# Patient Record
Sex: Female | Born: 1935
Health system: Southern US, Community
[De-identification: ages and names within clinical notes are randomized; demographics above are authoritative.]

## PROBLEM LIST (undated history)

## (undated) DIAGNOSIS — R2689 Other abnormalities of gait and mobility: Secondary | ICD-10-CM

## (undated) DIAGNOSIS — K219 Gastro-esophageal reflux disease without esophagitis: Secondary | ICD-10-CM

## (undated) DIAGNOSIS — Z5181 Encounter for therapeutic drug level monitoring: Secondary | ICD-10-CM

## (undated) DIAGNOSIS — M1990 Unspecified osteoarthritis, unspecified site: Secondary | ICD-10-CM

## (undated) DIAGNOSIS — Z950 Presence of cardiac pacemaker: Secondary | ICD-10-CM

## (undated) DIAGNOSIS — I959 Hypotension, unspecified: Secondary | ICD-10-CM

## (undated) DIAGNOSIS — R0600 Dyspnea, unspecified: Secondary | ICD-10-CM

## (undated) DIAGNOSIS — I480 Paroxysmal atrial fibrillation: Secondary | ICD-10-CM

## (undated) DIAGNOSIS — I2510 Atherosclerotic heart disease of native coronary artery without angina pectoris: Secondary | ICD-10-CM

## (undated) DIAGNOSIS — I5032 Chronic diastolic (congestive) heart failure: Secondary | ICD-10-CM

## (undated) DIAGNOSIS — I10 Essential (primary) hypertension: Secondary | ICD-10-CM

## (undated) DIAGNOSIS — Z7901 Long term (current) use of anticoagulants: Secondary | ICD-10-CM

## (undated) DIAGNOSIS — E785 Hyperlipidemia, unspecified: Secondary | ICD-10-CM

## (undated) DIAGNOSIS — E669 Obesity, unspecified: Secondary | ICD-10-CM

## (undated) DIAGNOSIS — I639 Cerebral infarction, unspecified: Secondary | ICD-10-CM

## (undated) HISTORY — DX: Cerebral infarction, unspecified: I63.9

## (undated) HISTORY — PX: SPLENECTOMY: SUR1306

## (undated) HISTORY — DX: Dyspnea, unspecified: R06.00

## (undated) HISTORY — DX: Obesity, unspecified: E66.9

## (undated) HISTORY — DX: Essential (primary) hypertension: I10

## (undated) HISTORY — PX: ABDOMINAL HYSTERECTOMY: SHX81

## (undated) HISTORY — PX: INSERT / REPLACE / REMOVE PACEMAKER: SUR710

## (undated) HISTORY — PX: CARDIAC CATHETERIZATION: SHX172

## (undated) HISTORY — DX: Presence of cardiac pacemaker: Z95.0

## (undated) HISTORY — DX: Gastro-esophageal reflux disease without esophagitis: K21.9

## (undated) HISTORY — PX: BREAST LUMPECTOMY: SHX2

## (undated) HISTORY — DX: Hyperlipidemia, unspecified: E78.5

## (undated) HISTORY — DX: Paroxysmal atrial fibrillation: I48.0

---

## 1972-02-01 HISTORY — PX: TOTAL ABDOMINAL HYSTERECTOMY W/ BILATERAL SALPINGOOPHORECTOMY: SHX83

## 1989-01-31 HISTORY — PX: COLECTOMY: SHX59

## 2000-04-11 ENCOUNTER — Encounter: Admission: RE | Admit: 2000-04-11 | Discharge: 2000-04-11 | Payer: Self-pay | Admitting: *Deleted

## 2000-04-11 ENCOUNTER — Encounter: Payer: Self-pay | Admitting: *Deleted

## 2000-05-01 ENCOUNTER — Inpatient Hospital Stay (HOSPITAL_COMMUNITY): Admission: EM | Admit: 2000-05-01 | Discharge: 2000-05-02 | Payer: Self-pay | Admitting: Emergency Medicine

## 2000-05-01 ENCOUNTER — Encounter: Payer: Self-pay | Admitting: Emergency Medicine

## 2000-05-10 ENCOUNTER — Encounter: Admission: RE | Admit: 2000-05-10 | Discharge: 2000-05-10 | Payer: Self-pay | Admitting: *Deleted

## 2000-05-10 ENCOUNTER — Encounter: Payer: Self-pay | Admitting: *Deleted

## 2000-08-17 ENCOUNTER — Encounter: Payer: Self-pay | Admitting: *Deleted

## 2000-08-17 ENCOUNTER — Ambulatory Visit (HOSPITAL_COMMUNITY): Admission: RE | Admit: 2000-08-17 | Discharge: 2000-08-17 | Payer: Self-pay | Admitting: *Deleted

## 2001-02-28 ENCOUNTER — Ambulatory Visit (HOSPITAL_COMMUNITY): Admission: RE | Admit: 2001-02-28 | Discharge: 2001-02-28 | Payer: Self-pay | Admitting: *Deleted

## 2001-02-28 ENCOUNTER — Encounter (INDEPENDENT_AMBULATORY_CARE_PROVIDER_SITE_OTHER): Payer: Self-pay | Admitting: Specialist

## 2001-03-08 ENCOUNTER — Encounter: Payer: Self-pay | Admitting: Ophthalmology

## 2001-03-13 ENCOUNTER — Ambulatory Visit (HOSPITAL_COMMUNITY): Admission: RE | Admit: 2001-03-13 | Discharge: 2001-03-14 | Payer: Self-pay | Admitting: Ophthalmology

## 2002-04-22 ENCOUNTER — Inpatient Hospital Stay (HOSPITAL_COMMUNITY): Admission: EM | Admit: 2002-04-22 | Discharge: 2002-04-23 | Payer: Self-pay | Admitting: Emergency Medicine

## 2002-04-22 ENCOUNTER — Encounter: Payer: Self-pay | Admitting: Emergency Medicine

## 2003-04-03 ENCOUNTER — Inpatient Hospital Stay (HOSPITAL_COMMUNITY): Admission: AC | Admit: 2003-04-03 | Discharge: 2003-04-15 | Payer: Self-pay

## 2003-04-03 ENCOUNTER — Encounter (INDEPENDENT_AMBULATORY_CARE_PROVIDER_SITE_OTHER): Payer: Self-pay | Admitting: Specialist

## 2003-06-26 ENCOUNTER — Emergency Department (HOSPITAL_COMMUNITY): Admission: EM | Admit: 2003-06-26 | Discharge: 2003-06-26 | Payer: Self-pay | Admitting: *Deleted

## 2004-05-13 ENCOUNTER — Emergency Department (HOSPITAL_COMMUNITY): Admission: EM | Admit: 2004-05-13 | Discharge: 2004-05-13 | Payer: Self-pay | Admitting: Emergency Medicine

## 2007-10-04 ENCOUNTER — Ambulatory Visit: Payer: Self-pay | Admitting: Cardiovascular Disease

## 2007-10-05 ENCOUNTER — Inpatient Hospital Stay (HOSPITAL_COMMUNITY): Admission: EM | Admit: 2007-10-05 | Discharge: 2007-10-06 | Payer: Self-pay | Admitting: Emergency Medicine

## 2007-10-09 ENCOUNTER — Ambulatory Visit: Payer: Self-pay | Admitting: Cardiology

## 2007-10-09 ENCOUNTER — Ambulatory Visit: Payer: Self-pay | Admitting: Internal Medicine

## 2007-10-12 ENCOUNTER — Ambulatory Visit: Payer: Self-pay | Admitting: Internal Medicine

## 2007-10-24 ENCOUNTER — Ambulatory Visit: Payer: Self-pay | Admitting: Cardiology

## 2007-10-24 LAB — CONVERTED CEMR LAB
CO2: 30 meq/L (ref 19–32)
Calcium: 10.1 mg/dL (ref 8.4–10.5)
Creatinine, Ser: 1 mg/dL (ref 0.4–1.2)
GFR calc Af Amer: 68 mL/min
GFR calc non Af Amer: 56 mL/min
Sodium: 141 meq/L (ref 135–145)

## 2007-12-11 ENCOUNTER — Ambulatory Visit: Payer: Self-pay | Admitting: Internal Medicine

## 2007-12-19 ENCOUNTER — Ambulatory Visit: Payer: Self-pay

## 2008-06-04 ENCOUNTER — Encounter: Payer: Self-pay | Admitting: Internal Medicine

## 2008-06-04 ENCOUNTER — Ambulatory Visit: Payer: Self-pay | Admitting: Internal Medicine

## 2008-06-04 DIAGNOSIS — I482 Chronic atrial fibrillation, unspecified: Secondary | ICD-10-CM | POA: Insufficient documentation

## 2008-06-04 DIAGNOSIS — R0602 Shortness of breath: Secondary | ICD-10-CM | POA: Insufficient documentation

## 2008-06-04 DIAGNOSIS — E785 Hyperlipidemia, unspecified: Secondary | ICD-10-CM

## 2008-06-04 DIAGNOSIS — I10 Essential (primary) hypertension: Secondary | ICD-10-CM

## 2008-06-10 ENCOUNTER — Ambulatory Visit: Payer: Self-pay | Admitting: Internal Medicine

## 2008-06-23 ENCOUNTER — Encounter: Payer: Self-pay | Admitting: Internal Medicine

## 2008-06-23 ENCOUNTER — Ambulatory Visit: Payer: Self-pay | Admitting: Internal Medicine

## 2008-07-11 ENCOUNTER — Ambulatory Visit: Payer: Self-pay | Admitting: Internal Medicine

## 2008-07-14 ENCOUNTER — Telehealth: Payer: Self-pay | Admitting: Internal Medicine

## 2008-07-14 LAB — CONVERTED CEMR LAB
BUN: 9 mg/dL (ref 6–23)
Chloride: 105 meq/L (ref 96–112)
INR: 2.3 — ABNORMAL HIGH (ref 0.0–1.5)
Platelets: 372 10*3/uL (ref 150–400)
Potassium: 3.8 meq/L (ref 3.5–5.3)
Prothrombin Time: 27 s — ABNORMAL HIGH (ref 11.6–15.2)
RBC: 4.9 M/uL (ref 3.87–5.11)
Sodium: 143 meq/L (ref 135–145)
WBC: 9.1 10*3/uL (ref 4.0–10.5)

## 2008-07-16 ENCOUNTER — Ambulatory Visit: Payer: Self-pay | Admitting: Internal Medicine

## 2008-07-16 ENCOUNTER — Inpatient Hospital Stay (HOSPITAL_COMMUNITY): Admission: RE | Admit: 2008-07-16 | Discharge: 2008-07-17 | Payer: Self-pay | Admitting: Internal Medicine

## 2008-07-17 ENCOUNTER — Encounter: Payer: Self-pay | Admitting: Internal Medicine

## 2008-07-21 ENCOUNTER — Encounter: Payer: Self-pay | Admitting: Internal Medicine

## 2008-07-28 ENCOUNTER — Encounter: Payer: Self-pay | Admitting: Internal Medicine

## 2008-07-28 ENCOUNTER — Ambulatory Visit: Payer: Self-pay | Admitting: Internal Medicine

## 2008-07-29 LAB — CONVERTED CEMR LAB
Hemoglobin: 14.9 g/dL (ref 12.0–15.0)
MCHC: 32.2 g/dL (ref 30.0–36.0)
MCV: 100 fL (ref 78.0–100.0)
RBC: 4.63 M/uL (ref 3.87–5.11)
WBC: 12.3 10*3/uL — ABNORMAL HIGH (ref 4.0–10.5)

## 2008-10-20 ENCOUNTER — Ambulatory Visit: Payer: Self-pay | Admitting: Internal Medicine

## 2008-10-20 DIAGNOSIS — Z950 Presence of cardiac pacemaker: Secondary | ICD-10-CM | POA: Insufficient documentation

## 2008-10-20 DIAGNOSIS — T827XXA Infection and inflammatory reaction due to other cardiac and vascular devices, implants and grafts, initial encounter: Secondary | ICD-10-CM

## 2008-11-10 ENCOUNTER — Ambulatory Visit: Payer: Self-pay | Admitting: Internal Medicine

## 2009-02-03 ENCOUNTER — Ambulatory Visit: Payer: Self-pay | Admitting: Internal Medicine

## 2009-02-03 DIAGNOSIS — I495 Sick sinus syndrome: Secondary | ICD-10-CM

## 2009-02-03 DIAGNOSIS — M25519 Pain in unspecified shoulder: Secondary | ICD-10-CM

## 2009-07-06 ENCOUNTER — Telehealth: Payer: Self-pay | Admitting: Internal Medicine

## 2009-07-07 ENCOUNTER — Ambulatory Visit: Payer: Self-pay | Admitting: Internal Medicine

## 2009-07-07 DIAGNOSIS — R072 Precordial pain: Secondary | ICD-10-CM

## 2009-07-07 DIAGNOSIS — I6350 Cerebral infarction due to unspecified occlusion or stenosis of unspecified cerebral artery: Secondary | ICD-10-CM | POA: Insufficient documentation

## 2009-07-09 ENCOUNTER — Telehealth: Payer: Self-pay | Admitting: Internal Medicine

## 2009-07-12 ENCOUNTER — Emergency Department (HOSPITAL_COMMUNITY): Admission: EM | Admit: 2009-07-12 | Discharge: 2009-07-12 | Payer: Self-pay | Admitting: Emergency Medicine

## 2009-07-20 ENCOUNTER — Telehealth: Payer: Self-pay | Admitting: Internal Medicine

## 2009-07-30 ENCOUNTER — Ambulatory Visit: Payer: Self-pay

## 2009-07-30 ENCOUNTER — Ambulatory Visit (HOSPITAL_COMMUNITY): Admission: RE | Admit: 2009-07-30 | Discharge: 2009-07-30 | Payer: Self-pay | Admitting: Internal Medicine

## 2009-07-30 ENCOUNTER — Encounter: Payer: Self-pay | Admitting: Internal Medicine

## 2009-07-30 ENCOUNTER — Ambulatory Visit: Payer: Self-pay | Admitting: Cardiology

## 2009-08-06 ENCOUNTER — Encounter: Payer: Self-pay | Admitting: Internal Medicine

## 2010-01-06 ENCOUNTER — Encounter (INDEPENDENT_AMBULATORY_CARE_PROVIDER_SITE_OTHER): Payer: Self-pay | Admitting: *Deleted

## 2010-02-22 ENCOUNTER — Ambulatory Visit: Admit: 2010-02-22 | Payer: Self-pay | Admitting: Internal Medicine

## 2010-03-02 NOTE — Cardiovascular Report (Signed)
Summary: Office Note   Office Note   Imported By: Roderic Ovens 07/14/2009 13:25:29  _____________________________________________________________________  External Attachment:    Type:   Image     Comment:   External Document

## 2010-03-02 NOTE — Assessment & Plan Note (Signed)
Summary: rov   Primary Provider:  Formerly Joetta Manners  CC:  rov/ pt reports SOB and that she has been having pain in the left side of her head.  She lost vision a couple weeks ago and the CT showed a "possible stroke".  History of Present Illness: Ms. Agent is a delightful 77 year old woman with a history of syncope in the setting of atrial fibrillation with probable tachybrady syndrome s/p pacemaker placement in 2010, chronic dyspnea as well as hypertension and gastroesophageal reflux disease. She returns for f/u.  Last year we decreased her bisprolol due to resting breadycardia and ordered a Myoview to further evaluate her chronic dyspnea. Myoview low risk. Had event monitor 6 months ago with PACs but no significant arrhythmias or pauses.   Recently had an episode of severe dizziness and decreased vision.  Saw PCP who did CT of brain. Was told she had had a stroke (did not say anything about timing). Referred for MRI but couldn't get it due to pacemaker. Had appointment with Neurology but she cancelled. Has not had carotid u/s. Still with occasional HAs. Says L arm always weaker than her right due to "pulled muscle: in her back a long time ago.   Rare episode of CP. Continues with chronic severe dyspnea asking for a scooter.  Current Medications (verified): 1)  Klor-Con M20 20 Meq Cr-Tabs (Potassium Chloride Crys Cr) .[name].. 1 By Mouth Daily As Needed 2)  Aspirin 81 Mg Tbec (Aspirin) .... Take One Tablet By Mouth Every Other Day When Remember 3)  Simvastatin 40 Mg Tabs (Simvastatin) .... Take One Tablet By Mouth Daily At Bedtime 4)  Bisoprolol Fumarate 5 Mg Tabs (Bisoprolol Fumarate) .[name].. 1 By Mouth Daily 5)  Lisinopril-Hydrochlorothiazide 10-12.5 Mg Tabs (Lisinopril-Hydrochlorothiazide) .[name].. 1 By Mouth Once Daily 6)  Warfarin Sodium 4 Mg Tabs (Warfarin Sodium) .... Use As Directed By Anticoagulation Clinic 7)  Fish Oil 1000 Mg Caps (Omega-3 Fatty Acids) .[name].. 1 By Mouth Once in  While  Allergies (verified): No Known Drug Allergies  Past History:  Past Medical History: Last updated: 06/04/2008 1. Paroxysmal atrial fibrillation c/b tachy-brady syndrome 2. HTN 3. Hyperlipidemia 4. GERD 5. CP with cath 2002 showing mild non-obstuctive CAD 6. Osteoporosis 7. Chronic dyspnea    --Myoview 11/09. EF 81% probable soft-tissue attenuation in anterior and lat walls.         can't exclude ischemia. 8. Obesity    Review of Systems       As per HPI and past medical history; otherwise all systems negative.   Vital Signs:  Patient profile:   77 year old female Height:      64 inches Weight:      189 pounds BMI:     32.56 Pulse rate:   60 / minute Pulse rhythm:   regular BP sitting:   132 / 76  (left arm) Cuff size:   regular  Vitals Entered By: Judithe Modest CMA (July 07, 2009 10:58 AM)  Physical Exam  General:  Elderly. +diffuse  tremor. No acute distress. HEENT: normal except for scar from skin cancer removal ont he bridge of her nose Neck: supple. no JVD. Carotids 2+ bilat; no bruits. No lymphadenopathy or thryomegaly appreciated. Cor: PMI nondisplaced. Regular rate & rhythm. No rubs, gallops, murmur. Back + scoliosis/kyphosis. Lungs: clear Abdomen: soft, nontender, nondistended.  Good bowel sounds. Extremities: no cyanosis, clubbing, rash, edema Neuro: alert & orientedx3, cranial nerves grossly intact. moves all 4 extremities w/o difficulty mildly weak grip in  L arm. no drift. affect pleasant    PPM Specifications Following MD:  Sherryl Manges, MD     PPM Vendor:  St Jude     PPM Model Number:  [id number]     PPM Serial Number:  [id number] PPM DOI:  07/16/2008     PPM Implanting MD:  Sherryl Manges, MD  Lead 1    Location: RA     DOI: 07/16/2008     Model #: [id number]     Serial #: [id number]     Status: active Lead 2    Location: RV     DOI: 07/16/2008     Model #: 1948     Serial #: [id number]     Status: active  Magnet Response Rate:  BOL 100 ERI   85    PPM Follow Up Battery Voltage:  2.98 V     Battery Est. Longevity:  9.7-11.65yrs     Pacer Dependent:  No       PPM Device Measurements Atrium  Amplitude: 2.9 mV, Impedance: 410 ohms, Threshold: 1.125 V at 0.4 msec Right Ventricle  Amplitude: 12.0 mV, Impedance: 700 ohms, Threshold: 0.75 V at 0.4 msec  Episodes MS Episodes:  20     Percent Mode Switch:  <1%     Coumadin:  Yes Ventricular High Rate:  0     Atrial Pacing:  65%     Ventricular Pacing:  <1%  Parameters Mode:  DDDR     Lower Rate Limit:  60     Upper Rate Limit:  120 Paced AV Delay:  200     Sensed AV Delay:  150 Tech Comments:  PT SEEING DR Zael Shuman--NORMAL DEVICE FUNCTION.  HISTOGRAMS FLAT--TURNED RATE RESPONSE ON DUE TO PT COMPLAINING OF SOB AND FATIGUE W/ACTIVITY.  LONGEST MODE SWITCH WAS 6 HRS IN DECEMBER.  LAST MODE SWITCH 06-27-09. Vella Kohler  July 07, 2009 11:31 AM  Impression & Recommendations:  Problem # 1:  CVA (ICD-434.91) Will check carotid u/s and arrange evaluation with [facility]. Also check echo.   Problem # 2:  ATRIAL FIBRILLATION (ICD-427.31) Continue coumadin. Has pacer in place for SSS.  Problem # 3:  CHEST PAIN-PRECORDIAL (ICD-786.51) Atypical. Recent Myoview reassuring. no further w/u at this time.  Problem # 4:  DYSPNEA (ICD-786.05) Assessment: Deteriorated Chronic. Severe. No clear reversible process. She is atrial pacing pacing on her pacer 65% of the time and we turned on rate response.   She is asking about an electirc scooter and given her degree of limitation I do think this is reasonable to help her mobility and preserve QOL.  Other Orders: EKG w/ Interpretation (93000) Neurology Referral (Neuro) Carotid Duplex (Carotid Duplex) Echocardiogram (Echo)  Patient Instructions: 1)  Your physician wants you to follow-up in:   9 MONTHS  You will receive a reminder letter in the mail two months in advance. If you don't receive a letter, please call our office to  schedule the follow-up appointment. 2)  You have been referred to [facility] 3)  Your physician has requested that you have a carotid duplex. This test is an ultrasound of the carotid arteries in your neck. It looks at blood flow through these arteries that supply the brain with blood. Allow one hour for this exam. There are no restrictions or special instructions. 4)  Your physician has requested that you have an echocardiogram.  Echocardiography is a painless test that uses sound waves to create images of your heart. It  provides your doctor with information about the size and shape of your heart and how well your heart's chambers and valves are working.  This procedure takes approximately one hour. There are no restrictions for this procedure.

## 2010-03-02 NOTE — Assessment & Plan Note (Signed)
Summary: f34m   Visit Type:  Follow-up Primary Provider:  Joetta Manners  CC:  SOB and chest pain on occasion.  History of Present Illness: [name] is a delightful 77 year old woman with a history of syncope in the setting of atrial fibrillation with probable tachybrady syndrome,.  she is status post pacemaker implantation for chromotropic incompetence. This was accomplished in June. About a month later apparently she had some drainage from the site. She went to go see her primary care physician Dr. Carolyne Fiscal who put her on an antibiotic with subsequent resolution. Her granddaughter, [name]  of EP fame, looked at located apparently. We saw her about 4 weeks ago that  is about 5 weeks after the above. The wound has continued to heal there has been no further drainage.  Pacemaker site a little sore when she pushes on it. No drainage. No fevers or chiils. Chronic dyspnea which she says is improved since having her pacer. Main complaint is pulled muscle in L shoulder and back. Has had chronic problem with injury to her shoulder 30 years ago and it occasionally flares up. No palpitations, presyncope and syncope. No bleeding with coumadin.  Medications Prior to Update: 1)  Klor-Con M20 20 Meq Cr-Tabs (Potassium Chloride Crys Cr) .[name].. 1 By Mouth Daily As Needed 2)  Aspirin 81 Mg Tbec (Aspirin) .... Take One Tablet By Mouth Every Other Day When Remember 3)  Simvastatin 40 Mg Tabs (Simvastatin) .... Take One Tablet By Mouth Daily At Bedtime 4)  Bisoprolol Fumarate 5 Mg Tabs (Bisoprolol Fumarate) .[name].. 1 By Mouth Daily 5)  Lisinopril-Hydrochlorothiazide 10-12.5 Mg Tabs (Lisinopril-Hydrochlorothiazide) .[name].. 1 By Mouth Once Daily 6)  Warfarin Sodium 4 Mg Tabs (Warfarin Sodium) .... Use As Directed By Anticoagulation Clinic 7)  Fish Oil 1000 Mg Caps (Omega-3 Fatty Acids) .[name].. 1 By Mouth Once in While  Current Medications (verified): 1)  Klor-Con M20 20 Meq Cr-Tabs (Potassium Chloride Crys Cr) .[name].. 1 By  Mouth Daily As Needed 2)  Aspirin 81 Mg Tbec (Aspirin) .... Take One Tablet By Mouth Every Other Day When Remember 3)  Simvastatin 40 Mg Tabs (Simvastatin) .... Take One Tablet By Mouth Daily At Bedtime 4)  Bisoprolol Fumarate 5 Mg Tabs (Bisoprolol Fumarate) .[name].. 1 By Mouth Daily 5)  Lisinopril-Hydrochlorothiazide 10-12.5 Mg Tabs (Lisinopril-Hydrochlorothiazide) .[name].. 1 By Mouth Once Daily 6)  Warfarin Sodium 4 Mg Tabs (Warfarin Sodium) .... Use As Directed By Anticoagulation Clinic 7)  Fish Oil 1000 Mg Caps (Omega-3 Fatty Acids) .[name].. 1 By Mouth Once in While  Allergies (verified): No Known Drug Allergies  Past History:  Past Medical History: Last updated: 06/04/2008 1. Paroxysmal atrial fibrillation c/b tachy-brady syndrome 2. HTN 3. Hyperlipidemia 4. GERD 5. CP with cath 2002 showing mild non-obstuctive CAD 6. Osteoporosis 7. Chronic dyspnea    --Myoview 11/09. EF 81% probable soft-tissue attenuation in anterior and lat walls.         can't exclude ischemia. 8. Obesity    Review of Systems       As per HPI and past medical history; otherwise all systems negative.   Vital Signs:  Patient profile:   77 year old female Weight:      188.50 pounds Pulse rate:   60 / minute Pulse rhythm:   regular BP sitting:   124 / 70  (left arm) Cuff size:   regular  Vitals Entered By: Marilyn Cross, RN, BSN (February 03, 2009 2:02 PM)  Physical Exam  General:  Elderly. + tremor.  No acute distress. HEENT: normal Neck: supple. no JVD. Carotids 2+ bilat; no bruits. No lymphadenopathy or thryomegaly appreciated. Cor: PMI nondisplaced. Regular rate & rhythm. No rubs, gallops, murmur. Back + scoliosis. tender over left scapula Lungs: clear Abdomen: soft, nontender, nondistended.  Good bowel sounds. Extremities: no cyanosis, clubbing, rash, edema Neuro: alert & orientedx3, cranial nerves grossly intact. moves all 4 extremities w/o difficulty. affect pleasant    PPM  Specifications Following MD:  Sherryl Manges, MD     PPM Vendor:  St Jude     PPM Model Number:  [id number]     PPM Serial Number:  [id number] PPM DOI:  07/16/2008     PPM Implanting MD:  Sherryl Manges, MD  Lead 1    Location: RA     DOI: 07/16/2008     Model #: [id number]     Serial #: [id number]     Status: active Lead 2    Location: RV     DOI: 07/16/2008     Model #: 1948     Serial #: [id number]     Status: active  Magnet Response Rate:  BOL 100 ERI  85    PPM Follow Up Pacer Dependent:  No      Episodes Coumadin:  Yes  Parameters Mode:  DDD     Lower Rate Limit:  60     Upper Rate Limit:  120 Paced AV Delay:  200     Sensed AV Delay:  150  Impression & Recommendations:  Problem # 1:  SICK SINUS SYNDROME (ICD-427.81) Doing well s/p pacer. No signs of recurrent AF. Continue coumadin.  Problem # 2:  INF&INFLAM REACT DUE CARD DEVICE IMPLANT&GRAFT (ICD-996.61) Site looks good. no signs infection. Told not not to manipulate.  Problem # 3:  SHOULDER PAIN, LEFT, CHRONIC (ICD-719.41) Likely muscuolskeletal. Advised her to take extra strenght tylenol and f/u with PCP if continues. Does not appear cardiac.

## 2010-03-02 NOTE — Letter (Signed)
Summary: Appointment - Missed  [facility], Main Office  [address]   [place], [place] [address]   Phone: [phone]  Fax: [phone]     January 06, 2010 MRN: [id number]   [facility] [address] [place], [place]  [address]   Dear Ms. BENEDICT,  Our records indicate you missed your appointment on 12-28-09 with [facility] .   It is very important that we reach you to reschedule this appointment. We look forward to participating in your health care needs. Please contact us at the number listed above at your earliest convenience to reschedule this appointment.     Sincerely,    Glass blower/designer

## 2010-03-02 NOTE — Progress Notes (Signed)
Summary: checking on paperwork for power wheelchair  Phone Note From Other Clinic   Summary of Call: checking on paperwork for power wheel chair is it ready?-patrick w/[facility] sent it in 6-13 Initial call taken by: Glynda Jaeger,  July 20, 2009 3:28 PM  Follow-up for Phone Call        called [facility] at [phone] advised them we had sent forms to pcp and gave her their phone # Meredith Staggers, RN  July 20, 2009 4:05 PM

## 2010-03-02 NOTE — Letter (Signed)
Summary: [facility] Update   [facility] Update   Imported By: Roderic Ovens 09/11/2009 16:11:25  _____________________________________________________________________  External Attachment:    Type:   Image     Comment:   External Document

## 2010-03-02 NOTE — Progress Notes (Signed)
Summary: Stroke and SOB  Phone Note Call from Patient   Caller: Patient Summary of Call: Pt had called [place] office for appt w/Dr Bensimhon and was told they didn't have anything to call here for sooner appt, pt states she had an episode on a Sat night where she was very dizzy and had a headache she saw her pcp on Mon and had a ct scan and was told she had had a stroke that was 3 weeks ago, she states since then she has felt better but slight pain in her head off/on and now she has dev. SOB adn can't hardly walk any distance, sch appt w/Dr Bensimhon for tom at 11:45 Meredith Staggers, RN  July 06, 2009 9:41 AM

## 2010-03-02 NOTE — Progress Notes (Signed)
Summary: power wheelchair paperwork  Phone Note Other Incoming Call back at [phone] ext [phone]   Caller: Jason/ [facility] Summary of Call: request to know if we have got paperwork for power wheelchair Initial call taken by: Migdalia Dk,  July 09, 2009 9:18 AM  Follow-up for Phone Call        have not recieved yet Meredith Staggers, RN  July 09, 2009 5:41 PM   will resend, Migdalia Dk  July 13, 2009 4:08 PM   per Dr Gala Romney he does not want to order power chair due to large work up, will arrange for pt to get a pcp to complete Meredith Staggers, RN  July 17, 2009 4:53 PM   Additional Follow-up for Phone Call Additional follow up Details #1::        pt is aware form faxed to [facility], RN  July 17, 2009 5:13 PM

## 2010-03-22 ENCOUNTER — Encounter: Payer: Self-pay | Admitting: Internal Medicine

## 2010-03-22 ENCOUNTER — Encounter (INDEPENDENT_AMBULATORY_CARE_PROVIDER_SITE_OTHER): Payer: Medicare Other | Admitting: Internal Medicine

## 2010-03-22 DIAGNOSIS — I498 Other specified cardiac arrhythmias: Secondary | ICD-10-CM

## 2010-03-22 DIAGNOSIS — Z950 Presence of cardiac pacemaker: Secondary | ICD-10-CM

## 2010-03-22 DIAGNOSIS — I4891 Unspecified atrial fibrillation: Secondary | ICD-10-CM

## 2010-03-27 ENCOUNTER — Encounter: Payer: Self-pay | Admitting: Internal Medicine

## 2010-03-30 NOTE — Cardiovascular Report (Signed)
Summary: Office Visit   Office Visit   Imported By: Roderic Ovens 03/26/2010 09:09:33  _____________________________________________________________________  External Attachment:    Type:   Image     Comment:   External Document

## 2010-03-30 NOTE — Assessment & Plan Note (Signed)
Summary: physician clinic   Allergies: No Known Drug Allergies   PPM Specifications Following MD:  Sherryl Manges, MD     PPM Vendor:  St Jude     PPM Model Number:  [id number]     PPM Serial Number:  [id number] PPM DOI:  07/16/2008     PPM Implanting MD:  Sherryl Manges, MD  Lead 1    Location: RA     DOI: 07/16/2008     Model #: [id number]     Serial #: [id number]     Status: active Lead 2    Location: RV     DOI: 07/16/2008     Model #: 1948     Serial #: [id number]     Status: active  Magnet Response Rate:  BOL 100 ERI  85    PPM Follow Up Pacer Dependent:  No      Episodes Coumadin:  Yes  Parameters Mode:  DDDR     Lower Rate Limit:  60     Upper Rate Limit:  120 Paced AV Delay:  200     Sensed AV Delay:  150

## 2010-04-07 ENCOUNTER — Ambulatory Visit: Payer: Self-pay | Admitting: Internal Medicine

## 2010-04-19 LAB — CBC
Hemoglobin: 14.8 g/dL (ref 12.0–15.0)
MCHC: 33.8 g/dL (ref 30.0–36.0)
MCV: 97.4 fL (ref 78.0–100.0)
RBC: 4.5 MIL/uL (ref 3.87–5.11)
RDW: 13.6 % (ref 11.5–15.5)

## 2010-04-19 LAB — DIFFERENTIAL
Basophils Absolute: 0.4 10*3/uL — ABNORMAL HIGH (ref 0.0–0.1)
Basophils Relative: 3 % — ABNORMAL HIGH (ref 0–1)
Eosinophils Absolute: 0 10*3/uL (ref 0.0–0.7)
Monocytes Absolute: 1.4 10*3/uL — ABNORMAL HIGH (ref 0.1–1.0)
Monocytes Relative: 11 % (ref 3–12)
Neutrophils Relative %: 60 % (ref 43–77)

## 2010-04-19 LAB — BASIC METABOLIC PANEL
CO2: 24 mEq/L (ref 19–32)
Calcium: 9.2 mg/dL (ref 8.4–10.5)
Chloride: 108 mEq/L (ref 96–112)
Creatinine, Ser: 1.53 mg/dL — ABNORMAL HIGH (ref 0.4–1.2)
GFR calc Af Amer: 39 mL/min — ABNORMAL LOW (ref >60)
Glucose, Bld: 100 mg/dL — ABNORMAL HIGH (ref 70–99)

## 2010-04-23 NOTE — Assessment & Plan Note (Signed)
[facility]                       [facility] OFFICE NOTE  NAME:[name], [name]                       MRN:          [id number] DATE:03/22/2010                            DOB:          1923-02-07   [name] is seen today in followup for a pacemaker implanted for tachybrady syndrome.  She has been in atrial fibrillation persistently now for about 6 months.  She notes significant dyspnea on exertion and tachy palpitations with exertion.  Looking back at the old notes Dr. Gala Romney had decreased her bisoprolol from 10-5 mg a day for "bradycardia."  Medications otherwise include Zocor 40, warfarin, Prevacid, bisoprolol, lisinopril HCT.  PHYSICAL EXAMINATION:  VITAL SIGNS:  Her blood pressure is 122/81, her pulse is 93, her weight is 185. NECK:  Veins were flat.  Carotids are brisk and full. LUNGS:  Were clear. HEART:  Sounds were irregular and rapid. ABDOMEN:  Soft. EXTREMITIES:  Without edema.  Interrogation of her St. Jude pacemaker demonstrates a fibrillation wave of 0.3 and R-wave of greater than 12.  Impedance in the A was 350 and 690 in the RV.  Thresholds were done, but I do not have them in front of me and they were normal.  IMPRESSION: 1. Atrial fibrillation-persistent. 2. History of bradycardia, status post pacemaker implantation with     intercurrent down titration of her beta-blocker. 3. Symptomatic dyspnea on exertion with tachy palpitations. 4. Question of intercurrent stroke.  We will plan to increase [name]'s bisoprolol from 5-10 mg.  We will reassess her control of her heart rate which by histograms demonstrates that only 55-60% of the beats are in the first bin and 25% of beats or so are in the second bin.  We will see if we can control her symptoms with control of her heart rate, now we will plan to undertake cardioversion.    Duke Salvia, MD, [facility]    SCK/MedQ  DD: 03/22/2010  DT: 03/23/2010  Job #:  [id number]

## 2010-05-10 LAB — PROTIME-INR
INR: 1.4 (ref 0.00–1.49)
INR: 1.7 — ABNORMAL HIGH (ref 0.00–1.49)
Prothrombin Time: 17.9 seconds — ABNORMAL HIGH (ref 11.6–15.2)
Prothrombin Time: 20.8 seconds — ABNORMAL HIGH (ref 11.6–15.2)

## 2010-06-15 NOTE — H&P (Signed)
NAMEZORAIDA, [name] NO.:  [id number]   MEDICAL RECORD NO.:  [id number]          PATIENT TYPE:  INP   LOCATION:  3703                         FACILITY:  [facility]   PHYSICIAN:  Brayton El, MD    DATE OF BIRTH:  1923/08/26   DATE OF ADMISSION:  10/04/2007  DATE OF DISCHARGE:                              HISTORY & PHYSICAL   CHIEF COMPLAINT:  Palpitations.   HISTORY OF PRESENT ILLNESS:  [name] is an 77 year old white female  with a past medical history significant for mitral valve prolapse,  hypertension and hyperlipidemia.  She presented with a 1-week history of  intermittent palpitations associated with some mild dizziness and  occasional chest tightness.  The patient, for the past week, has had 1-2  episodes every other day of what feels like a rapid heartbeat associated  with some mild dizziness that terminates on its own within minutes.  Today the patient had a similar episode however, she felt that it was  more strong and she became more symptomatic in that she feels like she  almost passed out during the episode.  EMS found the patient to be in  atrial fibrillation with rapid ventricular response.  The rapid  ventricular response terminated on its own; however, a follow-up EKG has  not yet been obtained.   Other than these palpitations,  the patient states she has been in her  normal state of health.  Specifically, she denies any worsening of  dyspnea on exertion, lower extremity edema or PND.   PAST MEDICAL HISTORY:  1. Mitral prolapse.  2. Hypertension.  3. Obesity.  4. Gastroesophageal reflux disease.  5. Hyperlipidemia.  6. Osteoporosis.  7. History of pseudomembranous colitis.  8. History of lower extremity phlebitis.  9. History of bradycardia, and the patient states that several years      ago in the hospital she was told that she had a heart rate in the      40s.  10.The patient had a left heart catheterization in April 2002 that  showed mild, nonobstructive coronary disease.   SOCIAL HISTORY:  The patient does not smoke tobacco or use alcohol.  She  lives with her husband.   FAMILY HISTORY:  Negative for premature coronary disease.   ALLERGIES:  NO KNOWN DRUG ALLERGIES.   MEDICATIONS:  1. Hydrochlorothiazide 25 mg daily.  2. Zocor 20 mg at bedtime.  3. Bisoprolol 5 mg daily.   REVIEW OF SYSTEMS:  As in the HPI.  All other systems were reviewed and  are negative.   PHYSICAL EXAMINATION:  The patient has a temperature of 97.1, pulse of  60, blood pressure 115/65, respiratory rate is 16.  GENERAL:  She is in  no acute distress.  HEENT:  Normocephalic, atraumatic.  NECK:  Supple.  There is no JVD.  HEART:  Irregularly irregular; without murmur, rub or gallop.  LUNGS:  Clear to auscultation bilaterally.  ABDOMEN:  Soft, nontender, nondistended.  EXTREMITIES:  Without edema.  SKIN:  Warm and dry.  PSYCHIATRIC:  The patient is appropriate with normal levels of insight.  NEURO:  Nonfocal.   LABS:  Sodium 139, potassium 4.5, chloride 105, CO2 26, BUN 15,  creatinine 0.02, glucose 111.  White count 9.6, hemoglobin 15.6,  hematocrit 45.9, platelet count 336.  CK-MB 3.7.  Troponin is less than  0.05.  D-dimer 0.58.  Chest x-ray shows evidence of a hiatal hernia.  The EKG is poor quality as it is an EMS EKG; but it shows probable  atrial fibrillation.   ASSESSMENT:  1. Atrial fibrillation, likely paroxysmal.  2. Hypertension.  3. Questionable history of bradycardia.   PLAN:  The patient will be admitted to telemetry and ruled out for acute  myocardial infarction, although this diagnosis is highly unlikely.  We  will increase her bisoprolol from 5 mg daily to 10 mg daily, in the  hopes of improving her rate control.  She will be monitored on  telemetry.  We will place the patient on heparin, as she currently  appears to be in atrial fibrillation; with the intention of starting  Coumadin as she seems to be a  reasonable candidate for Coumadin.  (The  patient denies any falls or any history of bleeding).  It is quite  possible that this patient may eventually need a pacemaker, as she has a  questionable history of bradycardia in addition to the tachycardia that  she experiences with the atrial fibrillation.  We will also check a TSH  and a magnesium level.  The patient had an echocardiogram at her primary  care physician's office,  and the results of this should be obtained or  an echocardiogram should be repeated here.      Brayton El, MD  Electronically Signed     SGA/MEDQ  D:  10/05/2007  T:  10/05/2007  Job:  [id number]

## 2010-06-15 NOTE — Assessment & Plan Note (Signed)
[facility]                            CARDIOLOGY OFFICE NOTE   NAME:[name], [name]                       MRN:          [id number]  DATE:10/24/2007                            DOB:          September 20, 1923    PRIMARY CARDIOLOGIST:  Bevelyn Buckles. Bensimhon, MD   HISTORY OF PRESENT ILLNESS:  [name] is a very pleasant 77 year old  white female who was recently admitted to the hospital with probable  atrial fibrillation with rapid ventricular response, treated in, by EMS  in the field.  She had negative cardiac enzymes, normal TSH, and was  discharged home on Coumadin and a beta-blocker.  She also was given an  event recorder to wear.  She says she has done well since she was sent  home from the hospital until last night when she got out of the car to  walk in the [place], she felt a little bit lightheaded.  That passed, and  then when she would lay down to go to bed that night, she said she felt  her heart flutter a little bit, but that resolved quickly as well.  She  said she did not feel anything like when she came to the hospital.  Today, she is feeling fine.  She denies any chest pain or associated  shortness of breath with this.   CURRENT MEDICATIONS:  1. Bisoprolol 5 mg daily.  2. KCl 20 mEq daily.  3. Warfarin as directed.  4. Hydrochlorothiazide 25 mg daily.  5. Zocor 20 mg at bedtime.  6. Aspirin 81 mg daily.   PHYSICAL EXAMINATION:  GENERAL:  This is a pleasant 77 year old white  female, in no acute distress.  VITAL SIGNS:  Blood pressures 130/78, pulse 52, and weight 187.  NECK:  Without JVD, HJR, bruit, or thyroid enlargement.  LUNGS:  Clear anterior, posterior, and lateral.  HEART:  Regular rate and rhythm at 52 beats per minute.  Normal S1 and  S2.  No murmur, rub, bruit, thrill, or heaves noted.  ABDOMEN:  Soft  without organomegaly, masses, lesions, or abnormal tenderness.  EXTREMITIES:  Without cyanosis, clubbing, or edema.  She has  good distal  pulses.   EKG, sinus bradycardia at 48 beats per minute, nonspecific ST changes.  We did obtain her [employer] report from her event recorder. Last night,  she had some PVCs, PACs.  She did have some artifact, but there was no  evidence of recurrent atrial fibrillation in any of the recordings that  we have obtained since she has been wearing it.   IMPRESSION:  1. Paroxysmal atrial fibrillation converted to normal sinus rhythm.  2. Hypertension.  3. Mitral valve prolapse.  4. Tachybrady syndrome with near syncope.  Bisoprolol was decreased in      the hospital to 5 mg daily.  5. Gastroesophageal reflux disease.  6. Mild nonobstructive coronary artery disease on catheterization in      2002 according to the patient.   PLAN:  The patient overall is doing well.  She has not had any recurrent  atrial fibrillation according to her event recorder  and she has an  appointment to see Dr. Gala Romney back next month.      Jacolyn Reedy, PA-C  Electronically Signed      Luis Abed, MD, [facility]  Electronically Signed   ML/MedQ  DD: 10/24/2007  DT: 10/25/2007  Job #: [id number]

## 2010-06-15 NOTE — Discharge Summary (Signed)
[name], [name] NO.:  [id number]   MEDICAL RECORD NO.:  [id number]          PATIENT TYPE:  INP   LOCATION:  3738                         FACILITY:  [facility]   PHYSICIAN:  Duke Salvia, MD, FACCDATE OF BIRTH:  05-25-1923   DATE OF ADMISSION:  07/16/2008  DATE OF DISCHARGE:  07/17/2008                               DISCHARGE SUMMARY   Time for this dictation greater than 35 minutes.   This patient has no known drug allergies.   FINAL DIAGNOSIS:  Discharging day one, status post implant of a St. Jude  Accent DR dual-chamber pacemaker for sinus node dysfunction.  It is set  at DDD mode.   SECONDARY DIAGNOSES:  1. History of atrial fibrillation.  2. Paroxysmal nocturnal dyspnea.  3. Atypical chest pain.  4. Left heart catheterization 2002, nonobstructive coronary artery      disease.  5. Myoview study 2009 low risk.  6. Hypertension.  7. Dyslipidemia.  8. Osteoporosis.  9. Obesity.  10.Diverticulitis.   PROCEDURE:  Once again July 16, 2008, implant of a St. Jude Accent dual-  chamber pacemaker, Dr. Sherryl Manges.  The patient's x-ray shows the  leads are in appropriate position with device has been interrogated on  postprocedure day #1.  All values are within normal limits.  The patient  has no hematoma at the incision site and her pain is controlled with  oral analgesia.   BRIEF HISTORY:  [name] is an 77 year old female.  She has a history  of fatigue and chronotropic incompetence.  She has chronic bradycardia  as well.  She also has a history of atrial fibrillation with rapid  ventricular rates, which requires rate control medications.  However, at  times, this will cause her to go too slow.  Therefore, the patient has a  diagnosis of tachybrady syndrome.   As regard, her quality of life-limiting symptoms are dyspnea with  exertion and overwhelming fatigue by the end of the day.  She has  chronic peripheral edema, some nocturnal dyspnea,  two-pillow orthopnea,  and she has longstanding GE reflux disease.   She has had a low-risk Myoview in the past by Dr. Jens Som in 2009.  She  has a catheterization in 2002, which showed no rate flow-limiting  coronary artery disease.  The patient was seen by Dr. Graciela Husbands at [facility] and deemed a suitable candidate for pacemaker  implantation.  This would be done at the first convenient time.  The  risks and benefits have been described to the patient.   HOSPITAL COURSE:  The patient presents electively on July 16, 2008.  She  is in no acute distress.  She is breathing well.  Her airway is II.  Her  medication list has been examined.  She had been prepped for pacemaker  implantation.  This study went well.  The patient has had no  postprocedural complications except for increased blood pressure  postoperatively.  Blood pressure medications have been held.  The  patient's blood pressure on postprocedure day #1 has rebounded after  sedation has worn off.  Laboratory studies were taken prior to this  admission at [facility], they were to be faxed to  our hospital, but I do not see them on the chart.  The patient was asked  to keep her incision dry for the next 7 days, to sponge bathe until  Wednesday, July 23, 2008.   MEDICATIONS AT DISCHARGE:  1. Simvastatin 40 mg daily at bedtime.  2. Bisoprolol 5 mg daily.  3. Naproxen 500 mg twice daily.  4. Hydrochlorothiazide 25 mg daily.  5. Coumadin 4 mg to alternate with 6 mg daily.   He follows up at [facility] in  [place].  1. Pacer Clinic Monday, July 28, 2008 at 8:30.  2. To see Dr. Graciela Husbands Monday, October 20, 2008 at 10:15.  At the time      of discharge, her INR is 1.4.  She must have been holding her      Coumadin for this study.      [name], [place]      Duke Salvia, MD, [facility]  Electronically Signed    GM/MEDQ  D:  07/17/2008  T:   07/18/2008  Job:  [id number]

## 2010-06-15 NOTE — Op Note (Signed)
[name], [name] NO.:  [id number]   MEDICAL RECORD NO.:  [id number]          PATIENT TYPE:  INP   LOCATION:  3738                         FACILITY:  [facility]   PHYSICIAN:  Duke Salvia, MD, FACCDATE OF BIRTH:  09/12/1923   DATE OF PROCEDURE:  07/16/2008  DATE OF DISCHARGE:                               OPERATIVE REPORT   SURGEON:  Duke Salvia, MD, [facility]   PREOPERATIVE DIAGNOSES:  1. Chronotropic incompetence.  2. Resting bradycardia.   POSTOPERATIVE DIAGNOSES:  1. Chronotropic incompetence.  2. Resting bradycardia.   PROCEDURE:  Dual-chamber pacemaker implantation.   Following obtaining informed consent, the patient was brought to the  Electrophysiology Laboratory and placed on the fluoroscopic table in a  supine position.  After routine prep and drape of the left upper chest,  lidocaine was infiltrated in the prepectoral subclavicular region.  An  incision was made and carried down to the layer of the prepectoral  fascia using electrocautery and sharp dissection.  A pocket was formed  similarly.  Hemostasis was obtained.   Thereafter, attention was turned to gain access to the extrathoracic  left subclavian vein, which was accomplished with moderate difficulty,  not specifically the cannulation but passing through the wire.  This  having been done; however, I decided to double wire it.  We then placed  sequentially two 7-French sheaths and an active-fixation ventricular  lead, serial #[id number], St. Jude [id number] and an active-fixation atrial lead  2088, serial #[id number].  Under fluoroscopic guidance, the RV lead was  manipulated to the apex with bipolar amplitude was 14 mV with a pace  impedance of 985, a threshold of 0.8 volts at 0.5 milliseconds, and the  current threshold would have been less than 0.9 MA.   The atrial lead was initially moved towards the right atrial appendage;  however, despite good amplitudes, pacing thresholds were  reportedly  moved into the lateral wall with bipolar P-wave was 4.8 with a pace  impedance of 600 ohms, threshold of 0.9 volts at 0.5 milliseconds  shortly after lead deployment.  Current threshold was 3.1 MA.  There was  no diaphragmatic pacing at 10 volts, and the current of injury was  brisk.  These leads were secured to the prepectoral fascia and then  attached to an Accent DR pulse generator, serial [id number].  Ventricular  pacing and then atrial pacing were identified.  The pocket was copiously  irrigated with antibiotic-containing saline solution.  Hemostasis was  assured, and the leads and the pulse generator were placed in the  pocket, secured to the prepectoral  fascia.  The wound was then closed in 3 layers in normal fashion.  The  wound was washed, dried, and a benzoin Steri-Strip dressing was applied.  The needle counts, sponge counts, and instrument counts were correct at  the end of the procedure according to the staff.  The patient tolerated  the procedure without apparent complication.      Duke Salvia, MD, [facility]  Electronically Signed     SCK/MEDQ  D:  07/16/2008  T:  07/17/2008  Job:  [id number] 

## 2010-06-15 NOTE — Assessment & Plan Note (Signed)
HEALTHCARE                            CARDIOLOGY OFFICE NOTE   NAME:WRIGHTJosselyn, Harkins                       MRN:          [id number]  DATE:12/11/2007                            DOB:          1924/01/19    INTERVAL HISTORY:  Ms. Yagi is a delightful 77 year old woman with a  history of atrial fibrillation with probable tachybrady syndrome, as  well as hypertension and gastroesophageal reflux disease.   She was admitted to the hospital few months ago with previous  palpitations and a presyncopal episode.  In the field, it appeared that  she had atrial fibrillation, but this spontaneously converted to sinus  rhythm and she had some bradycardia necessitating decrease of her  bisoprolol.   Since that time, she has been doing quite well.  She has worn an event  monitor which showed just some PACs, but no significant bradycardia or  pauses.  There has been no recurrent atrial fibrillation.  She states,  overall, she is feeling just fine.  She does have some chronic shortness  of breath, but this is unchanged.  She thinks this is related to her  weight.  She has not had any chest pain.   CURRENT MEDICATIONS:  1. Bisoprolol 5 mg a day.  2. Potassium 20 a day.  3. Coumadin.  4. HCTZ 25 a day.  5. Aspirin 81 a day.  6. Zocor 40 a day.   PHYSICAL EXAMINATION:  GENERAL:  She is an elderly woman in no acute  distress, ambulates around the clinic slowly without any respiratory  difficulty.  VITAL SIGNS:  Blood pressure is 120/84, heart rate is 70, and weight is  189.  HEENT:  Normal.  NECK:  Supple.  There is no JVD.  Carotids are 2+ bilaterally without  any bruits.  There is no lymphadenopathy or thyromegaly.  CARDIAC:  PMI  is nondisplaced.  She is bradycardic and regular.  No murmurs, rubs, or  gallops.  LUNGS:  Clear.  ABDOMEN:  Soft, nontender, and nondistended.  No hepatosplenomegaly.  No  bruits.  No masses.  EXTREMITIES:  Warm with no cyanosis,  clubbing, or edema.  No rash.  NEURO:  Alert and oriented x3.  Cranial nerves II-XII are intact.  Moves  all 4 extremities without difficulty.  Affect is very pleasant.   EKG shows sinus bradycardia at a rate of 47.  No ST-T wave  abnormalities.  Axis intervals are normal.   ASSESSMENT AND PLAN:  1. Atrial fibrillation with tachybrady syndrome.  She is fairly      bradycardic today, but this is asymptomatic.  I suspect the best      thing to do here is to cut back her bisoprolol down to 2.5 mg a      day.  We did once again talk about the fact that she will likely      need a pacemaker in the near future, but there is no direct      indication at this point.  2. Dyspnea.  I suspect this is related to her weight and her age,  but      she has not had a stress test in some time.  We will go ahead and      order an adenosine Myoview.  3. Hypertension, well controlled.  4. Hyperlipidemia.  It is followed by her primary care doctor.   DISPOSITION:  We will see her back in several months for followup.     Bevelyn Buckles. Bensimhon, MD  Electronically Signed    DRB/MedQ  DD: 12/11/2007  DT: 12/12/2007  Job #: [id number]

## 2010-06-15 NOTE — Discharge Summary (Signed)
[name], [name]                ACCOUNT NO.:  [id number]   MEDICAL RECORD NO.:  [id number]          PATIENT TYPE:  INP   LOCATION:  3703                         FACILITY:  [facility]   PHYSICIAN:  Bevelyn Buckles. Bensimhon, MDDATE OF BIRTH:  Jun 29, 1923   DATE OF ADMISSION:  10/04/2007  DATE OF DISCHARGE:  10/06/2007                               DISCHARGE SUMMARY   DISCHARGE DIAGNOSES:  1. Probable atrial fibrillation with rapid ventricular response in the      field per [facility].  No rhythm strips available      at this time to confirm.  The patient with normal TSH, negative      cardiac enzymes with initiation of anticoagulation therapy.  At the      time of discharge, INR is 1.1.  The patient is being discharged      home on 4 mg of Coumadin a day with a Coumadin Clinic appointment      scheduled for this coming Wednesday.  The patient to continue 4 mg      daily until then.  She is getting 5 mg today prior to discharge.  2. Tachybrady syndrome with near syncope.  We will decrease bisoprolol      to 5 mg daily from 10 mg daily.   PAST MEDICAL HISTORY:  1. Mitral prolapse.  2. Hypertension.  3. Obesity.  4. GERD.  5. Hyperlipidemia.  6. Osteoporosis.  7. History of bradycardia.  8. Cardiac catheterization in 2002 that showed mild nonobstructive      disease per the patient's report.   [name] is an 77 year old Caucasian female with past medical history  as stated above who presented with 1-week history of intermittent  palpitations associated with dizziness and occasional chest discomfort  that terminated on its own within minutes.  On day of admission, the  patient had similar episode that was more intense.  The patient  experienced presyncopal episode.  EMS was called.  The patient  reportedly found in atrial fib with rapid ventricular response,  terminated on its own; however, rhythm strips are not available to  confirm this.  A 12-lead EKG checked on  arrival to [facility] showed  sinus rhythm at a rate of 60.  The patient felt to be a candidate for  anticoagulation therapy and initiated on October 05, 2007, the patient  received 4 mg.  She is receiving 5 mg prior to discharge on October 06, 2007, and will go home on 4 mg daily.  Heparin drip has been  discontinued.  Bisoprolol 5 mg daily, KCl 20 mEq daily, warfarin as  described above.  The patient to continue her hydrochlorothiazide 25 mg  daily and Zocor 20 mg at bedtime.  The patient will need followup with  Dr. Gala Romney in the next 3-4 weeks.  Office will call the patient with  date and time.  The patient will be seen in the Coumadin Clinic as a new  patient this Wednesday.  Office will call the patient with time.  The  patient has been given the Coumadin educational information prior  to  discharge and prescriptions for her warfarin, bisoprolol, and potassium.  She will need a followup BMET when she sees Dr. Gala Romney also.   DURATION OF DISCHARGE ENCOUNTER:  Over 30 minutes secondary to  paperwork.      Dorian Pod, ACNP      Bevelyn Buckles. Bensimhon, MD  Electronically Signed    MB/MEDQ  D:  10/06/2007  T:  10/06/2007  Job:  [id number]

## 2010-06-15 NOTE — Letter (Signed)
Jun 23, 2008    [name]. Bensimhon, MD  [address]  [place], [place] [address]   RE:  [name], [name]  MRN:  [id number]  /  DOB:  [birth year]   Dear [name],   It was a pleasure seeing [name] today at your request for  consideration of pacemaker implantation for chronic bradycardia and  demonstrated chronotropic incompetence.   She is a 77 year old woman who is the grandmother of [name], who  use to work in the EP lab, who has been struggling with chronic dyspnea  on exertion and fatigue, which is particularly worse at the end of the  day.  She has had as noted chronic bradycardia.  Her bisoprolol has been  gradually decreased, but has been maintained because she also has a  history of paroxysmal atrial fibrillation with rapid ventricular rates  associated with syncope and recurrent lightheadedness.   Her limiting symptoms are notable at the end of the day manifested by  dyspnea and just overwhelming fatigue.  She has chronic peripheral  edema, some nocturnal dyspnea, and two-pillow orthopnea, although she  attributes the latter to longstanding GE reflux disease.   She has a history of atypical chest pains for which she takes  nitroglycerin.  Evaluation of her old chart demonstrated a  catheterization in 2002 that was negative and a Myoview that was  described as low risk by [name] in 2009.   PAST MEDICAL HISTORY:  In addition to the above is notable for:  1. Hypertension.  2. Dyslipidemia.  3. Osteoporosis.  4. Obesity.  5. Diverticulitis.   PAST SURGICAL HISTORY:  Notable for partial colectomy following her  diverticulitis, traumatic splenectomy, hysterectomy, and eye surgery.   REVIEW OF SYSTEMS:  Apart from the above is broadly negative.   SOCIAL HISTORY:  She is married.  Her husband is quite ill and is a big  bird and there are lots of burdens here.  She has 3 children, 2  grandchildren, and 10 great-grandchildren.  She does not use cigarettes,  alcohol, or recreational drugs.   FAMILY HISTORY:  Noncontributory.   PHYSICAL EXAMINATION:  GENERAL:  She is an elderly Caucasian female appearing her stated age of  53.  VITAL SIGNS:  Her blood pressure was 132/72.  Her pulse was 46.  Her  weight was 191, which is relatively stable.  HEENT:  No icterus or xanthomata.  NECK:  Veins were 8 cm.  Her carotids were brisk and full bilateral  without bruits.  She had a marked kyphosis.  LUNGS:  Clear.HEART:  Sounds were regular without murmurs or gallops.  ABDOMEN:  Soft with active bowel sounds.  EXTREMITIES:  Femoral pulses were 2+.  Distal pulses were intact.  There  is no clubbing, cyanosis, or edema.  NEUROLOGIC:  Grossly normal.SKIN:  Warm and dry.   Electrocardiogram dated from your visit on Jun 04, 2008, demonstrated  sinus rhythm at 50 with intervals of 0.18/0.07/0.47.  The axis was 64  degrees and the electrocardiogram was otherwise normal.   IMPRESSION:  1. Bradycardia - sinus - partial in part iatrogenic associated with      chronotropic incompetence.  2. Atrial fibrillation with a rapid ventricular response associated      with bradycardia.  3. History of syncope associated with atrial fibrillation.  4. Thromboembolic risk factors notable for:      a.     Age.      b.     Gender.  c.     Hypertension.  5. Normal left ventricular function.  6. Chronic dyspnea, potentially related also to musculoskeletal      abnormalities of her thorax.   DISCUSSION:  [name], [name] has chronotropic incompetence, aggravated  by her bisoprolol, which she takes for atrial fibrillation and rapid  rates.  I think, at this point, pursuing pacemaker implantation may well  help with her symptoms of exercise intolerance.  This is particularly  encouraging given the variability that she has during the day.  If it  were all limited to her skeletal abnormalities, I would not think she  would have this much variation.   She has some things  going on with her husband's healthcare in the next  couple of weeks, and so she would like [place] to defer this to the middle of  June.  That is not a problem.  I have advised her to contact us if she  has any intercurrent problems.  We have discussed the potential benefits  as well as the potential risks including, but not limited to death,  perforation, and infection.  She understand these risks and would like  to proceed.    Sincerely,      Duke Salvia, MD, [facility]  Electronically Signed    SCK/MedQ  DD: 06/23/2008  DT: 06/24/2008  Job #: [id number]

## 2010-06-18 NOTE — H&P (Signed)
. [facility]  Patient:    [name], [name] Visit Number: [id number] MRN: [id number]          Service Type: DSU Location: [facility] 2550 03 Attending Physician:  Ivor Messier Dictated by:   Guadelupe Sabin, M.D. Admit Date:  03/13/2001                           History and Physical  HISTORY OF PRESENT ILLNESS:  This was a planned outpatient surgical admission of this 77 year old white female, admitted for macular retinal hole surgery of the left eye.  This patient was in good visual health until November of 2002 when she began to note the sudden onset of blurred vision in her left eye with a two-week duration.  The patient was seen initially by Sherilyn Cooter, M.D., optometrist, in [place], [place], and found to have a macular problem.  The patient was referred to my office where a diagnosis of a retinal macular hole was confirmed.  The patient was given oral discussion and printed information concerning the operative procedure and postoperative face-down positioning.  The patient elected to wait until after the holidays to proceed with this surgery at this time.  The patient signed an informed consent and arrangements were made for her outpatient admission at this time.  PAST MEDICAL HISTORY:  The patient is under the care of Jaclyn Prime. Lucas Mallow, M.D., taking nadolol, Prevacid, Lipitor, Accupril, aspirin, Caltrate, and vitamins.  REVIEW OF SYSTEMS:  No current cardiorespiratory complaints.  PHYSICAL EXAMINATION:  Vital signs as recorded on admission were blood pressure 139/70, temperature 97.2 degrees, heart rate 44, and respirations 16.  GENERAL APPEARANCE:  The patient is a pleasant, well-nourished, well-developed, 77 year old, white female in no acute distress.  HEENT:  Eyes:  Visual acuity 20/50+ in right eye and less than 20/400 in left eye.  Applanation tonometry normal at 13 mm in right eye and 15 mm in left eye. Slit lamp  examination with early lens nuclear sclerosis.  A detailed fundus examination showed cataract haze and retina attached with macular retinal hole, grade 4, left eye.  CHEST:  Lungs clear to auscultation and percussion.  HEART:  Normal sinus rhythm.  No cardiomegaly.  No murmurs.  ABDOMEN:  Negative.  EXTREMITIES:  Negative.  ADMISSION DIAGNOSIS:  Retinal macular hole, left eye.  SURGICAL PLAN:  Pars plana vitrectomy with membrane peeling. Dictated by:   Guadelupe Sabin, M.D. Attending Physician:  Ivor Messier DD:  03/13/01 TD:  03/13/01 Job: [id number]

## 2010-06-18 NOTE — Procedures (Signed)
[facility]  Patient:    [name], [name] Visit Number: [id number] MRN: [id number]          Service Type: END Location: ENDO Attending Physician:  Sabino Gasser Dictated by:   Sabino Gasser, M.D. Admit Date:  02/28/2001                             Procedure Report  INDICATIONS:  Polyp.  ANESTHESIA:  Demerol 60 and Versed 4 mg.  PROCEDURE:  With the patient mildly sedated in the left lateral decubitus position, the Olympus videoscopic pediatric colonoscope was inserted in the rectum and passed under direct vision to the cecum, identified by the ileocecal valve and appendiceal orifice.  In the cecum was approximately a 1 cm size polyp.  It was longitudinal, and it was photographed and removed, first using hot biopsy forceps technique to remove tissue and then with hot biopsy forceps to eradicate the remainder.  From this point, the colonoscope was then slowly withdrawn, taking circumferential views in the remaining colonic mucosa, stopping only then in the rectum, which appeared normal and showed hemorrhoids on retroflexed view.  The endoscope was straightened and withdrawn.  The patients vital signs and pulse oximeter remained stable.  The patient tolerated the procedure well without apparent complications.  FINDINGS:  Polyp of cecum, eradicated.  PLAN:  Await biopsy report.  The patient will call me for results and follow up with me as an outpatient. Dictated by:   Sabino Gasser, M.D. Attending Physician:  Sabino Gasser DD:  02/28/01 TD:  02/28/01 Job: [id number]

## 2010-06-18 NOTE — H&P (Signed)
NAME:  [name], [name]                          ACCOUNT NO.:  [id number]   MEDICAL RECORD NO.:  [id number]                   PATIENT TYPE:  INP   LOCATION:  4707                                 FACILITY:  [facility]   PHYSICIAN:  Jaclyn Prime. Lucas Mallow, M.D.                DATE OF BIRTH:  Feb 04, 1923   DATE OF ADMISSION:  04/22/2002  DATE OF DISCHARGE:                                HISTORY & PHYSICAL   CHIEF COMPLAINT:  Chest pain.   HISTORY OF PRESENT ILLNESS:  This 77 year old woman developed severe  dizziness followed by nausea and chest pain this morning.  When evaluated by  the EMS, she was found to have severe postural hypotension and was unable to  stand.  All of the above was associated with dyspnea, as well.  She was  brought to the emergency room, and the emergency room physician evaluated  her, and informed me that the patient had acute coronary syndrome and needed  prompt cardiac evaluation, and the was arranged.   PAST MEDICAL HISTORY:  Osteoporosis, pseudomembranous enterocolitis, right  breast lumpectomy for disease which turned out to be benign.  Left knee  surgery.  Appendectomy in 1952.  She had a TAH and BSO in 1974.  In 1991 she  had a sigmoid colectomy for chronic diverticulitis.  She had a cardiac  catheterization in 1987, and at that time it was felt to be normal.  She has  been treated for hypercholesterolemia, mild hypertension, and hiatal hernia.  In 4/02, she had chest pain, sweating, dyspnea, and nausea, and had cardiac  catheterization which demonstrated mitral valve prolapse, mild diffuse  coronary artery irregularities, and markedly elevated left ventricular  ejection fraction.   FAMILY HISTORY:  Her father died young.  It was some sort of hemorrhage.  Her mother died at 74 with angina, arrhythmia, and hypertension.  A sister  had hypertension, and a sister had breast cancer.  Three other sisters are  living.   SOCIAL HISTORY:  She is married and retired.  She  uses no alcohol or  tobacco.  She does not abuse drugs.  She is right-handed.  There is a  significant question about compliance with her medications.   REVIEW OF SYSTEMS:  CONSTITUTIONAL:  She denies fevers.  She had sweats  today with the dizziness and the nausea.  EYES:  She has impaired vision and  wears corrective lenses.  ENT:  She has a history of dizziness  intermittently, and some tinnitus.  She also has occasional rhinorrhea.  She  has dentures in both jaws.  She denies dysgeusia.  CARDIOVASCULAR:  She the  history of the present illness.  PULMONARY:  She has occasional cough.  She  also feels that she has some wheezing from time to time.  GI:  She has an  enormous hiatal hernia, which she has had for many years.  She ordinarily  takes Nexium 40 mg daily at home.  GU:  No dysuria or pyuria.  She does have  nocturia several times.  MUSCULOSKELETAL:  She has painful joints in her  legs and muscle pain in her legs.  She has osteoarthritis.  SKIN:  She has  seborrheic dermatitis on her forehead.  Otherwise there are no skin  problems.  BREASTS:  She has no current awareness of any lumps or masses.  NEUROLOGIC:  See the history of present illness.  There is no history of  seizures or fall.  PSYCHIATRIC:  No overt depression or hallucinations.  She  does have some anxiety.  ENDOCRINE:  No known diabetes or thyroid disease.  ALLERGIC/LYMPHATIC:  No drug allergy.  HEMATOLOGIC:  No easy bruising or  unexplained lump.  All the remaining systems in the comprehensive 14-system  review are negative.   PHYSICAL EXAMINATION:  VITAL SIGNS:  Temperature 98.2, pulse 53,  respirations 20, blood pressure 126/69 sitting.  There is a substantial  postural drop with associated weakness and wooziness.  GENERAL:  She is awake and alert.  A moderately obese elderly woman who  looks fully her stated age of 62.  She is oriented to person and place.  Her  mood and affect are pleasant.  Mouth reveals full  dentures.  The oral mucosa  is without pallor or cyanosis.  NECK:  Supple and symmetrical.  The trachea is midline and mobile.  There is  no palpable thyromegaly or cervical node.  No carotid bruit or JVD.  BACK:  Moderate to marked kyphoscoliosis.  There is no punch tenderness over  the spine.  LUNGS:  Scattered crackles in the bases.  She has no marked dullness in the  bases.  CARDIAC:  Apical pulse is cryptic.  The left border of cardiac dullness is  obscure.  The heart sounds are distant.  The rhythm and rate are normal.  There was no gallop, click, or murmur.  Her gait is not tested.  She did not  undergo much of a stress test.  SKIN:  No stasis dermatitis or ulcer.  She has no significant  lymphadenopathy.  The digits and nails reveal no clubbing or cyanosis.  Her  abdomen is mildly to moderately obese and nontender.  There is no palpable  enlargement of the liver or spleen.  The abdominal aorta is not palpable,  and there is no bruit.  The femoral arteries are not palpable, and there is  no bruit.  The pedal pulses are decreased, with only the dorsalis pedis  pulses bilaterally at 1 to 2+.  NEUROLOGIC:  She has a fine intention tremor bilaterally.  EXTREMITIES:  No edema.   EKG is unremarkable.   The patient presents with prolonged substernal chest tightness, nausea, and  vomiting, as well as postural hypotension and dyspnea.  She has a history of  known minor coronary irregularities.  Cardiac catheterization is clearly  the most effective test to evaluate for unstable angina syndrome and/or  acute coronary thrombi.  This will be carried out, and further intervention  provided as necessary.                                               Jaclyn Prime. Lucas Mallow, M.D.    DDG/MEDQ  D:  04/22/2002  T:  04/23/2002  Job:  [id number]   cc:  John R. Aleen Campi, M.D.  [address]  [place]  [place] [address]  Fax: [phone]

## 2010-06-18 NOTE — Procedures (Signed)
[facility]  Patient:    [name], [name]                       MRN: [id number] Proc. Date: 08/17/00 Adm. Date:  [id number] Attending:  Sabino Gasser                           Procedure Report  PROCEDURE:  Upper endoscopy with Savary dilation.  INDICATION FOR PROCEDURE:  Dysphagia.  ANESTHESIA:  Demerol 50, Versed 2.5 mg was preceded with ampicillin and gentamicin.  DESCRIPTION OF PROCEDURE:  With the patient mildly sedated in room seven of radiology, the Olympus videoscopic endoscope was inserted in the mouth and passed under direct vision through the esophagus. The distal esophagus was approached and showed a stricture which was photographed. We entered into this into a large hiatal hernia sac. The stomach was visualized, fundus, body, and antrum. We advanced to the duodenal bulb and second portion of duodenum all of which appeared normal. From this point,  the endoscope was slowly withdrawn taking circumferential views of the entire duodenal mucosa until the endoscope was then pulled back into the stomach, placed in retroflexion to view the stomach from below. The endoscope was then straightened and a guidewire was passed. The endoscope was withdrawn taking circumferential views of the remaining gastric and esophageal mucosa which otherwise appeared normal. Subsequently over the guidewire, a Savary dilator of 14 and 17 were passed easily. With the latter, the guidewire was removed. The endoscope was reinserted. Minimal bleeding was seen in the area of the stricture. The endoscope was withdrawn.  The patients vital signs and pulse oximeter remained stable. The patient tolerated the procedure well without apparent complications.  FINDINGS:  Stricture of distal esophagus dilated, a large hiatal hernia.  PLAN:  Await clinical response. DD:  08/17/00 TD:  08/17/00 Job: [id number]
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
## 2010-06-18 NOTE — Cardiovascular Report (Signed)
NAME:  [name], [name]                          ACCOUNT NO.:  [id number]   MEDICAL RECORD NO.:  [id number]                   PATIENT TYPE:  INP   LOCATION:  4735                                 FACILITY:  [facility]   PHYSICIAN:  Aram Candela. Tysinger, M.D.              DATE OF BIRTH:  04-30-1923   DATE OF PROCEDURE:  DATE OF DISCHARGE:  04/23/2002                              CARDIAC CATHETERIZATION   PROCEDURES:  1. Left heart catheterization.  2. Coronary cineangiography.  3. Left ventricular cineangiography.  4. Abdominal aortogram.  5. Perclose of the right femoral artery.   INDICATION FOR PROCEDURE:  This 77 year old female has a history of  hypertrophic cardiomyopathy and mild coronary artery disease with mild  coronary plaque and mild ectasia.  She has been on a beta blocker for  control of her hypertrophic cardiomyopathy; however, she takes her medicines  very irregularly.  Recently she has had an increase in weak spells with  marked nausea and today had a prolonged episode of marked weakness and  nausea and pressure in her chest.  She was seen in the emergency room, where  the emergency room physician felt that she was unstable and made a diagnosis  of unstable angina.  After he talked to Dr. Lucas Mallow, the decision was made to  be re-catheterized today.   DESCRIPTION OF PROCEDURE:  After signing an informed consent, the patient  was premedicated with 5 mg of Valium by mouth and brought to the cardiac  catheterization lab from the emergency room at [facility].  Her  right groin was prepped and draped in the usual sterile fashion and  anesthetized locally with 1% lidocaine.  A 6 French introducer sheath was  inserted percutaneously into the right femoral artery.  Six [place] #4  Judkins coronary catheters were used to make injections into the native  coronary arteries.  A 6 French pigtail catheter was used to measure  pressures in the left ventricle and aorta and to make a  midstream injection  into the left ventricle and abdominal aorta.  The patient tolerated the  procedure well, and no complications were noted.  At the end of the  procedure, the catheter and sheath were removed from the right femoral  artery and hemostasis was easily obtained with the Perclose closure system.   MEDICATIONS GIVEN:  None.   HEMODYNAMIC DATA:  Left ventricular pressure 149/14-23, aortic pressure  136/56 with a mean of 91.  Left ventricular ejection fraction between 80 and  90.   CINEANGIOGRAPHY FINDINGS:  CORONARY CINEANGIOGRAPHY:  1. Left coronary artery:  The ostium and left main appear normal.  2. Left anterior descending:  There is a mild plaque in the middle segment     causing a 20-30% stenosis.  The first anterolateral branch has a mild     stenosis of approximately 20-30% at its takeoff.  3. Circumflex coronary artery:  The  circumflex has a mild plaque in its     proximal segment causing a 20-30% stenosis.  The middle segment has mild     ectasia.  4. Right coronary artery:  The ostium appears normal.  There is a mild     plaque in the proximal segment prior to the right ventricular branch.     This causes a 20-30% stenosis, and there is mild ectasia distal to the     right ventricular branch.  The remainder of the right coronary artery     appears normal.   LEFT VENTRICULAR CINEANGIOGRAM:  The left ventricular chamber size appears  normal.  The left ventricular wall thickness is increased in a concentric  pattern.  The left ventricular contractility is hyperdynamic with a  concentric hyperdynamic contraction causing an ejection fraction of between  80 and 90%.  This has an appearance of an empty ventricle during diastole.  There is moderate mitral insufficiency caused by PVCs during the injection.  The mitral and aortic valve appear to be fairly normal without  calcification.   ABDOMINAL AORTOGRAM:  A midstream injection into the abdominal aorta shows  mild  plaque with mild calcification.  The suprarenal abdominal aorta is  mildly dilated with normal-appearing diameter of the infrarenal abdominal  aorta.  The right renal artery has mild distal dilation but with normal  flow.  The left renal artery has mild nodularity in the middle segment  without significant stenosis.   FINAL DIAGNOSES:  1. Coronary artery disease with mild 20-30% stenosis in the proximal     circumflex, mid-left anterior descending artery, and the right coronary     artery.  Also mild ectasia in the circumflex and right coronary arteries.  2. Hypertrophic cardiomyopathy, severe, with empty ventricle syndrome and     marked concentric left ventricular hypertrophy, ejection fraction 80-90%.  3. Mild suprarenal abdominal aortic aneurysm.  4. Mild ectasia, right renal artery.  5. Successful Perclose of the right femoral artery.   DISPOSITION:  The patient is being admitted by Dr. Lucas Mallow for further  evaluation and treatment.  She may need a marked increase in her beta  blocker, which would affect her already-slow sinus rate even further and may  necessitate placement of a permanent transvenous pacemaker.                                                John R. Tysinger, M.D.    JRT/MEDQ  D:  04/22/2002  T:  04/23/2002  Job:  [id number]   cc:   Jaclyn Prime. Lucas Mallow, M.D.  [address]  [place]  [place] [address]  Fax: [phone]   [facility]

## 2010-06-18 NOTE — Op Note (Signed)
[place]  Patient:    [name], [name] Visit Number: [id number] MRN: [id number]          Service Type: DSU Location: [facility]M 2550 03 Attending Physician:  Ivor Messier Dictated by:   Guadelupe Sabin, M.D. Proc. Date: 03/13/01 Admit Date:  03/13/2001                             Operative Report  PREOPERATIVE DIAGNOSIS:  Retinal macular hole, left eye.  POSTOPERATIVE DIAGNOSIS:  Retinal macular hole, left eye.  OPERATIONS:  Pars plana vitrectomy, left eye, with membrane peeling, vitreous air exchange, and air/C3F8 10% exchange.  SURGEON:  Guadelupe Sabin, M.D.  ASSISTANT:  Nurse.  ANESTHESIA:  General.  OPHTHALMOSCOPY:  As previously described.  DESCRIPTION OF PROCEDURE:  After the patient was prepped and draped, a lid speculum was inserted in the left operated eye.  A peritomy was extended from the 8 to 4 oclock position superiorly.  Three sclerotomy sites were prepared at the 4, 2, and 10 oclock positions 3.5 mm from the limbus using the MVR blade.  The 4 mm vitreous infusion terminal was secured in place with a preplaced mattress suture of 5-0 white Dacron.  The fiberoptic light pipe was inserted at the 2 oclock position and the handpiece of the vitreous infusion suction cutter at the 10 oclock position.  Slow vitreous infusion suction cutting were begun from a posterior direction toward the retina.  As the retina was approached, the macular retinal hole could be well visualized.  The grizzard soft-tip silicone cannula was then introduced with aspiration.  Over the optic nerve there was no definite membrane detected.  Temporal to the macula, however, the membrane was found and elevated from the retinal surface as a thickened sheet.  This was peeled nasally and was adherent to the retinal edges of the macular hole, particularly at the 11 oclock position.  The Barth Kirks pick was then used to gently tease the membrane from the  edge of the macular hole and continue peeling over the posterior retina.  The membrane then contracted forward and was elevated into the mid vitreous.  The vitreous infusion suction cutter handpiece was then used to aspirate and remove the relatively thin membrane, which was then easily visualized.  There was slight edema from around the macular hole, but the cuff of subretinal fluid appeared to be less as the membrane peeled.  It was then elected to close.  The vitreous was exchanged for air, flattening the macular hole.  Indirect ophthalmoscopy with scleral depression was then carried out.  There were no peripheral retinal holes or detachment areas noted.  The air was then exchanged for a 10% C3F8 mixture.  The sclerotomy sites were closed with 7-0 Vicryl sutures.  The conjunctiva was pulled forward and closed with a running 7-0 Vicryl suture.  Depo-Garamycin and dexamethasone were injected in the sub-tenons space inferiorly and Maxitrol and atropine ointment instilled in the conjunctivae cul-de-sac.  The patient was to be placed postoperatively on her right side until the patient was alert and could assume the face-down positioning necessary for the success of this operation.  The duration of the procedure and anesthesia administration was one hour.  The patient tolerated the procedure well in general and left the operating room for the recovery room in good condition. Dictated by:   Guadelupe Sabin, M.D. Attending Physician:  Ivor Messier DD:  03/13/01 TD:  03/13/01 Job: [id number]
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
## 2010-06-18 NOTE — H&P (Signed)
[place]  Patient:    [name], [name]                      MRN: [id number] Adm. Date:  05/01/00 Attending:  Aram Candela. Aleen Campi, M.D. Dictator:   Moshe Salisbury, N.P. CC:         Jaclyn Prime. Lucas Mallow, M.D.   History and Physical  DATE OF BIRTH: 27-May-1923.  CHIEF COMPLAINT: Chest pain.  HISTORY OF PRESENT ILLNESS: This 77 year old white female, now in no acute distress, has been having chest pain for about six months which starts in her left hand and then moves up her arm and radiates to her left chest.  It is a mild to moderate heaviness causing sweating, dyspnea, nausea without vomiting, and weakness.  It normally lasts two hours and the patient is fatigued afterward.  Today it started about 10 a.m. and was moderate.  It has been better or worse off and on all day long, but has stayed with her most of the day.  She had sweating and nausea which occurred twice, once in the morning and once in the evening about 2 p.m., and lasted for about 30 minutes. She does not use nitroglycerin.  Nothing seems to cause this pain and nothing seems to make it better or worse.  It does not awaken the patient at night but she does have some mild episodes where she has nausea and sweating just after she goes to bed before she goes to sleep.  Her husband says that while it may not happen every day when she does does have a chest pain she has it two or three times a day.  She took aspirin this morning but nothing else for pain. After arriving at the hospital and having nitroglycerin started in a drip she says that her pain did subside.  PAST MEDICAL HISTORY:  1. Mitral valve prolapse.  2. Hypertension.  3. Questionable coronary artery disease.  4. Diverticulosis.  5. Intestinal adhesions.  6. GERD.  7. Hiatal hernia.  8. History of inner ear disturbance.  9. Palpitations. 10. Chest pain. 11. History of phlebitis in her left leg. 12. Peptic ulcer  disease. 13. Fibromyalgia. 14. Osteoarthritis. 15. Osteoporosis. 16. History of pseudomembranous colitis.  PAST SURGICAL HISTORY:  1. Right breast lumpectomy, which was benign.  2. Surgery on left knee, removing cartilage.  3. Hysterectomy, complicated by peritonitis and led to resection of a     portion of her intestines, appropriately her sigmoid colon.  ALLERGIES: No known drug allergies.  CURRENT MEDICATIONS:  1. Prilosec 20 mg one b.i.d.  2. Enteric-coated aspirin 325 mg one q.d.  3. Corgard 40 mg one q.d.  4. Accupril 40 mg one q.d.  5. Dyazide p.r.n.  6. Recently has taken Lasix p.r.n.  7. Lipitor 10 mg one q.d.  8. She has taken meclizine in the past for vertigo.  She is apparently not compliant with her medications, taking them only when she feels bad, and she is not 100% certain about her dosages and did not bring her bottles with her or any documentation of her medications.  Her granddaughter, who is a Designer, jewellery here at [employer]. [facility] in the catheterization laboratory, will go home and obtain the patients medication dosages and call them back to the hospital.  FAMILY HISTORY: The patients father died young of some kind of a hemorrhage. Her mother died just last year at the age  of 91.  She had angina and an arrhythmia with hypertension.  She has one sister who had hypertension and one sister who had breast cancer.  She has three other sisters.  SOCIAL HISTORY: The patient is married.  She is retired.  She does not use tobacco or alcohol.  She is stopping on her caffeine, though she occasionally does drink some coffee.  She does not abuse drugs.  She is right-handed.  REVIEW OF SYSTEMS: CONSTITUTIONAL: The patient denies any fevers but she did have a chill yesterday.  She has had sweats today.  Her weight is down some, almost ten pounds.  She professes to have edema in her ankles in the evenings. She does not sleep well.  EYES: The patient  denies diplopia or blurring, though she does have a cataract in her right eye which she says is beginning to cause her some problem.  She uses reading glasses but she does not wear contacts, and she does not have glaucoma.  EARS, NOSE, MOUTH, THROAT: The patient denies deafness, sneezing, or sores in her mouth.  She has ringing in her ears at times.  Her nose runs from time to time.  Food tastes good to her. She does have a complete set of dentures but she only wears the upper plate. CARDIOVASCULAR: Chest pain as noted.  She does have some palpitations.  She has dyspnea with her chest pain, and she has orthopnea.  That is, she cannot breathe well when she lies down.  She states her legs do hurt worse when she walks but sometimes they also hurt when she is at rest.  RESPIRATORY: The patient has some cough which she says is productive of sputum but she swallows it.  She does have some wheezing from time to time.  She never smoked. GASTROINTESTINAL: The patient has difficulty swallowing and she has nausea whenever she has chest pain.  She has frequent indigestion due to a large hiatal hernia.  The patient has alternating diarrhea and constipation, though irritable bowel syndrome has not been diagnosed.  GENITOURINARY: The patient denies dysuria, pyuria, hematuria, anuria, hesitation, and frequency.  She has nocturia from 0-1 time at night.  She is status post hysterectomy. MUSCULOSKELETAL: The patient has painful joints in her legs and also muscle aches in her legs.  She has frequent fatigue and her gait is unsteady.  She does have some tremors.  SKIN: The patient denies rashes but she does seem to have some seborrheic dermatitis on her forehead.  BREAST: The patients breast are without masses, lumps, tenderness, or discharge.  NEUROLOGIC: The patient denies faintness or syncope, though she does have dizziness.  She states that her hands and feet are numb from time to time.  She denies any  seizures or symptoms of a stroke.  PSYCHIATRIC: The patient denies depression, anorexia,  or hallucinations, but she does have anxiety frequently.  ENDOCRINE: The patient denies thyroid disease or diabetes mellitus.  She states she has been excessively thirsty in the past but is not any longer.  She denies excessive hunger or excessive urine volume output.  HEMATOLOGIC: The patient states she both bruises and bleeds easily.  LYMPHATICS: The patient denies adenopathy of the neck, axilla, and groin.  ALLERGIC: The patient has no known drug allergies and she denies any nondrug allergies.  PHYSICAL EXAMINATION:  VITAL SIGNS: Temperature 98.2 degrees Fahrenheit, pulse 54, respirations 18, blood pressure 153/74.  Weight 191 pounds.  Oxygen saturation 97% on room air.  GENERAL: The  patient is a well-developed, well-nourished white female in no acute distress.  She is obese and 77 years of age.  HEENT: PERRL, 2 mm in diameter.  Mouth moist.  Oropharynx benign.  She has a set of upper dentures in her mouth but no lower dentures.  Head normocephalic, atraumatic.  NECK: Supple, with midline trachea, without jugular venous distention, bruits, thyromegaly, or hepatojugular reflux.  CHEST: The patient has both scoliosis and kyphosis, which is moderate.  Her chest is clear to percussion.  She has scattered crackles throughout her lung fields.  She is eupneic and is on one liter of oxygen via nasal cannula.  BREAST: Pendulous, without discharge and without tenderness.  ADENOPATHY: The patient is without cervical adenopathy.  CARDIAC: The patient has a regular rate and rhythm and is in sinus bradycardia.  S1 and S2 heard.  She is without murmurs, gallops, rubs, or clicks.  ABDOMEN: The patient has positive bowel sounds throughout her abdomen.  There is increased percussion in every field.  She is obese.  She is nontender and she is without distention.  GENITOURINARY: The patient is nontender  over her bladder.  She is status post hysterectomy.  EXTREMITIES: The patient moves all extremities x 4.  Strength is 4/3 in her upper extremities and 5/5 in her lower extremities.  She is without ankle edema.  SKIN: The patients skin is warm and dry without jaundice, cyanosis, pallor, or rash.  Capillary refill is brisk.  NEUROLOGIC: The patients left arm and hand show intention tremor.  Both hands show fine tremor.  Cranial nerves 2-12 are grossly intact.  She is conscious, alert, oriented to person, place, time, and situation.  PSYCHIATRIC: The patient is pleasant, cooperative, and responds appropriately.  PSYCHOSOCIAL: The patient is accompanied to the emergency room by her husband and was joined at the emergency room by her daughter, who works in the catheterization laboratory as a Designer, jewellery.  LABORATORY DATA/X-RAYS: The patients chest x-ray shows a hiatal hernia according to the radiologist.  A recent CT done on April 11, 2000 showed a large hiatal hernia with near complete intrathoracic position of the stomach, a previous hysterectomy, and no other abnormalities of the pelvis or abdomen.  Complete metabolic panel shows a sodium of 141, potassium 4.9, chloride 111, bicarbonate 25, BUN 13, creatinine 0.8, and glucose 96.  WBC was 5.9, hemoglobin 13.4, hematocrit 38.6, platelets 196,000; all other values in her CBC were within normal limits.  The patients cardiac enzymes showed a CK of 147, MB 3.6, index 2.4, and troponin I less was less than 0.01.  IMPRESSION:  1. Chest pain with angina-like symptoms.  2. Hypertension.  3. Palpitations.  4. Mitral valve prolapse.  5. History of phlebitis in her legs.  PLAN:  1. Admit to telemetry.  2. Serial enzymes and EKGs.  3. Admission laboratories and chest x-ray.  4. Home medications.  5. Oxygen at one liter by nasal cannula.  6. The patient is to have cardiac catheterization tomorrow by Dr. Charolette Child. DD:   05/01/00 TD:  05/02/00 Job: [id number]
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
## 2010-06-18 NOTE — Cardiovascular Report (Signed)
[place]. [facility]  Patient:    [name], [name]                       MRN: [id number] Proc. Date: 05/02/00 Adm. Date:  [id number] Attending:  Silvestre Mesi CC:         Jaclyn Prime. Lucas Mallow, M.D.             Cardiac Catheterization Lab                        Cardiac Catheterization  PROCEDURES: 1. Left heart catheterization. 2. Coronary cineangiography. 3. Left ventricular cineangiography. 4. Abdominal aortogram. 5. Perclose of the right femoral artery.  INDICATION FOR PROCEDURES:  This 77 year old female has a history of bradycardia, hypertension, and anterior chest pain with recent acceleration and symptoms with chest pain and perfuse sweating and weakness.  She was admitted last evening to [facility] where she presented to the emergency room after a prolonged episode of chest pain and sweating.  She has a history of mitral valve disease with mitral insufficiency.  PROCEDURE IN DETAIL:  After signing an informed consent, the patient was premedicated with 50 mg of Benadryl intravenously and brought to the cardiac catheterization lab.  Her right groin was prepped and draped in a sterile fashion and anesthetized locally with 1% lidocaine.  A 6-French introducer sheath was inserted percutaneously into the right femoral artery, and 6-French, #4 Judkins coronary cathethers were used to make injections into the coronary arteries.  A 6-French pigtail catheter was used to measure pressures in the left ventricle and aorta and to make midstream injections into the left ventricle and abdominal aorta.  The patient tolerated the procedure well, and no complications were noted.   At the end of the procedure, the catheter and sheath were removed from the right femoral artery, and hemostasis was easily obtained with a Perclose closure system.  MEDICATIONS GIVEN:  None.  HEMODYNAMIC DATA:  Left ventricular pressure 151/0 to 15, aortic pressure 149/63 with a  mean of 91.  Left ventricular ejection fraction 74%.  CINE FINDINGS:  CORONARY CINEANGIOGRAPHY:  Left coronary artery: The ostium and left main appear normal.  Left anterior descending artery appears normal.  Circumflex coronary artery:  There is very mild ectasia of the proximal segment but with normal flow and no evidence for decreased antegrade flow. There was no evidence for an atherosclerotic plaque.  Right coronary artery: The ostium appears normal.  There is a very minor plaque in the proximal segment which does not cause a significant stenosis, and there is normal antegrade flow.  The middle and distal segments appear normal.  LEFT VENTRICULAR CINEANGIOGRAM:  Left ventricular chamber size, contractility, and wall thickness appear normal.  There is a mild to moderate napkin ring appearance in the basilar area with hyperdynamic contractility suggestive of asymmetric septal hypertrophy.  The overall left ventricular ejection fraction is normal, measured at 74%, and there are no hypokinetic segments.  The aortic valve appears to be a normal three-cusp valve.  The mitral valve has no prolapse or calcification.  There is mild to moderate mitral insufficiency.  ABDOMINAL AORTOGRAM:  The suprarenal abdominal aorta appears normal.  The infrarenal abdominal aorta has mild to moderate calcification but without stenosis or aneurysm.  The right renal artery appears normal.  The left renal artery has mild to moderate fibromuscular dysplasia in the middle segment. There is normal antegrade flow.  FINAL  DIAGNOSES: 1. Essentially normal coronary arteries with very minor plaque in the mid    right coronary artery and very minor ectasia of her proximal circumflex,    both with normal flow. 2. Evidence of asymmetric septal hypertrophy with hyperdynamic contraction    of the basilar area and with normal left ventricular ejection fraction. 3. Mild to moderate mitral insufficiency. 4. Mild  fibromuscular dysplasia of the left renal artery. 5. Mild calcification of the distal abdominal aorta. 6. Successful Perclose of the right femoral artery.  DISPOSITION:   With the chronic slow sinus rhythm in the 40s and with low rate in the lower 40s at rest, she will need Holter monitoring to try to correlate her symptomatic periods with a more severe bradycardic arrhythmia.  This coupled with her possible left ventricular outflow obstruction may necessitate the need for permanent pacing in order to increase her beta blockers. Followup will be by Dr. Aggie Cosier. DD:  05/02/00 TD:  05/02/00 Job: [id number]
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
## 2010-06-24 ENCOUNTER — Encounter: Payer: Medicare Other | Admitting: *Deleted

## 2010-06-25 ENCOUNTER — Encounter: Payer: Self-pay | Admitting: *Deleted

## 2010-07-01 ENCOUNTER — Ambulatory Visit (INDEPENDENT_AMBULATORY_CARE_PROVIDER_SITE_OTHER): Payer: Medicare Other | Admitting: Internal Medicine

## 2010-07-01 ENCOUNTER — Encounter: Payer: Self-pay | Admitting: Internal Medicine

## 2010-07-01 ENCOUNTER — Ambulatory Visit (INDEPENDENT_AMBULATORY_CARE_PROVIDER_SITE_OTHER): Payer: Medicare Other | Admitting: *Deleted

## 2010-07-01 VITALS — BP 124/88 | HR 101 | Resp 20 | Ht 60.0 in | Wt 187.0 lb

## 2010-07-01 DIAGNOSIS — R072 Precordial pain: Secondary | ICD-10-CM

## 2010-07-01 DIAGNOSIS — I495 Sick sinus syndrome: Secondary | ICD-10-CM

## 2010-07-01 DIAGNOSIS — I10 Essential (primary) hypertension: Secondary | ICD-10-CM

## 2010-07-01 DIAGNOSIS — I4891 Unspecified atrial fibrillation: Secondary | ICD-10-CM

## 2010-07-01 MED ORDER — BISOPROLOL FUMARATE 10 MG PO TABS: 10.0000 mg | ORAL_TABLET | Freq: Every day | ORAL | Status: DC

## 2010-07-01 NOTE — Assessment & Plan Note (Signed)
Blood pressure well controlled. Continue current regimen.  

## 2010-07-01 NOTE — Patient Instructions (Addendum)
Increase Bisoprolol to 10 mg daily  Your physician wants you to follow-up in: 9 months.  You will receive a reminder letter in the mail two months in advance. If you don't receive a letter, please call our office to schedule the follow-up appointment.

## 2010-07-01 NOTE — Progress Notes (Signed)
HPI:  [name] is a delightful 77 year old woman with a history of syncope in the setting of atrial fibrillation c/b tachybrady syndrome s/p pacemaker placement in 2010, chronic dyspnea as well as hypertension and gastroesophageal reflux disease.She returns for f/u.  Last year we decreased her bisprolol due to resting breadycardia and ordered a Myoview to further evaluate her chronic dyspnea. Myoview low risk. Had event monitor last year with PACs but no significant arrhythmias or pauses.   Says she is doing pretty well. Does have chronic dyspnea which is unchanged. Able to do all ADLs. Mild edema. Says when she walks any distance her heart starts racing. No orthopnea or PND. Taking coumadin without bleeding. INRs have been "perfect". Rare episode of CP, non-exertional. No change.    ROS: All systems negative except as listed in HPI, PMH and Problem List.  Past Medical History  Diagnosis Date  . Paroxysmal atrial fibrillation     tachy-brady syndrome  . Hypertension   . Hyperlipidemia   . GERD (gastroesophageal reflux disease)   . Chest pain     with cath 2002 showing mild non-obstructive CAD  . Osteoporosis   . Dyspnea     chronic; myoview 11/9. EF 81% probable soft-tissue attenuation in anterior and lat walls, can't exclude ischemia  . Obesity     Current Outpatient Prescriptions  Medication Sig Dispense Refill  . aspirin 81 MG tablet Take 81 mg by mouth daily.        . bisoprolol (ZEBETA) 5 MG tablet Take 5 mg by mouth daily.        . fish oil-omega-3 fatty acids 1000 MG capsule Take 2 g by mouth daily.        [name] lisinopril-hydrochlorothiazide (PRINZIDE,ZESTORETIC) 10-12.5 MG per tablet Take 1 tablet by mouth daily.        [name] omeprazole (PRILOSEC) 20 MG capsule Take 20 mg by mouth daily.        . potassium chloride (KLOR-CON) 20 MEQ packet Take 20 mEq by mouth daily as needed.        . simvastatin (ZOCOR) 40 MG tablet Take 40 mg by mouth at bedtime.        [name] warfarin (COUMADIN)  4 MG tablet Take 4 mg by mouth. Use as directed by Anticoagulation Clinic          PHYSICAL EXAM: Filed Vitals:   07/01/10 1214  BP: 124/88  Pulse: 101  Resp: 20   General:  Elderly. +diffuse  tremor. No acute distress. HEENT: normal except for scar from skin cancer removal ont he bridge of her nose Neck: supple. no JVD. Carotids 2+ bilat; no bruits. No lymphadenopathy or thryomegaly appreciated. Cor: PMI nondisplaced. Irregular rate & rhythm. No rubs, gallops, murmur. Back + scoliosis/kyphosis. Lungs: clear Abdomen: soft, nontender, nondistended.  Good bowel sounds. Extremities: no cyanosis, clubbing, rash, edema Neuro: alert & orientedx3, cranial nerves grossly intact. moves all 4 extremities w/o difficulty    ECG: Atrial fib 101 No ST-T wave abnormalities.       ASSESSMENT & PLAN:

## 2010-07-01 NOTE — Progress Notes (Signed)
Pacer check by industry 

## 2010-07-01 NOTE — Assessment & Plan Note (Addendum)
AF is chronic. Well tolerated. Rate a bit fast today will increase bisoprolol to 10 daily. Interrogate pacer today to see average rate. Continue coumadin.

## 2010-07-01 NOTE — Assessment & Plan Note (Signed)
Chronic. No progression. Myoview reassuring. No further testing at this time.

## 2010-07-19 ENCOUNTER — Encounter (INDEPENDENT_AMBULATORY_CARE_PROVIDER_SITE_OTHER): Payer: Medicare Other | Admitting: Internal Medicine

## 2010-07-19 ENCOUNTER — Other Ambulatory Visit: Payer: Self-pay

## 2010-07-19 ENCOUNTER — Encounter: Payer: Self-pay | Admitting: Internal Medicine

## 2010-07-19 ENCOUNTER — Other Ambulatory Visit: Payer: Self-pay | Admitting: *Deleted

## 2010-07-19 DIAGNOSIS — I5032 Chronic diastolic (congestive) heart failure: Secondary | ICD-10-CM

## 2010-07-19 DIAGNOSIS — I4891 Unspecified atrial fibrillation: Secondary | ICD-10-CM

## 2010-07-19 DIAGNOSIS — I495 Sick sinus syndrome: Secondary | ICD-10-CM

## 2010-07-19 DIAGNOSIS — Z950 Presence of cardiac pacemaker: Secondary | ICD-10-CM

## 2010-07-19 DIAGNOSIS — I498 Other specified cardiac arrhythmias: Secondary | ICD-10-CM

## 2010-07-19 MED ORDER — DILTIAZEM HCL ER COATED BEADS 120 MG PO CP24: 120.0000 mg | ORAL_CAPSULE | Freq: Every day | ORAL | Status: DC

## 2010-07-19 MED ORDER — SIMVASTATIN 20 MG PO TABS: 20.0000 mg | ORAL_TABLET | Freq: Every day | ORAL | Status: DC

## 2010-07-20 NOTE — Assessment & Plan Note (Signed)
[facility]                       [place] CARDIOLOGY OFFICE NOTE  NAME:WRIGHTRamsie, Ostrander                       MRN:          [id number] DATE:07/19/2010                            DOB:          1923/05/27   Mrs. Vadala is seen in followup for tachybrady syndrome with now permanent atrial fibrillation.  She has a history of syncope.  She has had problems with dyspnea on exertion and when we last saw her there were significant issues related to inadequate rate control.  In the last year, she also saw Dr. Gala Romney who undertook echo demonstrating persistent normal left ventricular size with only moderate left atrial enlargement.  Her Myoview scan done last year was also relatively normal.  She continues, however, to suffer with a significant dyspnea on exertion.  She has mild peripheral edema.  Current medications include bisoprolol now at 10, warfarin, simvastatin 40, potassium, lisinopril, and hydrochlorothiazide.  PHYSICAL EXAMINATION:  VITAL SIGNS:  Blood pressure was 126/76, heart rate was 105 and irregular. NECK:  Neck veins were 8. LUNGS:  Clear.  She has a large dowager's hump. HEART:  Heart sounds were irregular and rapid. ABDOMEN:  Soft. EXTREMITIES:  Trace edema.  Interrogation of her St. Jude pacemaker demonstrated atrial fibrillation amplitude of 0.3 with an R-wave of 12.  The impedance was 350 in the A, 690 in the V.  Heart rate excursion demonstrated that fully 70-80% of her beats are faster than 110.  Many of these are paced suggesting sensing issues and tracking problems.  IMPRESSION: 1. Atrial fibrillation with a rapid ventricular response with     inordinate ventricular pacing at high rates. 2. Atrial fibrillation - persistent. 3. Previously normal left ventricular function. 4. Congestive heart failure - presumably diastolic with dyspnea on     exertion.  We will plan to reprogram her device in the DDIR mode to exclude  atrial sensing.  We will also add diltiazem 120 mg to slow her heart rate down. Because of drug interactions we will decrease her simvastatin to 20 mg.  We will plan to see her again in 4 weeks' time to review adequacy of rate control.    Duke Salvia, MD, [facility]    SCK/MedQ  DD: 07/19/2010  DT: 07/20/2010  Job #: [id number]

## 2010-07-20 NOTE — Assessment & Plan Note (Deleted)
[facility]                        ELECTROPHYSIOLOGY OFFICE NOTE  NAME:WRIGHTMckell, Riecke                       MRN:          [id number] DATE:07/19/2010                            DOB:          [birth year]   [name] is seen in followup for tachybrady syndrome with now permanent atrial fibrillation.  She has a history of syncope.  She has had problems with dyspnea on exertion and when we last saw her there were significant issues related to inadequate rate control.  In the last year, she also saw Dr. Gala Romney who undertook echo demonstrating persistent normal left ventricular size with only moderate left atrial enlargement.  Her Myoview scan done last year was also relatively normal.  She continues, however, to suffer with a significant dyspnea on exertion.  She has mild peripheral edema.  Current medications include bisoprolol now at 10, warfarin, simvastatin 40, potassium, lisinopril, and hydrochlorothiazide.  PHYSICAL EXAMINATION:  VITAL SIGNS:  Blood pressure was 126/76, heart rate was 105 and irregular. NECK:  Neck veins were 8. LUNGS:  Clear.  __________ HEART:  Heart sounds were irregular and rapid. ABDOMEN:  Soft. EXTREMITIES:  Trace edema.  Interrogation of her [employer]. Jude pacemaker demonstrated atrial fibrillation amplitude of 0.3 with an R-wave of 12.  The impedance was 350 in the A, 690 in the V.  Heart rate excursion demonstrated that fully 70-80% of her beats are faster than 110.  Many of these are paced suggesting sensing issues and tracking problems.  IMPRESSION: 1. Atrial fibrillation with a rapid ventricular response with     inordinate ventricular pacing at high rates. 2. Atrial fibrillation - persistent. 3. Previously normal left ventricular function. 4. Congestive heart failure - presumably diastolic with dyspnea on     exertion.  We will plan to reprogram her device in the DDIR mode to exclude atrial sensing.  We will  also add diltiazem 120 mg to slow her heart rate down. Because of drug interactions we will decrease her simvastatin to 20 mg.  We will plan to see her again in 4 weeks' time to review adequacy of rate control.    Duke Salvia, MD, [facility]    SCK/MedQ  DD: 07/19/2010  DT: 07/20/2010  Job #: [id number]

## 2010-08-04 ENCOUNTER — Emergency Department (HOSPITAL_COMMUNITY)
Admission: EM | Admit: 2010-08-04 | Discharge: 2010-08-05 | Disposition: A | Discharge status: Home / Self Care | Payer: Medicare Other | Attending: Emergency Medicine | Admitting: Emergency Medicine

## 2010-08-04 DIAGNOSIS — Z8679 Personal history of other diseases of the circulatory system: Secondary | ICD-10-CM | POA: Insufficient documentation

## 2010-08-04 DIAGNOSIS — Z950 Presence of cardiac pacemaker: Secondary | ICD-10-CM | POA: Insufficient documentation

## 2010-08-04 DIAGNOSIS — I10 Essential (primary) hypertension: Secondary | ICD-10-CM | POA: Insufficient documentation

## 2010-08-04 DIAGNOSIS — K2970 Gastritis, unspecified, without bleeding: Secondary | ICD-10-CM | POA: Insufficient documentation

## 2010-08-04 DIAGNOSIS — E7800 Pure hypercholesterolemia, unspecified: Secondary | ICD-10-CM | POA: Insufficient documentation

## 2010-08-04 DIAGNOSIS — I4891 Unspecified atrial fibrillation: Secondary | ICD-10-CM | POA: Insufficient documentation

## 2010-08-04 DIAGNOSIS — R0789 Other chest pain: Secondary | ICD-10-CM | POA: Insufficient documentation

## 2010-08-04 DIAGNOSIS — R61 Generalized hyperhidrosis: Secondary | ICD-10-CM | POA: Insufficient documentation

## 2010-08-04 DIAGNOSIS — I2510 Atherosclerotic heart disease of native coronary artery without angina pectoris: Secondary | ICD-10-CM | POA: Insufficient documentation

## 2010-08-04 DIAGNOSIS — M546 Pain in thoracic spine: Secondary | ICD-10-CM | POA: Insufficient documentation

## 2010-08-05 ENCOUNTER — Emergency Department (HOSPITAL_COMMUNITY): Payer: Medicare Other

## 2010-08-05 LAB — DIFFERENTIAL
Basophils Relative: 0 % (ref 0–1)
Eosinophils Absolute: 0.1 10*3/uL (ref 0.0–0.7)
Neutrophils Relative %: 63 % (ref 43–77)

## 2010-08-05 LAB — CBC
Platelets: 305 10*3/uL (ref 150–400)
RBC: 4.59 MIL/uL (ref 3.87–5.11)
WBC: 12.8 10*3/uL — ABNORMAL HIGH (ref 4.0–10.5)

## 2010-08-05 LAB — BASIC METABOLIC PANEL
BUN: 27 mg/dL — ABNORMAL HIGH (ref 6–23)
CO2: 25 mEq/L (ref 19–32)
Calcium: 9.3 mg/dL (ref 8.4–10.5)
Creatinine, Ser: 1.26 mg/dL — ABNORMAL HIGH (ref 0.50–1.10)

## 2010-08-05 LAB — CK TOTAL AND CKMB (NOT AT ARMC)
Relative Index: 1.8 (ref 0.0–2.5)
Total CK: 195 U/L — ABNORMAL HIGH (ref 7–177)

## 2010-08-05 LAB — TROPONIN I: Troponin I: <0.3 ng/mL (ref <0.30)

## 2010-08-16 ENCOUNTER — Encounter: Payer: Self-pay | Admitting: Internal Medicine

## 2010-08-16 ENCOUNTER — Ambulatory Visit (INDEPENDENT_AMBULATORY_CARE_PROVIDER_SITE_OTHER): Payer: Medicare Other | Admitting: Internal Medicine

## 2010-08-16 VITALS — BP 133/89 | HR 87 | Ht 65.0 in | Wt 187.0 lb

## 2010-08-16 DIAGNOSIS — Z950 Presence of cardiac pacemaker: Secondary | ICD-10-CM

## 2010-08-16 DIAGNOSIS — I495 Sick sinus syndrome: Secondary | ICD-10-CM

## 2010-08-16 DIAGNOSIS — I4891 Unspecified atrial fibrillation: Secondary | ICD-10-CM

## 2010-08-16 DIAGNOSIS — R079 Chest pain, unspecified: Secondary | ICD-10-CM | POA: Insufficient documentation

## 2010-08-16 NOTE — Assessment & Plan Note (Signed)
She had an episode of very worrisome sounding chest pain about 2 weeks ago. Her cardiac evaluation is as outlined above. I would recommend that we undertake repeat nuclear scanning.

## 2010-08-16 NOTE — Patient Instructions (Signed)
Your physician recommends that you schedule a follow-up appointment in: 6  Months with Gunnar Fusi  Your physician recommends that you schedule a follow-up appointment in: 1 year with Dr Graciela Husbands  Your physician has requested that you have a lexiscan myoview. For further information please visit [email or web address] Please follow /instruction sheet, as given.   08/23/10 at 9:45 please arrive by 9:15 for check in

## 2010-08-16 NOTE — Progress Notes (Signed)
  HPI  [name] is a 77 y.o. female In followup for syncope in the setting of atrial fibrillation  with probable tachybrady syndrome,.  she is status post pacemaker implantation for chromotropic incompetence. This was accomplished in June 2010  She ws seen a month ago with poorlycontrolled AF and bisoprolol was started  This has been assoc with improvement in exercise tolerance.    She Has intercurrently been seen at [facility] because of an episode of significant chest pain associated with diaphoresis.  She was discharged with a presumptive diagnosis of hiatal hernia. She noted on catheterization 2002 demonstrating nonobstructive 20% disease. A Myoview in 2009 was low risk.   As best as I can tell  that evaluation a single set of cardiac enzymes were drawn; no electrocardiogram is in the EMR     Past Medical History  Diagnosis Date  . Paroxysmal atrial fibrillation     tachy-brady syndrome  . Hypertension   . Hyperlipidemia   . GERD (gastroesophageal reflux disease)   . Chest pain     with cath 2002 showing mild non-obstructive CAD  . Osteoporosis   . Dyspnea     chronic; myoview 11/9. EF 81% probable soft-tissue attenuation in anterior and lat walls, can't exclude ischemia  . Obesity     Past Surgical History  Procedure Date  . Cardiac catheterization   . Breast lumpectomy     right  . Total abdominal hysterectomy w/ bilateral salpingoophorectomy 1974  . Colectomy 1991  . Pacemaker insertion     St Jude    Current Outpatient Prescriptions  Medication Sig Dispense Refill  . bisoprolol (ZEBETA) 10 MG tablet Take 1 tablet (10 mg total) by mouth daily.  30 tablet  12  . diltiazem (CARDIZEM CD) 120 MG 24 hr capsule Take 1 capsule (120 mg total) by mouth daily.  30 capsule  11  . fish oil-omega-3 fatty acids 1000 MG capsule Take 2 g by mouth daily.        [name] lisinopril-hydrochlorothiazide (PRINZIDE,ZESTORETIC) 10-12.5 MG per tablet Take 1 tablet by mouth daily.        [name]  omeprazole (PRILOSEC) 20 MG capsule Take 20 mg by mouth daily.        . potassium chloride (KLOR-CON) 20 MEQ packet Take 20 mEq by mouth daily as needed.        [name] rOPINIRole (REQUIP) 0.25 MG tablet Take 1 tablet by mouth At bedtime.      . simvastatin (ZOCOR) 20 MG tablet Take 1 tablet (20 mg total) by mouth at bedtime.  30 tablet  12  . warfarin (COUMADIN) 4 MG tablet Take 4 mg by mouth. Use as directed by Anticoagulation Clinic         Allergies  Allergen Reactions  . Morphine And Related     Review of Systems negative except from HPI and PMH  Physical Exam Well developed and well nourished in no acute distress HENT normal E scleral and icterus clear Neck Supple JVP flat; carotids brisk and full Marked kyphosis Clear to ausculation Regular rate and rhythm, no murmurs gallops or rub Soft with active bowel sounds No clubbing cyanosis and edema Alert and oriented, grossly normal motor and sensory function Skin Warm and Dry    Assessment and  Plan

## 2010-08-16 NOTE — Assessment & Plan Note (Signed)
PermanentWith now much improved rate control on a higher dose of bisoprolol

## 2010-08-16 NOTE — Assessment & Plan Note (Signed)
Some bradycardia was 22% ventricular pacing

## 2010-08-16 NOTE — Assessment & Plan Note (Signed)
The patient's device was interrogated.  The information was reviewed. No changes were made in the programming.    

## 2010-08-18 ENCOUNTER — Encounter: Payer: Self-pay | Admitting: *Deleted

## 2010-08-23 ENCOUNTER — Ambulatory Visit (HOSPITAL_COMMUNITY): Payer: Medicare Other | Attending: Internal Medicine | Admitting: Radiology

## 2010-08-23 DIAGNOSIS — R0602 Shortness of breath: Secondary | ICD-10-CM

## 2010-08-23 DIAGNOSIS — R079 Chest pain, unspecified: Secondary | ICD-10-CM | POA: Insufficient documentation

## 2010-08-23 DIAGNOSIS — R55 Syncope and collapse: Secondary | ICD-10-CM

## 2010-08-23 DIAGNOSIS — I4891 Unspecified atrial fibrillation: Secondary | ICD-10-CM | POA: Insufficient documentation

## 2010-08-23 MED ORDER — TECHNETIUM TC 99M TETROFOSMIN IV KIT
33.0000 | PACK | Freq: Once | INTRAVENOUS | Status: AC | PRN
  Administered 2010-08-23: 33 via INTRAVENOUS

## 2010-08-23 MED ORDER — REGADENOSON 0.4 MG/5ML IV SOLN
0.4000 mg | Freq: Once | INTRAVENOUS | Status: AC
  Administered 2010-08-23: 0.4 mg via INTRAVENOUS

## 2010-08-23 MED ORDER — TECHNETIUM TC 99M TETROFOSMIN IV KIT
11.0000 | PACK | Freq: Once | INTRAVENOUS | Status: AC | PRN
  Administered 2010-08-23: 11 via INTRAVENOUS

## 2010-08-23 NOTE — Progress Notes (Signed)
[facility] SITE 3 NUCLEAR MED [address] [place] [address] [place] [address] [phone]  Cardiology Nuclear Med Study  [name] is a 77 y.o. female [id number] 1923-10-10   Nuclear Med Background Indication for Stress Test:  Evaluation for Ischemia and 08/05/10 Post [facility] with CP & diaphoretic; cardiac enzymes negative.  History: '11 Echo: EF 55-60% MVR, '04 Heart Catheterization: mild N/O cad, '09 Myocardial Perfusion Study: EF 81%, '10 Pacemaker: Tachy/Brady syndrome and Afib Cardiac Risk Factors: Family History - CAD, Hypertension, Lipids and Obesity  Symptoms:  DOE, Fatigue, Palpitations, Rapid HR and SOB   Nuclear Pre-Procedure Caffeine/Decaff Intake:  9:00pm NPO After: 9:00pm   Lungs:  clear IV 0.9% NS with Angio Cath:  22g  IV Site: R Hand  IV Started by:  Cathlyn Parsons, RN  Chest Size (in):  42 Cup Size: D  Height: 5' (1.524 m)  Weight:  187 lb (84.823 kg)  BMI:  Body mass index is 36.52 kg/(m^2). Tech Comments:  Bisoprolol held x 24hrs    Nuclear Med Study 1 or 2 day study: 1 day  Stress Test Type:  Eugenie Birks  Reading MD: Dietrich Pates, MD  Order Authorizing Provider:  S.Klein  Resting Radionuclide: Technetium 59m Tetrofosmin  Resting Radionuclide Dose: 11 mCi   Stress Radionuclide:  Technetium 70m Tetrofosmin  Stress Radionuclide Dose: 33 mCi           Stress Protocol Rest HR: 73 Stress HR: 82  Rest BP: 126/93 Stress BP: 129/93  Exercise Time (min): No exercise METS: No exercise   Predicted Max HR: 136 bpm % Max HR: 58.82 bpm Rate Pressure Product: 21308   Dose of Adenosine (mg):  n/a Dose of Lexiscan: 0.4 mg  Dose of Atropine (mg): n/a Dose of Dobutamine: n/a mcg/kg/min (at max HR)  Stress Test Technologist: Milana Na, EMT-P  Nuclear Technologist:  Domenic Polite, CNMT     Rest Procedure:  Myocardial perfusion imaging was performed at rest 45 minutes following the intravenous administration of Technetium 64m Tetrofosmin. Rest  ECG: Atrial Fibrilliation  Stress Procedure:  The patient received IV Lexiscan 0.4 mg over 15-seconds.  Technetium 45m Tetrofosmin injected at 30-seconds.  There were no significant changes and rare pvcs with Lexiscan.  Quantitative spect images were obtained after a 45 minute delay. Stress ECG: No significant change from baseline ECG  QPS Raw Data Images:  Extensive soft tissue (diaphragm, breast) surround heart. Stress Images: Distal anterior, mid/distal anteroseptal and apical defect.  Thinning in the distal inferior wall. Rest Images:  Near normalization of counts in the distal anterior, distal anteroseptal, distal inferior and apical distribution Subtraction (SDS):  These findings are consistent with ischemia. Transient Ischemic Dilatation (Normal <1.22): .90 Lung/Heart Ratio (Normal <0.45):  .42  Quantitative Gated Spect Images QGS EDV:  n/a ml QGS ESV:  n/a ml QGS cine images:NOt gated. QGS EF: Study not gated  Impression Exercise Capacity:  Lexiscan only. BP Response:  Normal blood pressure response. Clinical Symptoms:  No chest pain. ECG Impression:  No significant ST segment change suggestive of ischemia. Comparison with Prior Nuclear Study: Images not available for review.  Report mentions soft tissue attenuation vs small anterior and lateral infarct.  Overall Impression: Ischemia in the distal anterior, distal anteroseptal, distal inferior and apical distribution.  Cannot completely exlude shifting soft tissue (breast)  Dietrich Pates

## 2010-08-24 NOTE — Progress Notes (Signed)
nuc med report routed to Dr. Graciela Husbands 08/24/10 [name]

## 2010-08-27 NOTE — Progress Notes (Signed)
PTS stress test is quite abnormal; it is concerning in light of her prior chest pain adn i think she should undergo catheterization which can be done in the outpatient lab Can we set her up to see Scott for workup Thanks steve

## 2010-09-03 NOTE — Progress Notes (Signed)
I left a message for the patient to call on Monday.

## 2010-09-06 ENCOUNTER — Encounter: Payer: Self-pay | Admitting: *Deleted

## 2010-09-06 ENCOUNTER — Encounter: Payer: Self-pay | Admitting: Physician Assistant

## 2010-09-06 ENCOUNTER — Telehealth: Payer: Self-pay | Admitting: Internal Medicine

## 2010-09-06 ENCOUNTER — Ambulatory Visit (INDEPENDENT_AMBULATORY_CARE_PROVIDER_SITE_OTHER): Payer: Medicare Other | Admitting: Physician Assistant

## 2010-09-06 VITALS — BP 109/83 | HR 90 | Resp 16 | Ht 65.0 in | Wt 190.0 lb

## 2010-09-06 DIAGNOSIS — R9430 Abnormal result of cardiovascular function study, unspecified: Secondary | ICD-10-CM | POA: Insufficient documentation

## 2010-09-06 DIAGNOSIS — R079 Chest pain, unspecified: Secondary | ICD-10-CM

## 2010-09-06 DIAGNOSIS — N189 Chronic kidney disease, unspecified: Secondary | ICD-10-CM

## 2010-09-06 DIAGNOSIS — Z950 Presence of cardiac pacemaker: Secondary | ICD-10-CM

## 2010-09-06 DIAGNOSIS — I10 Essential (primary) hypertension: Secondary | ICD-10-CM

## 2010-09-06 DIAGNOSIS — I4891 Unspecified atrial fibrillation: Secondary | ICD-10-CM

## 2010-09-06 MED ORDER — BISOPROLOL FUMARATE 10 MG PO TABS: ORAL_TABLET | ORAL | Status: DC

## 2010-09-06 MED ORDER — BISOPROLOL FUMARATE 10 MG PO TABS: 10.0000 mg | ORAL_TABLET | Freq: Every day | ORAL | Status: DC

## 2010-09-06 MED ORDER — ISOSORBIDE MONONITRATE ER 30 MG PO TB24: 30.0000 mg | ORAL_TABLET | Freq: Every day | ORAL | Status: DC

## 2010-09-06 NOTE — Telephone Encounter (Signed)
After speaking with Lorin Picket, I called pt and discussed stress test results and recommendations per Dr. Graciela Husbands with pt.  She is agreeable to come in and see Lorin Picket today for a 2:30pm work-up appt.  She would like to know if she can have the outpatient catheterization tomorrow?  She will discuss this with Lorin Picket. Mylo Red RN

## 2010-09-06 NOTE — Assessment & Plan Note (Signed)
As noted, arrange cardiac catheterization as an outpatient.

## 2010-09-06 NOTE — Assessment & Plan Note (Addendum)
GFR is approximately 40-45.  As noted, I will hold her ACE inhibitor and diuretic.  As noted, she may need hydration prior to her cardiac catheterization.  We will make sure that she has minimal dye used as well as no left ventriculogram.  09/08/10:  Of note, her Creatinine was 1.1 on pre cath labs.  As noted, her ACE and diuretic have been held.  She should be fine to proceed without pre-hydration.

## 2010-09-06 NOTE — Progress Notes (Signed)
History of Present Illness: Primary Cardiologist:  Dr. Arvilla Meres Primary Electrophysiologist:  Dr. Sherryl Manges  [name] is a 77 y.o. female with a history of syncope in the setting of atrial fibrillation and tachybradycardia syndrome, status post pacemaker implantation in 2010, chronic dyspnea, hypertension, hyperlipidemia and acid reflux.  She had a cardiac catheterization both in 2002 and 2004 demonstrating nonobstructive disease with 20-30% plaque in the LAD, antero-lateral branch, circumflex and RCA.  Her EF has been preserved.  Last echocardiogram 6/11: EF 55-60%, mild MR, moderate LAE, mild RAE.  She was seen by Dr. Graciela Husbands Recently for followup.  She had been in the emergency room July 5 with worrisome sounding chest pain.  Hemoglobin was 14.6, creatinine was 1.26.  Dr. Graciela Husbands arranged for her to have a Myoview.  This was done 7/23 and demonstrated distal anterior, distal anteroseptal, distal inferior and apical ischemia.  Dr. Graciela Husbands asked that she return to be set up for an outpatient cardiac catheterization to further evaluate her abnormal Myoview.  She denies any change in her symptoms since seen by Dr. Graciela Husbands.  She does describe chest heaviness with exertion.  She has chronic dyspnea with exertion.  She denies any rest symptoms.  She denies syncope.  She does note increased palpitations with activity.  No orthopnea, PND or pedal edema.  Past Medical History  Diagnosis Date  . Paroxysmal atrial fibrillation     tachy-brady syndrome  . Hypertension   . Hyperlipidemia   . GERD (gastroesophageal reflux disease)   . Chest pain     with cath 2002 and 2004 showing mild non-obstructive CAD  . Osteoporosis   . Dyspnea     chronic; myoview 11/9. EF 81% probable soft-tissue attenuation in anterior and lat walls, can't exclude ischemia  . Obesity     Current Outpatient Prescriptions  Medication Sig Dispense Refill  . diltiazem (CARDIZEM CD) 120 MG 24 hr capsule Take 1 capsule (120  mg total) by mouth daily.  30 capsule  11  . fish oil-omega-3 fatty acids 1000 MG capsule Take 2 g by mouth daily.        [name] omeprazole (PRILOSEC) 20 MG capsule Take 20 mg by mouth daily.        [name] rOPINIRole (REQUIP) 0.25 MG tablet Take 1 tablet by mouth At bedtime.      . simvastatin (ZOCOR) 20 MG tablet Take 1 tablet (20 mg total) by mouth at bedtime.  30 tablet  12  . warfarin (COUMADIN) 4 MG tablet Take 4 mg by mouth. Use as directed by Anticoagulation Clinic       . DISCONTD: bisoprolol (ZEBETA) 10 MG tablet Take 1 tablet (10 mg total) by mouth daily.  30 tablet  12  . bisoprolol (ZEBETA) 10 MG tablet Take one tablet twice a day  60 tablet  6  . isosorbide mononitrate (IMDUR) 30 MG 24 hr tablet Take 1 tablet (30 mg total) by mouth daily.  30 tablet  6  . DISCONTD: bisoprolol (ZEBETA) 10 MG tablet Take 1 tablet (10 mg total) by mouth daily.  60 tablet  6    Allergies: Allergies  Allergen Reactions  . Morphine And Related     Social history:  Nonsmoker  Family history:  Negative for premature CAD  ROS:  Please see the history of present illness.  All other systems reviewed and negative.   Vital Signs: BP 109/83  Pulse 90  Resp 16  Ht 5\' 5"  (1.651 m)  Wt  190 lb (86.183 kg)  BMI 31.62 kg/m2  PHYSICAL EXAM: Well nourished, well developed, in no acute distress HEENT: normal Neck: no JVD Endocrine: no thyromegaly Cardiac:  normal S1, S2; Irregularly irregular rhythm, no murmur Lungs:  clear to auscultation bilaterally, no wheezing, rhonchi or rales Abd: soft, nontender, no hepatomegaly Ext: no edema Musculoskeletal: Marked kyphosis noted Vascular: Femoral arteries difficult to palpate but no bruits are auscultated Skin: warm and dry Neuro:  CNs 2-12 intact, no focal abnormalities noted Psych: Normal affect  ASSESSMENT AND PLAN:

## 2010-09-06 NOTE — Assessment & Plan Note (Signed)
She had recent followup with Dr. Graciela Husbands.

## 2010-09-06 NOTE — Assessment & Plan Note (Addendum)
Her rate is somewhat uncontrolled.  In getting up from the chair to the exam table, her heart rate went up to 120 by auscultation.  I will adjust her bisoprolol to 10 mg twice a day for both heart rate control as well as an antianginal.  She has a history of questionable stroke.  As noted, she will need Lovenox coverage while off of Coumadin for her cardiac catheterization.  Her INR is 2 today.  She could hold her coumadin starting today and start Lovenox tomorrow.  With her GFR, her dose would be 120 mg QD or 80 mg BID.  Her coumadin is managed by her PCP.  I have tried to reach his office today, but the phone lines are down.  As soon as I can speak with him today or tomorrow and arrange for her Lovenox to be set up, we can schedule her cath later this week. I went ahead and told her to just hold tonight's dose so that we can proceed later this week (if possible).    On 09/07/10, I did speak with Dr. Yetta Flock.  He would like [place] to get the patient started on Lovenox through our office.  I spoke with our pharmacist who will be in touch with the patient.  She will take 80 mg BID.  She will be asked to go to her PCP's office on Thursday 8/9 for a repeat INR and this will be faxed/called to our office.  Post cath, she should be able to follow up with her PCP for INR draws and dosing of coumadin and stop her Lovenox once her INR is 2.

## 2010-09-06 NOTE — Patient Instructions (Addendum)
Do not take Lisinopril/HCTZ.  Do not take KCL(potassium).  Increase Bisoprolol to 10mg  twice a day.  Start Imdur(Isosorbide) 30mg  daily.  Lab today--BMP/CBC  427.31  You will need to be off Coumadin for the cardiac catheterization. You will need to take Lovenox injections while you are off Coumadin. DO NOT TAKE COUMADIN TODAY. We will call you about further coumadin instructions after talking with Dr Yetta Flock.  We will call you about the time for the cardiac catheterization based on the lab results done today.

## 2010-09-06 NOTE — Assessment & Plan Note (Addendum)
She describes symptoms that sound consistent with exertional angina.  She also has a fairly abnormal Myoview.  As noted, Dr. Graciela Husbands has reviewed this and suggest that she proceed with cardiac catheterization.  She has not had any change in her symptoms since last being seen.  In reviewing her records, she does have a mildly elevated creatinine.  I have suggested that she hold her lisinopril/HCTZ and potassium for now.  We will check her pre-catheter labs today.  I may need to get her to come in for hydration pre cath depending on what her creatinine is today.  She will also need coverage with Lovenox while off of Coumadin.  I will place her on isosorbide 30 mg a day.  She knows to go to the emergency room if she has any change in her symptoms.  Risks and benefits of cardiac catheterization have been discussed with the patient.  These include bleeding, infection, kidney damage, stroke, heart attack, death.  The patient understands these risks and is willing to proceed.

## 2010-09-06 NOTE — Telephone Encounter (Signed)
Pt rtn call from [facility]

## 2010-09-06 NOTE — Assessment & Plan Note (Signed)
Controlled.  

## 2010-09-07 ENCOUNTER — Encounter: Payer: Self-pay | Admitting: *Deleted

## 2010-09-07 ENCOUNTER — Telehealth: Payer: Self-pay | Admitting: Pharmacist

## 2010-09-07 LAB — BASIC METABOLIC PANEL
Calcium: 9.7 mg/dL (ref 8.4–10.5)
Creatinine, Ser: 1.1 mg/dL (ref 0.4–1.2)
GFR: 49.65 mL/min — ABNORMAL LOW (ref >60.00)
Glucose, Bld: 99 mg/dL (ref 70–99)
Sodium: 142 mEq/L (ref 135–145)

## 2010-09-07 LAB — CBC WITH DIFFERENTIAL/PLATELET
Basophils Relative: 1.5 % (ref 0.0–3.0)
Eosinophils Relative: 2 % (ref 0.0–5.0)
Hemoglobin: 15.3 g/dL — ABNORMAL HIGH (ref 12.0–15.0)
Lymphs Abs: 4.3 10*3/uL — ABNORMAL HIGH (ref 0.7–4.0)
MCHC: 34 g/dL (ref 30.0–36.0)
Monocytes Absolute: 1.4 10*3/uL — ABNORMAL HIGH (ref 0.1–1.0)

## 2010-09-07 MED ORDER — ENOXAPARIN SODIUM 80 MG/0.8ML ~~LOC~~ SOLN: 80.0000 mg | Freq: Two times a day (BID) | SUBCUTANEOUS | Status: DC

## 2010-09-07 NOTE — Telephone Encounter (Signed)
09/07/10 (Tuesday): No Coumadin, Lovenox 80mg  09/08/10 (Wednesday): No Coumadin, Lovenox 80mg  at 6:30am & 6:30pm 09/09/10 (Thursday): No Coumadin, Lovenox 80mg  at 6:30am only (Do not inject in the evening); Get INR Checked by PCP and have them fax/call it to [place]  09/10/10 (Friday): Cath procedure scheduled; No Coumadin, No Lovenox  **Restart Lovenox and coumadin when doctor tells you to restart** Will restart Lovenox 80mg  Q12H until INR>2.0

## 2010-09-08 ENCOUNTER — Telehealth: Payer: Self-pay | Admitting: *Deleted

## 2010-09-08 ENCOUNTER — Ambulatory Visit: Payer: Medicare Other | Admitting: Physician Assistant

## 2010-09-08 NOTE — Telephone Encounter (Signed)
Told patient to restart the Lovenox and normal dose of Coumadin post-cath once the physicians ok the coumadin-restart and that she will need to follow-up with her PCP Charlott Rakes [phone]) within 5 days to recheck her INR. She is aware that she will need the coumadin-lovenox bridge until her INR >2.0.

## 2010-09-08 NOTE — Telephone Encounter (Signed)
I spoke with the patient and she verbalizes understanding of the changes made for her cath on Friday.

## 2010-09-08 NOTE — Telephone Encounter (Signed)
Call received from Trish, cardmaster, stating that the patient was scheduled for a JV cath on Friday, but due to her labs (elevated GFR and INR), she will need further f/u of her labs on Friday. The decision has been made on the hospital end that the patient be moved to the main cath lab. Per Rosann Auerbach she just needed someone to contact the patient and let her know her cath has been moved from the JV lab to the main lab on Friday 09/10/10 at 11:30am. She will need to be there at 9:30am at [facility]. I have left a message for the patient to call.

## 2010-09-10 ENCOUNTER — Ambulatory Visit (HOSPITAL_COMMUNITY)
Admission: RE | Admit: 2010-09-10 | Discharge: 2010-09-10 | Disposition: A | Discharge status: Home / Self Care | Payer: Medicare Other | Source: Ambulatory Visit | Attending: Internal Medicine | Admitting: Internal Medicine

## 2010-09-10 DIAGNOSIS — Z01810 Encounter for preprocedural cardiovascular examination: Secondary | ICD-10-CM | POA: Insufficient documentation

## 2010-09-10 DIAGNOSIS — I2510 Atherosclerotic heart disease of native coronary artery without angina pectoris: Secondary | ICD-10-CM

## 2010-09-10 DIAGNOSIS — R9439 Abnormal result of other cardiovascular function study: Secondary | ICD-10-CM | POA: Insufficient documentation

## 2010-09-10 DIAGNOSIS — R079 Chest pain, unspecified: Secondary | ICD-10-CM | POA: Insufficient documentation

## 2010-09-10 LAB — BASIC METABOLIC PANEL
BUN: 15 mg/dL (ref 6–23)
CO2: 24 mEq/L (ref 19–32)
Chloride: 106 mEq/L (ref 96–112)
Creatinine, Ser: 0.89 mg/dL (ref 0.50–1.10)
Glucose, Bld: 98 mg/dL (ref 70–99)

## 2010-09-13 ENCOUNTER — Telehealth: Payer: Self-pay | Admitting: *Deleted

## 2010-09-13 NOTE — Telephone Encounter (Signed)
Pt states was not told to stay on both coumadin and lovenox, she will start back on lovenox today along with her coumadin. pcp appt 09/16/10. Told pt to stay on both medications until INR 2 or >. Danielle Rankin

## 2010-09-14 ENCOUNTER — Encounter: Payer: Self-pay | Admitting: *Deleted

## 2010-09-20 NOTE — Cardiovascular Report (Signed)
  NAME:  [name], [name] NO.:  [id number]  MEDICAL RECORD NO.:  [id number]  LOCATION:  [facility]                         FACILITY:  [facility]  PHYSICIAN:  Peter M. Swaziland, M.D.  DATE OF BIRTH:  06/25/1923  DATE OF PROCEDURE:  09/10/2010 DATE OF DISCHARGE:  09/10/2010                           CARDIAC CATHETERIZATION   INDICATIONS FOR PROCEDURE:  An 77 year old white female with history of atrial fibrillation and tachybrady syndrome.  She has a history of nonobstructive coronary artery disease.  She recently presented with symptoms of chest pain.  A Myoview study demonstrated evidence of distal anterior, anteroseptal, and apical ischemia.  PROCEDURE:  Left heart catheterization, coronary and left ventricular angiography.  ACCESS:  Via the right femoral artery using standard Seldinger technique.  EQUIPMENT:  A 5-French 4-cm right and left Judkins catheter, 5-French pigtail catheter.  MEDICATIONS:  Local anesthesia 1% Xylocaine, Versed 1 mg IV.  CONTRAST:  Omnipaque 80 mL.  HEMODYNAMIC DATA:  Aortic pressure is 132/74 with a mean of 100 mmHg, left ventricular pressure is 139 with an EDP of 16 mmHg.  ANGIOGRAPHIC DATA:  The left coronary artery arises and distributes normally.  The left main coronary artery is normal.  The left anterior descending artery has 20% narrowing in the proximal and midvessel.  The first diagonal branch has a 40% ostial stenosis.  The left circumflex coronary artery has mild irregularities up to 10- 20%.  The right coronary artery is a dominant vessel.  It has mild wall irregularities less than or equal to 10%.  Left ventricular angiography was performed in the RAO view.  This demonstrates normal left ventricular size and contractility with normal systolic function.  Ejection fraction is estimated at 60%.  FINAL INTERPRETATION: 1. Nonobstructive coronary artery disease. 2. Normal left ventricular function.  PLAN:  Would recommend  continued medical therapy.          ______________________________ Peter M. [place]iland, M.D.     PMJ/MEDQ  D:  09/10/2010  T:  09/11/2010  Job:  [id number]  cc:   Duke Salvia, MD, [facility] Bevelyn Buckles. Bensimhon, MD  Electronically Signed by PETER Swaziland M.D. on 09/20/2010 09:55:57 AM

## 2010-11-03 LAB — POCT CARDIAC MARKERS: Troponin i, poc: <0.05

## 2010-11-03 LAB — BASIC METABOLIC PANEL
BUN: 12
BUN: 13
Calcium: 9.3
Calcium: 9.3
Creatinine, Ser: 0.75
Creatinine, Ser: 0.79
GFR calc Af Amer: >60
GFR calc non Af Amer: >60
Potassium: 3.6

## 2010-11-03 LAB — CBC
HCT: 45.7
Hemoglobin: 15.6 — ABNORMAL HIGH
MCV: 95.6
Platelets: 333
RBC: 4.42
RBC: 4.78
WBC: 10.1
WBC: 9.6

## 2010-11-03 LAB — CK TOTAL AND CKMB (NOT AT ARMC)
CK, MB: 4
Relative Index: 1.6
Total CK: 250 — ABNORMAL HIGH

## 2010-11-03 LAB — CARDIAC PANEL(CRET KIN+CKTOT+MB+TROPI)
CK, MB: 3.3
Relative Index: 1.6
Total CK: 203 — ABNORMAL HIGH
Troponin I: 0.01
Troponin I: 0.02

## 2010-11-03 LAB — COMPREHENSIVE METABOLIC PANEL
AST: 28
BUN: 15
CO2: 26
Chloride: 105
Creatinine, Ser: 1.02
GFR calc non Af Amer: 52 — ABNORMAL LOW
Total Bilirubin: 0.8

## 2010-11-03 LAB — TSH: TSH: 4.271

## 2010-11-03 LAB — TROPONIN I: Troponin I: 0.03

## 2010-11-03 LAB — HEPARIN LEVEL (UNFRACTIONATED): Heparin Unfractionated: 1.04 — ABNORMAL HIGH

## 2010-11-03 LAB — DIFFERENTIAL
Basophils Absolute: 0.2 — ABNORMAL HIGH
Eosinophils Relative: 2
Lymphocytes Relative: 47 — ABNORMAL HIGH
Neutro Abs: 3.3

## 2010-11-03 LAB — D-DIMER, QUANTITATIVE: D-Dimer, Quant: 0.58 — ABNORMAL HIGH

## 2010-11-03 LAB — PROTIME-INR: Prothrombin Time: 14.9

## 2011-03-18 ENCOUNTER — Telehealth: Payer: Self-pay | Admitting: Internal Medicine

## 2011-03-18 NOTE — Telephone Encounter (Signed)
03-18-11 lmm @ 1232 for pt to set up pacer ck with device/mt

## 2011-04-05 ENCOUNTER — Other Ambulatory Visit: Payer: Self-pay | Admitting: Internal Medicine

## 2011-04-14 ENCOUNTER — Telehealth: Payer: Self-pay | Admitting: Internal Medicine

## 2011-04-14 NOTE — Telephone Encounter (Signed)
They need clarification on the generic zebeta because the requst was sent back to them stating refill not appropriate and they are not sure why it says that

## 2011-04-15 NOTE — Telephone Encounter (Signed)
Refill - F/U  Patient call to verify status on Zebeta RX refill, she can be reached at hm# [phone]

## 2011-04-18 MED ORDER — BISOPROLOL FUMARATE 10 MG PO TABS: ORAL_TABLET | ORAL | Status: DC

## 2011-04-18 NOTE — Telephone Encounter (Signed)
Fu call Pt wants to get refill of zebeta. She said she is out of this med. She said she is not supposed to see Dr Graciela Husbands until July and could not understand why she could not get her med. Please call her back

## 2011-04-18 NOTE — Telephone Encounter (Signed)
Spoke with patient and she states she has been taking Zebeta 10 mg twice a day for some time and feels good on this.  Reviewed chart and this is listed on medication list.  Will call pharmacy with ok.

## 2011-05-09 ENCOUNTER — Encounter: Payer: Self-pay | Admitting: Internal Medicine

## 2011-05-09 ENCOUNTER — Ambulatory Visit (HOSPITAL_COMMUNITY)
Admission: RE | Admit: 2011-05-09 | Discharge: 2011-05-09 | Disposition: A | Discharge status: Home / Self Care | Payer: Medicare Other | Source: Ambulatory Visit | Attending: Internal Medicine | Admitting: Internal Medicine

## 2011-05-09 VITALS — BP 122/74 | HR 80 | Wt 199.8 lb

## 2011-05-09 DIAGNOSIS — R0989 Other specified symptoms and signs involving the circulatory and respiratory systems: Secondary | ICD-10-CM | POA: Insufficient documentation

## 2011-05-09 DIAGNOSIS — Z950 Presence of cardiac pacemaker: Secondary | ICD-10-CM | POA: Insufficient documentation

## 2011-05-09 DIAGNOSIS — R0602 Shortness of breath: Secondary | ICD-10-CM

## 2011-05-09 DIAGNOSIS — I4891 Unspecified atrial fibrillation: Secondary | ICD-10-CM | POA: Insufficient documentation

## 2011-05-09 DIAGNOSIS — R0609 Other forms of dyspnea: Secondary | ICD-10-CM | POA: Insufficient documentation

## 2011-05-09 NOTE — Progress Notes (Signed)
Encounter addended by: Dolores Patty, MD on: 05/09/2011  3:26 PM<BR>     Documentation filed: Charges VN

## 2011-05-09 NOTE — Assessment & Plan Note (Addendum)
Remains in A fib. Device interrogated.   Patient seen and examined with Tonye Becket, NP. We discussed all aspects of the encounter. I agree with the assessment and plan as stated above.  Pacemaker reviewed with her in clinic. AF is chronic. Well rate controlled. Continue coumadin.

## 2011-05-09 NOTE — Progress Notes (Signed)
Encounter addended by: Noralee Space, RN on: 05/09/2011  2:58 PM<BR>     Documentation filed: Patient Instructions Section

## 2011-05-09 NOTE — Assessment & Plan Note (Addendum)
Will schedule follow up with Dr Graciela Husbands. Have arranged from home PM monitoring.

## 2011-05-09 NOTE — Patient Instructions (Signed)
We will contact you in 6 months to schedule your next appointment.  

## 2011-05-09 NOTE — Progress Notes (Signed)
Patient ID: [name], female   DOB: 07/17/1923, 78 y.o.   MRN: [id number] HPI:  Ms. Paisley is a delightful 78 year old woman with a history of syncope in the setting of atrial fibrillation c/b tachybrady syndrome s/p pacemaker placement in 2010, chronic dyspnea as well as hypertension and gastroesophageal reflux disease.  2011 Decreased bisprolol due to resting breadycardia and ordered a Myoview to further evaluate her chronic dyspnea. Myoview low risk. Had event monitor last year with PACs but no significant arrhythmias or pauses.   09/2010  LHC = Aortic pressure is 132/74 with a mean of 100 mmHg, left ventricular pressure is 139 with an EDP of 16 mmHg. L Main normal LAD 0% narrowing in the proximal and midvessel. The first diagonal branch has a 40% ostial stenosis. The left circumflex coronary artery has mild irregularities up to 10- 20%. The right coronary artery is a dominant vessel. It has mild wall irregularities less than or equal to 10%.   She returns for follow up. Dyspnea after walking 200 feet. Denies PND/orthopnea. Complains of lower extremity edema on occasion resolves with putting her feet up. No CP.  Complaint with medications. Coumadin monitored monthly. Denies bleeding problems.   ROS: All systems negative except as listed in HPI, PMH and Problem List.  Past Medical History  Diagnosis Date  . Paroxysmal atrial fibrillation     tachy-brady syndrome  . Hypertension   . Hyperlipidemia   . GERD (gastroesophageal reflux disease)   . Chest pain     with cath 2002 and 2004 showing mild non-obstructive CAD  . Osteoporosis   . Dyspnea     chronic; myoview 11/9. EF 81% probable soft-tissue attenuation in anterior and lat walls, can't exclude ischemia  . Obesity     Current Outpatient Prescriptions  Medication Sig Dispense Refill  . bisoprolol (ZEBETA) 10 MG tablet Take one tablet twice a day  60 tablet  3  . diltiazem (CARDIZEM CD) 120 MG 24 hr capsule Take 1 capsule (120 mg  total) by mouth daily.  30 capsule  11  . enoxaparin (LOVENOX) 80 MG/0.8ML SOLN Inject 0.8 mLs (80 mg total) into the skin every 12 (twelve) hours.  10 Syringe  1  . fish oil-omega-3 fatty acids 1000 MG capsule Take 2 g by mouth daily.        . IMDUR 30 MG 24 hr tablet TAKE 1 TABLET ONCE DAILY.  90 each  1  . omeprazole (PRILOSEC) 20 MG capsule Take 20 mg by mouth daily.        [name] rOPINIRole (REQUIP) 0.25 MG tablet Take 1 tablet by mouth At bedtime.      . simvastatin (ZOCOR) 20 MG tablet Take 1 tablet (20 mg total) by mouth at bedtime.  30 tablet  12  . warfarin (COUMADIN) 4 MG tablet Take 4 mg by mouth. Use as directed by Anticoagulation Clinic          PHYSICAL EXAM: Filed Vitals:   05/09/11 1357  BP: 122/74  Pulse: 80  Weight: 199 (190) General:  Elderly. +diffuse  tremor. No acute distress. HEENT: normal except for scar from skin cancer removal ont he bridge of her nose Neck: supple. no JVD. Carotids 2+ bilat; no bruits. No lymphadenopathy or thryomegaly appreciated. Cor: PMI nondisplaced. Irregular rate & rhythm. No rubs, gallops, murmur. Back + scoliosis/kyphosis. Lungs: clear Abdomen: soft, nontender, nondistended.  Good bowel sounds. Extremities: no cyanosis, clubbing, rash, edema Neuro: alert & orientedx3, cranial nerves grossly intact. moves  all 4 extremities w/o difficulty    ECG:  A-fib rate 73 occasional PVC  Pacemaker check in clinic: Chronic AF. Ventricular rates well controlled 95% < 100 bpm.     ASSESSMENT & PLAN:

## 2011-05-09 NOTE — Assessment & Plan Note (Addendum)
Attending:   Patient seen and examined with Tonye Becket, NP. We discussed all aspects of the encounter. I agree with the assessment and plan as stated above.  Likely multifactorial. Do not see much edema on exam. Ideally I think she would benefit from cardiac rehab or silver sneakers program. Have encouraged her to look into a program in [place]. She says one of the woman in her auxillary goes to a program and she will ask about it.

## 2011-05-10 NOTE — Progress Notes (Signed)
Encounter addended by: Simon Rhein on: 05/10/2011  7:04 AM<BR>     Documentation filed: Charges VN

## 2011-07-25 ENCOUNTER — Telehealth: Payer: Self-pay | Admitting: Internal Medicine

## 2011-07-25 NOTE — Telephone Encounter (Signed)
Please return call to [facility]  [phone]  To verify Cardizem dosage instructions.

## 2011-07-25 NOTE — Telephone Encounter (Signed)
Dose verified

## 2011-08-20 ENCOUNTER — Other Ambulatory Visit: Payer: Self-pay | Admitting: Internal Medicine

## 2011-10-05 ENCOUNTER — Other Ambulatory Visit: Payer: Self-pay

## 2011-10-05 MED ORDER — ISOSORBIDE MONONITRATE ER 30 MG PO TB24: 30.0000 mg | ORAL_TABLET | Freq: Every day | ORAL | Status: DC

## 2011-10-05 NOTE — Telephone Encounter (Signed)
Patient has a follow up with Dr. Ladona Ridgel on 10/18/2011. Patient is out of imdur.

## 2011-10-18 ENCOUNTER — Encounter: Payer: Self-pay | Admitting: Internal Medicine

## 2011-10-18 ENCOUNTER — Ambulatory Visit (INDEPENDENT_AMBULATORY_CARE_PROVIDER_SITE_OTHER): Payer: Medicare Other | Admitting: Internal Medicine

## 2011-10-18 VITALS — BP 142/92 | HR 93 | Ht 66.0 in | Wt 196.0 lb

## 2011-10-18 DIAGNOSIS — I5032 Chronic diastolic (congestive) heart failure: Secondary | ICD-10-CM

## 2011-10-18 DIAGNOSIS — I4891 Unspecified atrial fibrillation: Secondary | ICD-10-CM

## 2011-10-18 DIAGNOSIS — R0602 Shortness of breath: Secondary | ICD-10-CM

## 2011-10-18 DIAGNOSIS — Z950 Presence of cardiac pacemaker: Secondary | ICD-10-CM

## 2011-10-18 DIAGNOSIS — I495 Sick sinus syndrome: Secondary | ICD-10-CM

## 2011-10-18 LAB — PACEMAKER DEVICE OBSERVATION
AL AMPLITUDE: 0.3 mv
AL IMPEDENCE PM: 300 Ohm
AL THRESHOLD: 0.875 V
DEVICE MODEL PM: 2285053
RV LEAD IMPEDENCE PM: 625 Ohm
RV LEAD THRESHOLD: 1 V
VENTRICULAR PACING PM: 19

## 2011-10-18 MED ORDER — FUROSEMIDE 20 MG PO TABS: 20.0000 mg | ORAL_TABLET | Freq: Every day | ORAL | Status: DC

## 2011-10-18 MED ORDER — ISOSORBIDE MONONITRATE ER 30 MG PO TB24: 30.0000 mg | ORAL_TABLET | Freq: Every day | ORAL | Status: DC

## 2011-10-18 NOTE — Patient Instructions (Addendum)
Remote monitoring is used to monitor your Pacemaker of ICD from home. This monitoring reduces the number of office visits required to check your device to one time per year. It allows [place] to keep an eye on the functioning of your device to ensure it is working properly. You are scheduled for a device check from home on 01/23/12. You may send your transmission at any time that day. If you have a wireless device, the transmission will be sent automatically. After your physician reviews your transmission, you will receive a postcard with your next transmission date.  Your physician wants you to follow-up in: 1 year with Dr Ladona Ridgel and device. You will receive a reminder letter in the mail two months in advance. If you don't receive a letter, please call our office to schedule the follow-up appointment.  Your physician has recommended you make the following change in your medication: START Lasix 20mg  daily.

## 2011-10-18 NOTE — Assessment & Plan Note (Signed)
I've asked the patient to maintain a low-sodium diet. We'll start her on low-dose Lasix to see if her symptoms are improved. She may require as much as 40 mg a day of Lasix but I will start her today on 20 mg daily. She is encouraged to increase potassium in her diet.

## 2011-10-18 NOTE — Progress Notes (Signed)
HPI [name] returns today for followup. She is a very pleasant elderly woman with a history of chronic atrial fibrillation, hypertension, status post permanent pacemaker insertion. She denies anginal symptoms. She does note dyspnea with exertion. She has peripheral edema which is worse in the evenings but better in the mornings. She denies sodium indiscretion. No cough, fever, or hemoptysis. Allergies  Allergen Reactions  . Morphine And Related      Current Outpatient Prescriptions  Medication Sig Dispense Refill  . bisoprolol (ZEBETA) 10 MG tablet Take one tablet twice a day  60 tablet  3  . diltiazem (CARDIZEM CD) 120 MG 24 hr capsule Take 1 capsule (120 mg total) by mouth daily.  30 capsule  11  . fish oil-omega-3 fatty acids 1000 MG capsule Take 2 g by mouth daily as needed.       . isosorbide mononitrate (IMDUR) 30 MG 24 hr tablet Take 1 tablet (30 mg total) by mouth daily.  30 tablet  0  . omeprazole (PRILOSEC) 20 MG capsule Take 20 mg by mouth daily.        [name] rOPINIRole (REQUIP) 0.25 MG tablet Take 1 tablet by mouth At bedtime.      [name] warfarin (COUMADIN) 4 MG tablet Take 4 mg by mouth. Use as directed by Anticoagulation Clinic (3 mg alternating with 4 mg daily)      . ZOCOR 20 MG tablet TAKE 1 TABLET AT BEDTIME.  30 each  1     Past Medical History  Diagnosis Date  . Paroxysmal atrial fibrillation     tachy-brady syndrome  . Hypertension   . Hyperlipidemia   . GERD (gastroesophageal reflux disease)   . Chest pain     with cath 2002 and 2004 showing mild non-obstructive CAD  . Osteoporosis   . Dyspnea     chronic; myoview 11/9. EF 81% probable soft-tissue attenuation in anterior and lat walls, can't exclude ischemia  . Obesity     ROS:   All systems reviewed and negative except as noted in the HPI.   Past Surgical History  Procedure Date  . Cardiac catheterization   . Breast lumpectomy     right  . Total abdominal hysterectomy w/ bilateral salpingoophorectomy  1974  . Colectomy 1991  . Pacemaker insertion     [employer]     Family History  Problem Relation Age of Onset  . Cancer Other   . Heart disease Other   . Hypertension Other      History   Social History  . Marital Status: Married    Spouse Name: N/A    Number of Children: N/A  . Years of Education: N/A   Occupational History  . Not on file.   Social History Main Topics  . Smoking status: Never Smoker   . Smokeless tobacco: Not on file  . Alcohol Use: No  . Drug Use: No  . Sexually Active:    Other Topics Concern  . Not on file   Social History Narrative  . No narrative on file     BP 142/92  Pulse 93  Ht 5\' 6"  (1.676 m)  Wt 196 lb (88.905 kg)  BMI 31.64 kg/m2  Physical Exam:  Well appearing elderly woman, NAD HEENT: Unremarkable Neck:  7 cm JVD, no thyromegally Lungs:  Clear except for rales in the bases bilaterally. HEART:  IRegular rate rhythm, no murmurs, no rubs, no clicks Abd:  soft, obese, positive bowel sounds, no organomegally, no  rebound, no guarding Ext:  2 plus pulses, trace edema, no cyanosis, no clubbing Neuro:  CN II through XII intact, motor grossly intact  DEVICE  Normal device function.  See PaceArt for details.   Assess/Plan:

## 2011-10-18 NOTE — Assessment & Plan Note (Signed)
Her ventricular rates are somewhat elevated. I suspect she is volume overloaded. We'll undergo watchful waiting. She will continue her current medical therapy.

## 2011-10-18 NOTE — Assessment & Plan Note (Signed)
Her device is working normally. We'll plan to recheck in several months. Battery longevity is good. She appears to be in atrial fibrillation chronically.

## 2011-10-25 ENCOUNTER — Other Ambulatory Visit: Payer: Self-pay | Admitting: Internal Medicine

## 2011-11-30 ENCOUNTER — Other Ambulatory Visit: Payer: Self-pay | Admitting: Internal Medicine

## 2012-01-23 ENCOUNTER — Encounter: Payer: Self-pay | Admitting: Internal Medicine

## 2012-01-23 ENCOUNTER — Encounter: Payer: Medicare Other | Admitting: *Deleted

## 2012-01-24 ENCOUNTER — Encounter: Payer: Self-pay | Admitting: *Deleted

## 2012-01-30 ENCOUNTER — Telehealth: Payer: Self-pay | Admitting: Internal Medicine

## 2012-01-30 ENCOUNTER — Encounter: Payer: Self-pay | Admitting: Internal Medicine

## 2012-01-30 ENCOUNTER — Ambulatory Visit (INDEPENDENT_AMBULATORY_CARE_PROVIDER_SITE_OTHER): Payer: Medicare Other | Admitting: *Deleted

## 2012-01-30 DIAGNOSIS — I495 Sick sinus syndrome: Secondary | ICD-10-CM

## 2012-01-30 DIAGNOSIS — Z950 Presence of cardiac pacemaker: Secondary | ICD-10-CM

## 2012-01-30 NOTE — Telephone Encounter (Signed)
New Problem:    Patient called in because she believed that she sent a transmission on 01/23/12 but she still received a no show letter.  Please call back.

## 2012-01-31 NOTE — Telephone Encounter (Signed)
Transmission was received. Pt aware and knows how to send transmission.

## 2012-02-02 LAB — REMOTE PACEMAKER DEVICE
ATRIAL PACING PM: 1
BAMS-0001: 150 {beats}/min
BATTERY VOLTAGE: 2.95 V
RV LEAD AMPLITUDE: 12 mv
RV LEAD IMPEDENCE PM: 630 Ohm
VENTRICULAR PACING PM: 18

## 2012-02-14 ENCOUNTER — Encounter: Payer: Self-pay | Admitting: Internal Medicine

## 2012-02-14 ENCOUNTER — Ambulatory Visit (INDEPENDENT_AMBULATORY_CARE_PROVIDER_SITE_OTHER): Payer: Medicare PPO | Admitting: Internal Medicine

## 2012-02-14 VITALS — BP 120/78 | HR 77 | Ht 66.0 in | Wt 196.0 lb

## 2012-02-14 DIAGNOSIS — Z950 Presence of cardiac pacemaker: Secondary | ICD-10-CM

## 2012-02-14 DIAGNOSIS — I5032 Chronic diastolic (congestive) heart failure: Secondary | ICD-10-CM

## 2012-02-14 DIAGNOSIS — I4891 Unspecified atrial fibrillation: Secondary | ICD-10-CM

## 2012-02-14 DIAGNOSIS — I495 Sick sinus syndrome: Secondary | ICD-10-CM

## 2012-02-14 LAB — PACEMAKER DEVICE OBSERVATION
ATRIAL PACING PM: 1
BAMS-0001: 150 {beats}/min
BRDY-0002RV: 60 {beats}/min
DEVICE MODEL PM: 2285053
RV LEAD AMPLITUDE: 12 mv
RV LEAD IMPEDENCE PM: 640 Ohm
RV LEAD THRESHOLD: 1 V

## 2012-02-14 NOTE — Patient Instructions (Signed)
Your physician wants you to follow-up in: 1 year with Dr. Graciela Husbands. You will receive a reminder letter in the mail two months in advance. If you don't receive a letter, please call our office to schedule the follow-up appointment.  Remote monitoring is used to monitor your Pacemaker of ICD from home. This monitoring reduces the number of office visits required to check your device to one time per year. It allows [place] to keep an eye on the functioning of your device to ensure it is working properly. You are scheduled for a device check from home on 05/14/12. You may send your transmission at any time that day. If you have a wireless device, the transmission will be sent automatically. After your physician reviews your transmission, you will receive a postcard with your next transmission date.  Your physician recommends that you continue on your current medications as directed. Please refer to the Current Medication list given to you today.

## 2012-02-14 NOTE — Progress Notes (Signed)
Patient Care Team: [name] as PCP - General (Family Medicine) [name] as Referring Physician (Family Medicine)   HPI  [name] is a 79 y.o. female Seen in followup for pacer implanted for tachybrady and now with permanent EF  she is [name] grandmother.  She has no significant complaint except related to restless legs.   Past Medical History  Diagnosis Date  . Paroxysmal atrial fibrillation     tachy-brady syndrome  . Hypertension   . Hyperlipidemia   . GERD (gastroesophageal reflux disease)   . Chest pain     with cath 2002 and 2004 showing mild non-obstructive CAD  . Osteoporosis   . Dyspnea     chronic; myoview 11/9. EF 81% probable soft-tissue attenuation in anterior and lat walls, can't exclude ischemia  . Obesity     Past Surgical History  Procedure Date  . Cardiac catheterization   . Breast lumpectomy     right  . Total abdominal hysterectomy w/ bilateral salpingoophorectomy 1974  . Colectomy 1991  . Pacemaker insertion     St Jude    Current Outpatient Prescriptions  Medication Sig Dispense Refill  . bisoprolol (ZEBETA) 10 MG tablet TAKE 1 TABLET BY MOUTH TWICE DAILY.  60 tablet  12  . diltiazem (CARDIZEM CD) 120 MG 24 hr capsule Take 1 capsule (120 mg total) by mouth daily.  30 capsule  11  . fish oil-omega-3 fatty acids 1000 MG capsule Take 2 g by mouth daily as needed.       . furosemide (LASIX) 20 MG tablet Take 1 tablet (20 mg total) by mouth daily.  90 tablet  3  . isosorbide mononitrate (IMDUR) 30 MG 24 hr tablet Take 1 tablet (30 mg total) by mouth daily.  90 tablet  3  . omeprazole (PRILOSEC) 20 MG capsule Take 20 mg by mouth daily.        [name] rOPINIRole (REQUIP) 0.25 MG tablet Take 1 tablet by mouth At bedtime.      [name] warfarin (COUMADIN) 4 MG tablet Take 4 mg by mouth. Use as directed by Anticoagulation Clinic (3 mg alternating with 4 mg daily)      . ZOCOR 20 MG tablet TAKE 1 TABLET AT BEDTIME.  30 tablet  12     Allergies  Allergen Reactions  . Morphine And Related     Review of Systems negative except from HPI and PMH  Physical Exam BP 120/78  Pulse 77  Ht 5\' 6"  (1.676 m)  Wt 196 lb (88.905 kg)  BMI 31.64 kg/m2 Well developed and well nourished in no acute distress HENT normal E scleral and icterus clear Neck Supple Clear to ausculation  Regular rate and rhythm, no murmurs gallops or rub Soft with active bowel sounds No clubbing cyanosis none Edema Alert and oriented, grossly normal motor and sensory function Skin Warm and Dry    Assessment and  Plan

## 2012-02-14 NOTE — Assessment & Plan Note (Signed)
As above.

## 2012-02-14 NOTE — Assessment & Plan Note (Signed)
Permanent; on anticoagulation with reasonable rate control

## 2012-02-14 NOTE — Assessment & Plan Note (Signed)
The patient's device was interrogated.  The information was reviewed. No changes were made in the programming.    

## 2012-02-15 ENCOUNTER — Encounter: Payer: Medicare Other | Admitting: Internal Medicine

## 2012-02-20 ENCOUNTER — Observation Stay (HOSPITAL_COMMUNITY)
Admission: EM | Admit: 2012-02-20 | Discharge: 2012-02-22 | Disposition: A | Discharge status: Home / Self Care | Payer: Medicare PPO | Attending: Family Medicine | Admitting: Family Medicine

## 2012-02-20 ENCOUNTER — Emergency Department (HOSPITAL_COMMUNITY): Payer: Medicare PPO

## 2012-02-20 DIAGNOSIS — R0789 Other chest pain: Principal | ICD-10-CM | POA: Insufficient documentation

## 2012-02-20 DIAGNOSIS — I4891 Unspecified atrial fibrillation: Secondary | ICD-10-CM

## 2012-02-20 DIAGNOSIS — E669 Obesity, unspecified: Secondary | ICD-10-CM | POA: Insufficient documentation

## 2012-02-20 DIAGNOSIS — M810 Age-related osteoporosis without current pathological fracture: Secondary | ICD-10-CM | POA: Insufficient documentation

## 2012-02-20 DIAGNOSIS — I10 Essential (primary) hypertension: Secondary | ICD-10-CM | POA: Insufficient documentation

## 2012-02-20 DIAGNOSIS — I482 Chronic atrial fibrillation, unspecified: Secondary | ICD-10-CM | POA: Diagnosis present

## 2012-02-20 DIAGNOSIS — K219 Gastro-esophageal reflux disease without esophagitis: Secondary | ICD-10-CM | POA: Insufficient documentation

## 2012-02-20 DIAGNOSIS — I5032 Chronic diastolic (congestive) heart failure: Secondary | ICD-10-CM | POA: Diagnosis present

## 2012-02-20 DIAGNOSIS — K449 Diaphragmatic hernia without obstruction or gangrene: Secondary | ICD-10-CM | POA: Insufficient documentation

## 2012-02-20 DIAGNOSIS — R079 Chest pain, unspecified: Secondary | ICD-10-CM

## 2012-02-20 DIAGNOSIS — J189 Pneumonia, unspecified organism: Secondary | ICD-10-CM | POA: Diagnosis present

## 2012-02-20 DIAGNOSIS — Z950 Presence of cardiac pacemaker: Secondary | ICD-10-CM | POA: Diagnosis present

## 2012-02-20 DIAGNOSIS — E785 Hyperlipidemia, unspecified: Secondary | ICD-10-CM | POA: Insufficient documentation

## 2012-02-20 DIAGNOSIS — Z7901 Long term (current) use of anticoagulants: Secondary | ICD-10-CM | POA: Insufficient documentation

## 2012-02-20 NOTE — ED Notes (Signed)
Per EMS: Pt from home c/o intermittent left sided CP starting at 2100. Denied radiation. Pt reports nausea. Denies pain/weakness/diaphoresis. Pt dizzy while ambulating. AO x4. Pt A fib 80. Pt has pacemaker. 96% 4L. Pt on coumadin. Denies CP at this time.

## 2012-02-20 NOTE — ED Notes (Signed)
MD at bedside. 

## 2012-02-20 NOTE — ED Provider Notes (Addendum)
History     CSN: [id number]  Arrival date & time 02/20/12  2319   First MD Initiated Contact with Patient 02/20/12 2322      Chief Complaint  Patient presents with  . Chest Pain    (Consider location/radiation/quality/duration/timing/severity/associated sxs/prior treatment) Patient is a 79 y.o. female presenting with chest pain. The history is provided by the patient.  Chest Pain The chest pain began 3 - 5 hours ago. Chest pain occurs intermittently. The chest pain is resolved. At its most intense, the pain is at 7/10. The pain is currently at 0/10. The severity of the pain is moderate. The quality of the pain is described as sharp. The pain radiates to the left arm, left jaw and left neck. She tried aspirin for the symptoms. There are no known risk factors.  Procedure history is positive for cardiac catheterization.     Past Medical History  Diagnosis Date  . Paroxysmal atrial fibrillation     tachy-brady syndrome  . Hypertension   . Hyperlipidemia   . GERD (gastroesophageal reflux disease)   . Chest pain     with cath 2002 and 2004 showing mild non-obstructive CAD  . Osteoporosis   . Dyspnea     chronic; myoview 11/9. EF 81% probable soft-tissue attenuation in anterior and lat walls, can't exclude ischemia  . Obesity     Past Surgical History  Procedure Date  . Cardiac catheterization   . Breast lumpectomy     right  . Total abdominal hysterectomy w/ bilateral salpingoophorectomy 1974  . Colectomy 1991  . Pacemaker insertion     St Jude    Family History  Problem Relation Age of Onset  . Cancer Other   . Heart disease Other   . Hypertension Other     History  Substance Use Topics  . Smoking status: Never Smoker   . Smokeless tobacco: Not on file  . Alcohol Use: No    OB History    Grav Para Term Preterm Abortions TAB SAB Ect Mult Living                  Review of Systems  Cardiovascular: Positive for chest pain.  All other systems reviewed and  are negative.    Allergies  Morphine and related  Home Medications   Current Outpatient Rx  Name  Route  Sig  Dispense  Refill  . BISOPROLOL FUMARATE 10 MG PO TABS      TAKE 1 TABLET BY MOUTH TWICE DAILY.   60 tablet   12   . DILTIAZEM HCL ER COATED BEADS 120 MG PO CP24   Oral   Take 1 capsule (120 mg total) by mouth daily.   30 capsule   11   . OMEGA-3 FATTY ACIDS 1000 MG PO CAPS   Oral   Take 2 g by mouth daily as needed.          . FUROSEMIDE 20 MG PO TABS   Oral   Take 1 tablet (20 mg total) by mouth daily.   90 tablet   3   . ISOSORBIDE MONONITRATE ER 30 MG PO TB24   Oral   Take 1 tablet (30 mg total) by mouth daily.   90 tablet   3   . OMEPRAZOLE 20 MG PO CPDR   Oral   Take 20 mg by mouth daily.           [name] ROPINIROLE HCL 0.25 MG PO TABS   Oral  Take 1 tablet by mouth At bedtime.         . WARFARIN SODIUM 4 MG PO TABS   Oral   Take 4 mg by mouth. Use as directed by Anticoagulation Clinic (3 mg alternating with 4 mg daily)         . ZOCOR 20 MG PO TABS      TAKE 1 TABLET AT BEDTIME.   30 tablet   12     BP 107/71  Pulse 90  Temp 98.4 F (36.9 C) (Oral)  Resp 22  SpO2 94%  Physical Exam  Constitutional: She is oriented to person, place, and time. She appears well-developed and well-nourished.  HENT:  Head: Normocephalic and atraumatic.  Eyes: Conjunctivae and EOM are normal. Pupils are equal, round, and reactive to light.  Neck: Normal range of motion.  Cardiovascular: Normal rate and normal heart sounds.  An irregularly irregular rhythm present.  Pulmonary/Chest: Effort normal and breath sounds normal.  Abdominal: Soft. Bowel sounds are normal.  Musculoskeletal: Normal range of motion.  Neurological: She is alert and oriented to person, place, and time.  Skin: Skin is warm and dry.  Psychiatric: She has a normal mood and affect. Her behavior is normal.    ED Course  Procedures (including critical care time)   Labs  Reviewed  CBC  COMPREHENSIVE METABOLIC PANEL  PROTIME-INR  LIPASE, BLOOD   No results found.   No diagnosis found.   Date: 02/20/2012  Rate: 78  Rhythm: atrial fibrillation  QRS Axis: normal  Intervals: normal  ST/T Wave abnormalities: nonspecific ST changes  Conduction Disutrbances:none  Narrative Interpretation:   Old EKG Reviewed: unchanged    MDM  + chest pain.  Resolved.  Hx of afib,  Ppm.  Will lab,  reasses        Edras Wilford Lytle Michaels, MD 02/20/12 4098  Rosanne Ashing, MD 03/13/12 [phone]

## 2012-02-21 ENCOUNTER — Observation Stay (HOSPITAL_COMMUNITY): Payer: Medicare PPO

## 2012-02-21 ENCOUNTER — Encounter (HOSPITAL_COMMUNITY): Payer: Self-pay | Admitting: Internal Medicine

## 2012-02-21 DIAGNOSIS — J189 Pneumonia, unspecified organism: Secondary | ICD-10-CM

## 2012-02-21 DIAGNOSIS — I5032 Chronic diastolic (congestive) heart failure: Secondary | ICD-10-CM

## 2012-02-21 DIAGNOSIS — I4891 Unspecified atrial fibrillation: Secondary | ICD-10-CM

## 2012-02-21 DIAGNOSIS — Z950 Presence of cardiac pacemaker: Secondary | ICD-10-CM

## 2012-02-21 DIAGNOSIS — R079 Chest pain, unspecified: Secondary | ICD-10-CM

## 2012-02-21 DIAGNOSIS — R0602 Shortness of breath: Secondary | ICD-10-CM

## 2012-02-21 LAB — COMPREHENSIVE METABOLIC PANEL
ALT: 10 U/L (ref 0–35)
AST: 21 U/L (ref 0–37)
Alkaline Phosphatase: 89 U/L (ref 39–117)
CO2: 26 mEq/L (ref 19–32)
Calcium: 9.4 mg/dL (ref 8.4–10.5)
Potassium: 3.4 mEq/L — ABNORMAL LOW (ref 3.5–5.1)
Sodium: 137 mEq/L (ref 135–145)
Total Protein: 7 g/dL (ref 6.0–8.3)

## 2012-02-21 LAB — CBC
MCH: 32.3 pg (ref 26.0–34.0)
MCH: 32.6 pg (ref 26.0–34.0)
MCHC: 34.2 g/dL (ref 30.0–36.0)
MCHC: 34.6 g/dL (ref 30.0–36.0)
MCV: 94.2 fL (ref 78.0–100.0)
Platelets: 274 10*3/uL (ref 150–400)
Platelets: 275 10*3/uL (ref 150–400)
RBC: 4.34 MIL/uL (ref 3.87–5.11)
RBC: 4.51 MIL/uL (ref 3.87–5.11)
RDW: 13.5 % (ref 11.5–15.5)

## 2012-02-21 LAB — LIPASE, BLOOD: Lipase: 47 U/L (ref 11–59)

## 2012-02-21 LAB — BASIC METABOLIC PANEL
BUN: 21 mg/dL (ref 6–23)
CO2: 27 mEq/L (ref 19–32)
Calcium: 9.4 mg/dL (ref 8.4–10.5)
Creatinine, Ser: 1.05 mg/dL (ref 0.50–1.10)
GFR calc non Af Amer: 47 mL/min — ABNORMAL LOW (ref >90)
Glucose, Bld: 97 mg/dL (ref 70–99)
Sodium: 138 mEq/L (ref 135–145)

## 2012-02-21 LAB — TROPONIN I: Troponin I: <0.3 ng/mL (ref <0.30)

## 2012-02-21 LAB — D-DIMER, QUANTITATIVE: D-Dimer, Quant: 0.31 ug/mL-FEU (ref 0.00–0.48)

## 2012-02-21 LAB — POCT I-STAT TROPONIN I: Troponin i, poc: 0 ng/mL (ref 0.00–0.08)

## 2012-02-21 MED ORDER — WARFARIN SODIUM 3 MG PO TABS
3.0000 mg | ORAL_TABLET | ORAL | Status: DC
  Administered 2012-02-21: 3 mg via ORAL
  Filled 2012-02-21: qty 1

## 2012-02-21 MED ORDER — ONDANSETRON HCL 4 MG PO TABS: 4.0000 mg | ORAL_TABLET | Freq: Four times a day (QID) | ORAL | Status: DC | PRN

## 2012-02-21 MED ORDER — ASPIRIN EC 81 MG PO TBEC
81.0000 mg | DELAYED_RELEASE_TABLET | Freq: Every day | ORAL | Status: DC
  Administered 2012-02-21 – 2012-02-22 (×2): 81 mg via ORAL
  Filled 2012-02-21 (×2): qty 1

## 2012-02-21 MED ORDER — ACETAMINOPHEN 325 MG PO TABS: 650.0000 mg | ORAL_TABLET | Freq: Four times a day (QID) | ORAL | Status: DC | PRN

## 2012-02-21 MED ORDER — LEVOFLOXACIN IN D5W 750 MG/150ML IV SOLN
750.0000 mg | INTRAVENOUS | Status: DC
  Administered 2012-02-21: 750 mg via INTRAVENOUS
  Filled 2012-02-21: qty 150

## 2012-02-21 MED ORDER — ISOSORBIDE MONONITRATE ER 30 MG PO TB24
30.0000 mg | ORAL_TABLET | Freq: Every day | ORAL | Status: DC
  Administered 2012-02-21 – 2012-02-22 (×2): 30 mg via ORAL
  Filled 2012-02-21 (×2): qty 1

## 2012-02-21 MED ORDER — ROPINIROLE HCL 0.5 MG PO TABS
0.5000 mg | ORAL_TABLET | Freq: Every day | ORAL | Status: DC
  Administered 2012-02-21: 0.5 mg via ORAL
  Filled 2012-02-21 (×2): qty 1

## 2012-02-21 MED ORDER — ACETAMINOPHEN 650 MG RE SUPP: 650.0000 mg | Freq: Four times a day (QID) | RECTAL | Status: DC | PRN

## 2012-02-21 MED ORDER — SIMVASTATIN 20 MG PO TABS
20.0000 mg | ORAL_TABLET | Freq: Every evening | ORAL | Status: DC
  Filled 2012-02-21: qty 1

## 2012-02-21 MED ORDER — SODIUM CHLORIDE 0.9 % IJ SOLN: 3.0000 mL | Freq: Two times a day (BID) | INTRAMUSCULAR | Status: DC

## 2012-02-21 MED ORDER — WARFARIN - PHARMACIST DOSING INPATIENT: Freq: Every day | Status: DC

## 2012-02-21 MED ORDER — POTASSIUM CHLORIDE CRYS ER 20 MEQ PO TBCR
20.0000 meq | EXTENDED_RELEASE_TABLET | Freq: Once | ORAL | Status: AC
  Administered 2012-02-21: 20 meq via ORAL
  Filled 2012-02-21: qty 1

## 2012-02-21 MED ORDER — PANTOPRAZOLE SODIUM 40 MG PO TBEC
40.0000 mg | DELAYED_RELEASE_TABLET | Freq: Every day | ORAL | Status: DC
  Administered 2012-02-21: 40 mg via ORAL
  Filled 2012-02-21: qty 1

## 2012-02-21 MED ORDER — WARFARIN SODIUM 4 MG PO TABS
4.0000 mg | ORAL_TABLET | ORAL | Status: DC
  Filled 2012-02-21: qty 1

## 2012-02-21 MED ORDER — ONDANSETRON HCL 4 MG/2ML IJ SOLN: 4.0000 mg | Freq: Four times a day (QID) | INTRAMUSCULAR | Status: DC | PRN

## 2012-02-21 MED ORDER — ATORVASTATIN CALCIUM 10 MG PO TABS
10.0000 mg | ORAL_TABLET | Freq: Every day | ORAL | Status: DC
  Administered 2012-02-21 – 2012-02-22 (×2): 10 mg via ORAL
  Filled 2012-02-21 (×2): qty 1

## 2012-02-21 MED ORDER — HYDROCHLOROTHIAZIDE 12.5 MG PO CAPS
12.5000 mg | ORAL_CAPSULE | Freq: Every day | ORAL | Status: DC
  Administered 2012-02-22: 12.5 mg via ORAL
  Filled 2012-02-21 (×2): qty 1

## 2012-02-21 MED ORDER — BISOPROLOL FUMARATE 10 MG PO TABS
10.0000 mg | ORAL_TABLET | Freq: Two times a day (BID) | ORAL | Status: DC
  Administered 2012-02-21 – 2012-02-22 (×2): 10 mg via ORAL
  Filled 2012-02-21 (×5): qty 1

## 2012-02-21 MED ORDER — SODIUM CHLORIDE 0.9 % IJ SOLN
3.0000 mL | Freq: Two times a day (BID) | INTRAMUSCULAR | Status: DC
  Administered 2012-02-21 (×2): 3 mL via INTRAVENOUS

## 2012-02-21 MED ORDER — DILTIAZEM HCL ER COATED BEADS 120 MG PO CP24
120.0000 mg | ORAL_CAPSULE | Freq: Every day | ORAL | Status: DC
  Administered 2012-02-21 – 2012-02-22 (×2): 120 mg via ORAL
  Filled 2012-02-21 (×2): qty 1

## 2012-02-21 NOTE — Progress Notes (Signed)
79yo female c/o intermittent CP that is now resolved, CXR reveals possible atelectasis, to begin IV ABX.  Will begin Levaquin 750mg  IV Q48H and monitor CBC and Cx.  02/21/2012 3:06 AM

## 2012-02-21 NOTE — Evaluation (Signed)
Physical Therapy Evaluation Patient Details Name: [name] MRN: [id number] DOB: Feb 07, 1923 Today's Date: 02/21/2012 Time: 7829-5621 PT Time Calculation (min): 31 min  PT Assessment / Plan / Recommendation Clinical Impression  pt admitted with atypical chest pain that has been determined to be PNA.  Pt is still mildly weak and unsteady on her feet, but should return toward baseline quickly.  No followup PT needed at this time.    PT Assessment  Patent does not need any further PT services    Follow Up Recommendations  No PT follow up    Does the patient have the potential to tolerate intense rehabilitation      Barriers to Discharge        Equipment Recommendations  None recommended by PT    Recommendations for Other Services     Frequency      Precautions / Restrictions Precautions Precautions: Fall   Pertinent Vitals/Pain       Mobility  Bed Mobility Bed Mobility: Supine to Sit;Sitting - Scoot to Edge of Bed Supine to Sit: 7: Independent Sitting - Scoot to [place] of Bed: 7: Independent Transfers Transfers: Sit to Stand;Stand to Sit Sit to Stand: 6: Modified independent (Device/Increase time) Stand to Sit: 6: Modified independent (Device/Increase time) Ambulation/Gait Ambulation/Gait Assistance: 5: Supervision Ambulation Distance (Feet): 190 Feet Assistive device: None Ambulation/Gait Assistance Details: mildly unsteady, occaisional use of the rail, but generally safe Gait Pattern: Within Functional Limits Stairs: No    Shoulder Instructions     Exercises     PT Diagnosis:    PT Problem List:   PT Treatment Interventions:     PT Goals    Visit Information  Last PT Received On: 02/21/12 Assistance Needed: +1    Subjective Data  Subjective: I get SOB alot, but this was a pain in my chest and down under my breast Patient Stated Goal: Independent   Prior Functioning  Home Living Lives With: Spouse;Other (Comment) (great granddaughter) Available  Help at Discharge: Family Type of Home: Mobile home Home Access: Stairs to enter Entrance Stairs-Number of Steps: 1/5 Home Layout: One level Bathroom Shower/Tub: Walk-in shower;Tub only Bathroom Toilet: Handicapped height Home Adaptive Equipment: Grab bars in shower Prior Function Level of Independence: Independent Able to Take Stairs?: Yes Driving: Yes 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) Communication Communication: No difficulties    Cognition  Overall Cognitive Status: Appears within functional limits for tasks assessed/performed Arousal/Alertness: Awake/alert Orientation Level: Appears intact for tasks assessed Behavior During Session: [facility] for tasks performed    Extremity/Trunk Assessment Right Lower Extremity Assessment RLE ROM/Strength/Tone: [facility] for tasks assessed Left Lower Extremity Assessment LLE ROM/Strength/Tone: [facility] for tasks assessed (very mild general weakness bil >=4/5) Trunk Assessment Trunk Assessment: Kyphotic   Balance    End of Session PT - End of Session Activity Tolerance: Patient tolerated treatment well Patient left: Other (comment);with call bell/phone within reach (sitting EOB) Nurse Communication: Mobility status  GP Functional Assessment Tool Used: clinical judgement Functional Limitation: Mobility: Walking and moving around Mobility: Walking and Moving Around Current Status (H0865): At least 1 percent but less than 20 percent impaired, limited or restricted Mobility: Walking and Moving Around Goal Status [phone]): At least 1 percent but less than 20 percent impaired, limited or restricted Mobility: Walking and Moving Around Discharge Status [phone]): At least 1 percent but less than 20 percent impaired, limited or restricted   Kinnick Maus, Eliseo Gum 02/21/2012, 4:51 PM  02/21/2012  Bradford Bing, PT [phone] (pager)

## 2012-02-21 NOTE — Progress Notes (Signed)
Patient admitted after midnight secondary to cough, SOB and left side chest discomfort. Per evaluation and CXR reports appears to be secondary to PNA. None the less she had strong hx of CAD.  Plan: -Will cycle cardiac enzymes -continue antibiotics -start IS -Follow PA/Lat CXR in the morning -PT/OT evaluation -BP a little soft this morning; will hold diuretics and zebata this morning. -rest of medical problems remains stable and the plan is to continue current management, please refer to H7P by Dr. Toniann Fail for further details on admission and plan of care.  [name] [phone]

## 2012-02-21 NOTE — H&P (Signed)
[name] is an 79 y.o. female.   Patient was seen and examined on February 21, 2012. PCP - Dr. Ty Hilts in [place]. Cardiologist - [facility]. Chief Complaint: Chest pain. HPI: 79 year old female with history of paroxysmal atrial fibrillation on Coumadin, hypertension, hyperlipidemia started experiencing chest pain last evening. The pain was more on the left side of her shoulder going to the left lateral aspect of the chest have been multiple times and was like shocklike experience. Denies any productive cough fever chills and has happened multiple times. In the ER chest x-ray was showing air space disease with fluid on the left side. EKG were showing A. fib with nonspecific ST changes and troponins are negative. Patient states he has had a cardiac catheter in December 2012 which has to the patient was negative for any acute. Patient at this time has been admitted for further management. Patient presently chest pain-free.  Past Medical History  Diagnosis Date  . Paroxysmal atrial fibrillation     tachy-brady syndrome  . Hypertension   . Hyperlipidemia   . GERD (gastroesophageal reflux disease)   . Chest pain     with cath 2002 and 2004 showing mild non-obstructive CAD  . Osteoporosis   . Dyspnea     chronic; myoview 11/9. EF 81% probable soft-tissue attenuation in anterior and lat walls, can't exclude ischemia  . Obesity     Past Surgical History  Procedure Date  . Cardiac catheterization   . Breast lumpectomy     right  . Total abdominal hysterectomy w/ bilateral salpingoophorectomy 1974  . Colectomy 1991  . Pacemaker insertion     St Jude    Family History  Problem Relation Age of Onset  . Cancer Other   . Heart disease Other   . Hypertension Other    Social History:  reports that she has never smoked. She does not have any smokeless tobacco history on file. She reports that she does not drink alcohol or use illicit drugs.  Allergies:  Allergies  Allergen  Reactions  . Morphine And Related Other (See Comments)    hallucinations    Medications Prior to Admission  Medication Sig Dispense Refill  . bisoprolol (ZEBETA) 10 MG tablet Take 10 mg by mouth 2 (two) times daily.      [name] diltiazem (CARDIZEM CD) 120 MG 24 hr capsule Take 1 capsule (120 mg total) by mouth daily.  30 capsule  11  . hydrochlorothiazide (MICROZIDE) 12.5 MG capsule Take 12.5 mg by mouth daily.      . isosorbide mononitrate (IMDUR) 30 MG 24 hr tablet Take 30 mg by mouth daily.      [name] omeprazole (PRILOSEC) 20 MG capsule Take 20 mg by mouth daily.        [name] rOPINIRole (REQUIP) 0.5 MG tablet Take 0.5 mg by mouth at bedtime.      . simvastatin (ZOCOR) 20 MG tablet Take 20 mg by mouth every evening.      . warfarin (COUMADIN) 3 MG tablet Take 3 mg by mouth daily. On Sunday, Tuesday, Thursday and Saturdays      . warfarin (COUMADIN) 4 MG tablet Take 4 mg by mouth every Monday, Wednesday, and Friday.        Results for orders placed during the hospital encounter of 02/20/12 (from the past 48 hour(s))  CBC     Status: Abnormal   Collection Time   02/20/12 11:49 PM      Component Value Range Comment  WBC 11.2 (*) 4.0 - 10.5 K/uL    RBC 4.51  3.87 - 5.11 MIL/uL    Hemoglobin 14.7  12.0 - 15.0 g/dL    HCT 40.9  81.1 - 91.4 %    MCV 94.2  78.0 - 100.0 fL    MCH 32.6  26.0 - 34.0 pg    MCHC 34.6  30.0 - 36.0 g/dL    RDW 78.2  95.6 - 21.3 %    Platelets 275  150 - 400 K/uL   COMPREHENSIVE METABOLIC PANEL     Status: Abnormal   Collection Time   02/20/12 11:49 PM      Component Value Range Comment   Sodium 137  135 - 145 mEq/L    Potassium 3.4 (*) 3.5 - 5.1 mEq/L    Chloride 99  96 - 112 mEq/L    CO2 26  19 - 32 mEq/L    Glucose, Bld 109 (*) 70 - 99 mg/dL    BUN 24 (*) 6 - 23 mg/dL    Creatinine, Ser 0.86 (*) 0.50 - 1.10 mg/dL    Calcium 9.4  8.4 - 57.8 mg/dL    Total Protein 7.0  6.0 - 8.3 g/dL    Albumin 3.5  3.5 - 5.2 g/dL    AST 21  0 - 37 U/L    ALT 10  0 - 35 U/L     Alkaline Phosphatase 89  39 - 117 U/L    Total Bilirubin 0.4  0.3 - 1.2 mg/dL    GFR calc non Af Amer 43 (*) >90 mL/min    GFR calc Af Amer 50 (*) >90 mL/min   PROTIME-INR     Status: Abnormal   Collection Time   02/20/12 11:49 PM      Component Value Range Comment   Prothrombin Time 23.0 (*) 11.6 - 15.2 seconds    INR 2.14 (*) 0.00 - 1.49   LIPASE, BLOOD     Status: Normal   Collection Time   02/20/12 11:49 PM      Component Value Range Comment   Lipase 47  11 - 59 U/L   POCT I-STAT TROPONIN I     Status: Normal   Collection Time   02/21/12 12:26 AM      Component Value Range Comment   Troponin i, poc 0.00  0.00 - 0.08 ng/mL    Comment 3             Dg Chest Port 1 View  02/21/2012  *RADIOLOGY REPORT*  Clinical Data: New onset of left-sided chest pain.  PORTABLE CHEST - 1 VIEW  Comparison: Chest radiograph performed 08/05/2010  Findings: The lungs are relatively well-aerated.  Mild left basilar opacity likely reflects atelectasis, with question of a small left pleural effusion.  There is no evidence of pneumothorax.  The cardiomediastinal silhouette is enlarged; a large hiatal hernia is again seen.  A pacemaker is noted overlying the left chest wall, with leads ending overlying the right atrium and right ventricle. No acute osseous abnormalities are seen.  IMPRESSION:  1.  Mild left basilar airspace opacity likely reflects atelectasis, with question of a small left pleural effusion. 2.  Cardiomegaly. 3.  Large hiatal hernia again seen.   Original Report Authenticated By: Tonia Ghent, M.D.     Review of Systems  Constitutional: Negative.   HENT: Negative.   Eyes: Negative.   Respiratory: Positive for cough and sputum production.   Cardiovascular: Positive for chest pain.  Gastrointestinal: Negative.   Genitourinary: Negative.   Musculoskeletal: Negative.   Skin: Negative.   Neurological: Negative.   Endo/Heme/Allergies: Negative.   Psychiatric/Behavioral: Negative.     Blood  pressure 113/70, pulse 79, temperature 97.7 F (36.5 C), temperature source Oral, resp. rate 18, height 5\' 6"  (1.676 m), weight 88.134 kg (194 lb 4.8 oz), SpO2 93.00%. Physical Exam  Constitutional: She is oriented to person, place, and time. She appears well-developed and well-nourished. No distress.  HENT:  Head: Normocephalic and atraumatic.  Right Ear: External ear normal.  Left Ear: External ear normal.  Nose: Nose normal.  Mouth/Throat: Oropharynx is clear and moist. No oropharyngeal exudate.  Eyes: Conjunctivae normal are normal. Pupils are equal, round, and reactive to light. Right eye exhibits no discharge. Left eye exhibits no discharge. No scleral icterus.  Neck: Normal range of motion. Neck supple.  Cardiovascular: Normal rate.   Respiratory: Effort normal and breath sounds normal. No respiratory distress. She has no wheezes. She has no rales.  GI: Soft. Bowel sounds are normal. She exhibits no distension. There is no tenderness. There is no rebound.  Musculoskeletal: She exhibits no edema and no tenderness.  Neurological: She is alert and oriented to person, place, and time.  Skin: Skin is warm and dry. She is not diaphoretic.     Assessment/Plan #1. Atypical chest pain - we will cycle cardiac markers. Aspirin. Since there is airspace disease seen in the chest x-ray at this time I have placed patient empirically on antibiotics to cover for pneumonia repeat chest x-ray in a.m. to see if airspace disease persists. Check d-dimer. #2. Atrial fibrillation rate controlled - continue present medications And Coumadin per pharmacy. #3. Hypertension - continue present medications. #4. GERD - continue PPI.  CODE STATUS -  Full code.  Arelia Volpe N. 02/21/2012, 3:26 AM

## 2012-02-21 NOTE — Progress Notes (Signed)
ANTICOAGULATION CONSULT NOTE - Initial Consult  Pharmacy Consult for Coumadin Indication: atrial fibrillation  Allergies  Allergen Reactions  . Morphine And Related Other (See Comments)    hallucinations    Patient Measurements: Height: 5\' 6"  (167.6 cm) Weight: 194 lb 4.8 oz (88.134 kg) IBW/kg (Calculated) : 59.3   Vital Signs: Temp: 97.7 F (36.5 C) (01/21 0233) Temp src: Oral (01/21 0233) BP: 113/70 mmHg (01/21 0233) Pulse Rate: 79  (01/21 0233)  Labs:  Basename 02/20/12 2349  HGB 14.7  HCT 42.5  PLT 275  APTT --  LABPROT 23.0*  INR 2.14*  HEPARINUNFRC --  CREATININE 1.12*  CKTOTAL --  CKMB --  TROPONINI --    Estimated Creatinine Clearance: 40.3 ml/min (by C-G formula based on Cr of 1.12).   Medical History: Past Medical History  Diagnosis Date  . Paroxysmal atrial fibrillation     tachy-brady syndrome  . Hypertension   . Hyperlipidemia   . GERD (gastroesophageal reflux disease)   . Chest pain     with cath 2002 and 2004 showing mild non-obstructive CAD  . Osteoporosis   . Dyspnea     chronic; myoview 11/9. EF 81% probable soft-tissue attenuation in anterior and lat walls, can't exclude ischemia  . Obesity     Medications:  Prescriptions prior to admission  Medication Sig Dispense Refill  . bisoprolol (ZEBETA) 10 MG tablet Take 10 mg by mouth 2 (two) times daily.      [name] diltiazem (CARDIZEM CD) 120 MG 24 hr capsule Take 1 capsule (120 mg total) by mouth daily.  30 capsule  11  . hydrochlorothiazide (MICROZIDE) 12.5 MG capsule Take 12.5 mg by mouth daily.      . isosorbide mononitrate (IMDUR) 30 MG 24 hr tablet Take 30 mg by mouth daily.      [name] omeprazole (PRILOSEC) 20 MG capsule Take 20 mg by mouth daily.        [name] rOPINIRole (REQUIP) 0.5 MG tablet Take 0.5 mg by mouth at bedtime.      . simvastatin (ZOCOR) 20 MG tablet Take 20 mg by mouth every evening.      . warfarin (COUMADIN) 3 MG tablet Take 3 mg by mouth daily. On Sunday, Tuesday, Thursday  and Saturdays      . warfarin (COUMADIN) 4 MG tablet Take 4 mg by mouth every Monday, Wednesday, and Friday.       Scheduled:    . aspirin EC  81 mg Oral Daily  . bisoprolol  10 mg Oral BID  . diltiazem  120 mg Oral Daily  . hydrochlorothiazide  12.5 mg Oral Daily  . isosorbide mononitrate  30 mg Oral Daily  . levofloxacin (LEVAQUIN) IV  750 mg Intravenous Q48H  . pantoprazole  40 mg Oral Daily  . potassium chloride  20 mEq Oral Once  . rOPINIRole  0.5 mg Oral QHS  . simvastatin  20 mg Oral QPM  . sodium chloride  3 mL Intravenous Q12H  . sodium chloride  3 mL Intravenous Q12H  . warfarin  3 mg Oral Q T,Th,S,Su-1800  . warfarin  4 mg Oral Q M,W,F-1800  . Warfarin - Pharmacist Dosing Inpatient   Does not apply q1800    Assessment: 79yo female c/o intermittent CP that is now resolved, to continue Coumadin for Afib; admitted with therapeutic INR with no doses missed per pt.  Goal of Therapy:  INR 2-3  Plan:  Will continue home Coumadin dose of 4mg  MWF and 3mg   TTSS and monitor INR.  Colleen Can PharmD BCPS 02/21/2012,3:34 AM

## 2012-02-21 NOTE — Progress Notes (Signed)
Utilization review completed.  

## 2012-02-21 NOTE — Progress Notes (Signed)
Spoke with Dr. Gwenlyn Perking regarding patient soft BP. Ok to give cardizem and Imdur this morning. Hold microzide and zebata. Will continue to monitor and watch patient closely. Levonne Spiller, RN

## 2012-02-22 LAB — PROTIME-INR: Prothrombin Time: 21.7 seconds — ABNORMAL HIGH (ref 11.6–15.2)

## 2012-02-22 MED ORDER — ATORVASTATIN CALCIUM 10 MG PO TABS: 10.0000 mg | ORAL_TABLET | Freq: Every day | ORAL | Status: DC

## 2012-02-22 MED ORDER — LEVOFLOXACIN 750 MG PO TABS: 750.0000 mg | ORAL_TABLET | ORAL | Status: DC

## 2012-02-22 MED ORDER — LEVOFLOXACIN 750 MG PO TABS
750.0000 mg | ORAL_TABLET | ORAL | Status: DC
  Filled 2012-02-22: qty 1

## 2012-02-22 NOTE — Progress Notes (Signed)
ANTICOAGULATION CONSULT NOTE - Follow-up  Pharmacy Consult for Coumadin Indication: atrial fibrillation  Allergies  Allergen Reactions  . Morphine And Related Other (See Comments)    hallucinations    Patient Measurements: Height: 5\' 6"  (167.6 cm) Weight: 192 lb 11.2 oz (87.408 kg) IBW/kg (Calculated) : 59.3   Vital Signs: Temp: 97.9 F (36.6 C) (01/22 0552) Temp src: Oral (01/22 0552) BP: 121/76 mmHg (01/22 0552) Pulse Rate: 63  (01/22 0552)  Labs:  Basename 02/22/12 0627 02/21/12 1549 02/21/12 0900 02/21/12 0440 02/20/12 2349  HGB -- -- -- 14.0 14.7  HCT -- -- -- 40.9 42.5  PLT -- -- -- 274 275  APTT -- -- -- -- --  LABPROT 21.7* -- -- -- 23.0*  INR 1.98* -- -- -- 2.14*  HEPARINUNFRC -- -- -- -- --  CREATININE -- -- -- 1.05 1.12*  CKTOTAL -- -- -- -- --  CKMB -- -- -- -- --  TROPONINI -- <0.30 <0.30 <0.30 --    Estimated Creatinine Clearance: 42.8 ml/min (by C-G formula based on Cr of 1.05).  Assessment: 79yo female c/o intermittent CP that is now resolved, to continue Coumadin for Afib. INR today is very slightly subtherapeutic at 1.98. No CBC today, no bleeding noted.   Goal of Therapy:  INR 2-3  Plan:  1. Will continue home regimen for now - 4mg  tonight (if INR continues to fall, will need to adjust regimen) 2. F/u AM INR  Lysle Pearl, PharmD, BCPS Pager # [phone] 02/22/2012 8:35 AM

## 2012-02-22 NOTE — Discharge Summary (Signed)
Physician Discharge Summary  [name]:[id number] DOB: 1923-04-10 DOA: 02/20/2012  PCP: Marilyn Rakes, MD Electrophysiologist: [name], M.D. Primary Cardiologist: Dr. Arvilla [name]  Admit date: 02/20/2012 Discharge date: 02/22/2012  Recommendations for Outpatient Follow-up:  1. Followup resolution of pneumonia 2. Closely follow PT/INR while on antibiotics  Follow-up Information    Follow up with HODGES,FRANCISCO, MD. In 2 days. (for PT/INR check)         Discharge Diagnoses:  1. Atypical chest pain 2. Suspected community acquired pneumonia 3. Hiatal hernia/GERD 4. Paroxysmal atrial fibrillation 5. Hypertension  Discharge Condition: Improved Disposition: Home  Diet recommendation: Heart healthy  Filed Weights   02/21/12 0233 02/21/12 0625 02/22/12 0552  Weight: 88.134 kg (194 lb 4.8 oz) 88.134 kg (194 lb 4.8 oz) 87.408 kg (192 lb 11.2 oz)    History of present illness:  79 year old female with history of paroxysmal atrial fibrillation on Coumadin, hypertension, hyperlipidemia started experiencing chest pain last evening. The pain was more on the left side of her shoulder going to the left lateral aspect of the chest have been multiple times and was like shocklike experience. In the ER chest x-ray was showing air space disease with fluid on the left side. EKG were showing A. fib with nonspecific ST changes and troponins are negative.   Hospital Course:  [name] was admitted for further treatment of suspected pneumonia and evaluation of atypical chest pain. Atypical chest pain resolved, cardiac enzymes were negative and history suggests etiology as community acquired pneumonia. Rapidly improved with antibiotics, will complete antibiotics as an outpatient.  1. Atypical chest pain: Resolved. History suggests atypical etiology and location of pain correlates with suspected left-sided pneumonia. Also has a history of hiatal hernia induced chest discomfort. Cardiac  enzymes negative. D-dimer negative. History of cardiac catheterization 2002, 2004 demonstrating nonobstructive coronary artery disease per review of cardiology office notes. Cardiac catheterization 2012 again showed nonobstructive coronary artery disease with normal left ventricular function. Medical therapy was recommended at that time.  2. Suspected community acquired pneumonia: Location correlates with pain. Pain resolved with treatment. Repeat imaging unremarkable, however history suggests left-sided pneumonia as suspected on earlier x-ray. Complete empiric antibiotic therapy.  3. Hiatal hernia/GERD: Stable.  4. Paroxysmal atrial fibrillation: Continue warfarin, close outpatient followup while on antibiotics. History of tachybradycardia syndrome.  5. Hypertension: Avoid Zocor discharge as patient is on diltiazem.  Chronic dyspnea noted office visit 07/01/2010. Patient reports dyspnea is at baseline. Given lack of further chest pain, atypical symptoms, negative enzymes and suspected pneumonia, no further cardiac evaluation suggested.  Consultants:  Physical therapy: No physical therapy followup.  Procedures:  None  Antibiotics:  Levaquin 1/21 >> 1/27  Discharge Instructions  Discharge Orders    Future Appointments: Provider: Department: Dept Phone: Center:   05/14/2012 11:55 AM [facility] Main Office [place]) [phone] [id number]     Future Orders Please Complete By Expires   Diet - low sodium heart healthy      Increase activity slowly      Discharge instructions      Comments:   Be sure to complete antibiotics for pneumonia (Levaquin). Antibiotics can affect PT/INR. It is therefore very important to followup with your primary care physician for repeat PT/INR in the next 48 hours. According to the FDA, Zocor can sometimes cause a reaction when taken with diltiazem. Therefore it is recommended you discontinue Zocor and substitute Lipitor. Call  physician or seek immediate medical attention for fever, increased shortness of breath, chest  pain or worsening of condition.       Medication List     As of 02/22/2012  1:36 PM    STOP taking these medications         simvastatin 20 MG tablet   Commonly known as: ZOCOR      TAKE these medications         atorvastatin 10 MG tablet   Commonly known as: LIPITOR   Take 1 tablet (10 mg total) by mouth daily at 6 PM.      bisoprolol 10 MG tablet   Commonly known as: ZEBETA   Take 10 mg by mouth 2 (two) times daily.      diltiazem 120 MG 24 hr capsule   Commonly known as: CARDIZEM CD   Take 1 capsule (120 mg total) by mouth daily.      hydrochlorothiazide 12.5 MG capsule   Commonly known as: MICROZIDE   Take 12.5 mg by mouth daily.      isosorbide mononitrate 30 MG 24 hr tablet   Commonly known as: IMDUR   Take 30 mg by mouth daily.      levofloxacin 750 MG tablet   Commonly known as: LEVAQUIN   Take 1 tablet (750 mg total) by mouth every other day. First dose 1/23.      omeprazole 20 MG capsule   Commonly known as: PRILOSEC   Take 20 mg by mouth daily.      rOPINIRole 0.5 MG tablet   Commonly known as: REQUIP   Take 0.5 mg by mouth at bedtime.      warfarin 4 MG tablet   Commonly known as: COUMADIN   Take 4 mg by mouth every Monday, Wednesday, and Friday.      warfarin 3 MG tablet   Commonly known as: COUMADIN   Take 3 mg by mouth daily. On Sunday, Tuesday, Thursday and Saturdays        The results of significant diagnostics from this hospitalization (including imaging, microbiology, ancillary and laboratory) are listed below for reference.    Significant Diagnostic Studies: Dg Chest 2 View  02/21/2012  *RADIOLOGY REPORT*  Clinical Data: Shortness of breath, chest heaviness, atrial fibrillation  CHEST - 2 VIEW  Comparison: Portable chest x-ray of 02/21/2012  Findings: The lungs are clear but hyperaerated.  The heart is mildly enlarged.  There is a large hiatal  hernia noted which appears unchanged.  A dual lead permanent pacemaker is present. The bones are osteopenic with minimal compression of lower thoracic and upper lumbar vertebral bodies.  IMPRESSION:  1.  COPD.  No active lung disease. 2.  Large hiatal hernia is stable. 3.  Cardiomegaly with permanent pacemaker.   Original Report Authenticated By: Dwyane Dee, M.D.    Dg Chest Port 1 View  02/21/2012  *RADIOLOGY REPORT*  Clinical Data: Follow up left basilar opacity question atelectasis, exclude infiltrate  PORTABLE CHEST - 1 VIEW  Comparison: Portable exam 0624 hours compared to 02/20/2012  Findings: Left subclavian sequential transvenous pacemaker leads project at right atrium and right ventricle. Enlargement of cardiac silhouette. Atherosclerotic calcification aorta. Mediastinal contours and pulmonary vascularity otherwise normal. Skin fold projects over the right lung. Left lower lobe opacification and minimal right basilar atelectasis present. Upper lungs clear. No pleural effusion or pneumothorax. Bones demineralized.  IMPRESSION: New mild right basilar atelectasis. Persistent left lower lobe opacity question atelectasis versus infiltrate.   Original Report Authenticated By: Ulyses Southward, M.D.    Dg Chest [facility]  02/21/2012  *RADIOLOGY REPORT*  Clinical Data: New onset of left-sided chest pain.  PORTABLE CHEST - 1 VIEW  Comparison: Chest radiograph performed 08/05/2010  Findings: The lungs are relatively well-aerated.  Mild left basilar opacity likely reflects atelectasis, with question of a small left pleural effusion.  There is no evidence of pneumothorax.  The cardiomediastinal silhouette is enlarged; a large hiatal hernia is again seen.  A pacemaker is noted overlying the left chest wall, with leads ending overlying the right atrium and right ventricle. No acute osseous abnormalities are seen.  IMPRESSION:  1.  Mild left basilar airspace opacity likely reflects atelectasis, with question of a small  left pleural effusion. 2.  Cardiomegaly. 3.  Large hiatal hernia again seen.   Original Report Authenticated By: Tonia Ghent, M.D.       Labs: Basic Metabolic Panel:  Lab 02/21/12 1610 02/20/12 2349  NA 138 137  K 4.1 3.4*  CL 101 99  CO2 27 26  GLUCOSE 97 109*  BUN 21 24*  CREATININE 1.05 1.12*  CALCIUM 9.4 9.4  MG -- --  PHOS -- --   Liver Function Tests:  Lab 02/20/12 2349  AST 21  ALT 10  ALKPHOS 89  BILITOT 0.4  PROT 7.0  ALBUMIN 3.5    Lab 02/20/12 2349  LIPASE 47  AMYLASE --   CBC:  Lab 02/21/12 0440 02/20/12 2349  WBC 10.3 11.2*  NEUTROABS -- --  HGB 14.0 14.7  HCT 40.9 42.5  MCV 94.2 94.2  PLT 274 275   Cardiac Enzymes:  Lab 02/21/12 1549 02/21/12 0900 02/21/12 0440  CKTOTAL -- -- --  CKMB -- -- --  CKMBINDEX -- -- --  TROPONINI <0.30 <0.30 <0.30    Principal Problem:  *Chest pain Active Problems:  Atrial fibrillation  PACEMAKER, PERMANENT  Chronic diastolic heart failure  PNA (pneumonia)   Time coordinating discharge: 30 minutes  Signed:  Brendia Sacks, MD [facility] 02/22/2012, 1:36 PM

## 2012-02-22 NOTE — Progress Notes (Signed)
[facility] PROGRESS NOTE  [name] UJW:[id number] DOB: April 21, 1923 DOA: 02/20/2012 PCP: Charlott Rakes, MD Electrophysiologist: Sherryl Manges, M.D. Primary Cardiologist: Dr. Arvilla Meres  Assessment/Plan: 1. Atypical chest pain: Resolved. History suggests atypical etiology and location of pain correlates with suspected left-sided pneumonia. Also has a history of hiatal hernia induced chest discomfort. Cardiac enzymes negative. D-dimer negative. History of cardiac catheterization 2002, 2004 demonstrating nonobstructive coronary artery disease per review of cardiology office notes. Cardiac catheterization 2012 again showed nonobstructive coronary artery disease with normal left ventricular function. Medical therapy was recommended at that time. 2. Suspected community acquired pneumonia: Location correlates with pain. Pain resolved with treatment. Repeat imaging unremarkable, however history suggests left-sided pneumonia as suspected on earlier x-ray. Complete empiric antibiotic therapy. 3. Hiatal hernia/GERD: Stable. 4. Paroxysmal atrial fibrillation: Continue warfarin, close outpatient followup while on antibiotics. History of tachybradycardia syndrome. 5. Hypertension: Avoid Zocor discharge.  Chronic dyspnea noted office visit 07/01/2010. Patient reports dyspnea is at baseline. Given lack of further chest pain, atypical symptoms, negative enzymes and suspected pneumonia, no further cardiac evaluation suggested.  Code Status: Full code Family Communication: None present Disposition Plan: Home  Brendia Sacks, MD  [facility] Team 5 Pager [phone] If 7PM-7AM, please contact night-coverage at [email or web address], password [facility] 02/22/2012, 12:48 PM  LOS: 2 days   Brief narrative: 79 year old female with history of paroxysmal atrial fibrillation on Coumadin, hypertension, hyperlipidemia started experiencing chest pain last evening. The pain was more on the left side of her  shoulder going to the left lateral aspect of the chest have been multiple times and was like shocklike experience. In the ER chest x-ray was showing air space disease with fluid on the left side. EKG were showing A. fib with nonspecific ST changes and troponins are negative.   Consultants:  Physical therapy: No physical therapy followup.  Procedures:  None  Antibiotics:  Levaquin 1/21 >> 1/27  HPI/Subjective: Afebrile, vital signs stable. Minimal cough. No chest pain. Breathing better. Feels rate go home.  Objective: Filed Vitals:   02/21/12 1423 02/21/12 2057 02/22/12 0552 02/22/12 0919  BP: 105/70 107/79 121/76 117/73  Pulse: 82 89 63 68  Temp: 98.4 F (36.9 C) 98.1 F (36.7 C) 97.9 F (36.6 C)   TempSrc: Oral Oral Oral   Resp: 20 18 18    Height:      Weight:   87.408 kg (192 lb 11.2 oz)   SpO2: 92% 94% 92%     Intake/Output Summary (Last 24 hours) at 02/22/12 1248 Last data filed at 02/21/12 1338  Gross per 24 hour  Intake    232 ml  Output      0 ml  Net    232 ml   Filed Weights   02/21/12 0233 02/21/12 0625 02/22/12 0552  Weight: 88.134 kg (194 lb 4.8 oz) 88.134 kg (194 lb 4.8 oz) 87.408 kg (192 lb 11.2 oz)    Exam:  General:  Appears calm and comfortable. Sits up without difficulty. Cardiovascular: RRR, no m/r/g. No LE edema. Telemetry: Irregular rhythm, some paced QRS complexes. Normal rate. Respiratory: CTA bilaterally, no w/r/r. Normal respiratory effort. Psychiatric: grossly normal mood and affect, speech fluent and appropriate  Data Reviewed: Basic Metabolic Panel:  Lab 02/21/12 6578 02/20/12 2349  NA 138 137  K 4.1 3.4*  CL 101 99  CO2 27 26  GLUCOSE 97 109*  BUN 21 24*  CREATININE 1.05 1.12*  CALCIUM 9.4 9.4  MG -- --  PHOS -- --   Liver Function  Tests:  Lab 02/20/12 2349  AST 21  ALT 10  ALKPHOS 89  BILITOT 0.4  PROT 7.0  ALBUMIN 3.5    Lab 02/20/12 2349  LIPASE 47  AMYLASE --   CBC:  Lab 02/21/12 0440 02/20/12 2349    WBC 10.3 11.2*  NEUTROABS -- --  HGB 14.0 14.7  HCT 40.9 42.5  MCV 94.2 94.2  PLT 274 275   Cardiac Enzymes:  Lab 02/21/12 1549 02/21/12 0900 02/21/12 0440  CKTOTAL -- -- --  CKMB -- -- --  CKMBINDEX -- -- --  TROPONINI <0.30 <0.30 <0.30   Studies: Dg Chest 2 View  02/21/2012  *RADIOLOGY REPORT*  Clinical Data: Shortness of breath, chest heaviness, atrial fibrillation  CHEST - 2 VIEW  Comparison: Portable chest x-ray of 02/21/2012  Findings: The lungs are clear but hyperaerated.  The heart is mildly enlarged.  There is a large hiatal hernia noted which appears unchanged.  A dual lead permanent pacemaker is present. The bones are osteopenic with minimal compression of lower thoracic and upper lumbar vertebral bodies.  IMPRESSION:  1.  COPD.  No active lung disease. 2.  Large hiatal hernia is stable. 3.  Cardiomegaly with permanent pacemaker.   Original Report Authenticated By: Dwyane Dee, M.D.    Dg Chest Port 1 View  02/21/2012  *RADIOLOGY REPORT*  Clinical Data: Follow up left basilar opacity question atelectasis, exclude infiltrate  PORTABLE CHEST - 1 VIEW  Comparison: Portable exam 0624 hours compared to 02/20/2012  Findings: Left subclavian sequential transvenous pacemaker leads project at right atrium and right ventricle. Enlargement of cardiac silhouette. Atherosclerotic calcification aorta. Mediastinal contours and pulmonary vascularity otherwise normal. Skin fold projects over the right lung. Left lower lobe opacification and minimal right basilar atelectasis present. Upper lungs clear. No pleural effusion or pneumothorax. Bones demineralized.  IMPRESSION: New mild right basilar atelectasis. Persistent left lower lobe opacity question atelectasis versus infiltrate.   Original Report Authenticated By: Ulyses Southward, M.D.    Dg Chest Port 1 View  02/21/2012  *RADIOLOGY REPORT*  Clinical Data: New onset of left-sided chest pain.  PORTABLE CHEST - 1 VIEW  Comparison: Chest radiograph  performed 08/05/2010  Findings: The lungs are relatively well-aerated.  Mild left basilar opacity likely reflects atelectasis, with question of a small left pleural effusion.  There is no evidence of pneumothorax.  The cardiomediastinal silhouette is enlarged; a large hiatal hernia is again seen.  A pacemaker is noted overlying the left chest wall, with leads ending overlying the right atrium and right ventricle. No acute osseous abnormalities are seen.  IMPRESSION:  1.  Mild left basilar airspace opacity likely reflects atelectasis, with question of a small left pleural effusion. 2.  Cardiomegaly. 3.  Large hiatal hernia again seen.   Original Report Authenticated By: Tonia Ghent, M.D.     Scheduled Meds:   . aspirin EC  81 mg Oral Daily  . atorvastatin  10 mg Oral q1800  . bisoprolol  10 mg Oral BID  . diltiazem  120 mg Oral Daily  . hydrochlorothiazide  12.5 mg Oral Daily  . isosorbide mononitrate  30 mg Oral Daily  . levofloxacin (LEVAQUIN) IV  750 mg Intravenous Q48H  . pantoprazole  40 mg Oral Daily  . rOPINIRole  0.5 mg Oral QHS  . sodium chloride  3 mL Intravenous Q12H  . sodium chloride  3 mL Intravenous Q12H  . warfarin  3 mg Oral Q T,Th,S,Su-1800  . warfarin  4 mg Oral Q  M,W,F-1800  . Warfarin - Pharmacist Dosing Inpatient   Does not apply q1800   Continuous Infusions:   Principal Problem:  *Chest pain Active Problems:  Atrial fibrillation  PACEMAKER, PERMANENT  Chronic diastolic heart failure  PNA (pneumonia)     Brendia Sacks, MD  [facility] Team 5 Pager [phone] If 7PM-7AM, please contact night-coverage at [email or web address], password [facility] 02/22/2012, 12:48 PM  LOS: 2 days

## 2012-02-22 NOTE — Progress Notes (Signed)
Pt provided with dc instructions and education. Pt verbalized understanding. Pt educated on all new medications and has no questions. Pt aware to take levaquin every other day and to follow up with PCP for INR check. VSS at this time. Denies CP/SOB. Pt awaiting granddaughter to transport her home. Will continue to monitor until patient leaves the floor. Levonne Spiller, RN

## 2012-02-27 ENCOUNTER — Telehealth: Payer: Self-pay | Admitting: *Deleted

## 2012-02-27 ENCOUNTER — Encounter: Payer: Medicare PPO | Admitting: Internal Medicine

## 2012-02-27 NOTE — Telephone Encounter (Signed)
Spoke with patient.  She had an appointment scheduled with Dr Graciela Husbands today to evaluate noise reversions seen on Merlin transmission 01-30-2012.  She has been seen in the office on 02-14-2012 and her device was reprogrammed to VVI in the setting of permanent afib.  Appt for today cancelled.  We will follow remotely in April as previously scheduled.  Pt aware and agrees with plan.

## 2012-03-02 ENCOUNTER — Encounter: Payer: Medicare Other | Admitting: Internal Medicine

## 2012-05-14 ENCOUNTER — Encounter: Payer: Self-pay | Admitting: *Deleted

## 2012-05-16 ENCOUNTER — Encounter: Payer: Self-pay | Admitting: *Deleted

## 2012-05-23 ENCOUNTER — Other Ambulatory Visit: Payer: Self-pay

## 2012-05-23 ENCOUNTER — Ambulatory Visit (INDEPENDENT_AMBULATORY_CARE_PROVIDER_SITE_OTHER): Payer: Medicare PPO | Admitting: *Deleted

## 2012-05-23 DIAGNOSIS — I495 Sick sinus syndrome: Secondary | ICD-10-CM

## 2012-05-23 DIAGNOSIS — Z950 Presence of cardiac pacemaker: Secondary | ICD-10-CM

## 2012-05-23 LAB — REMOTE PACEMAKER DEVICE
BATTERY VOLTAGE: 2.96 V
DEVICE MODEL PM: 2285053
RV LEAD AMPLITUDE: 12 mv
RV LEAD IMPEDENCE PM: 640 Ohm
VENTRICULAR PACING PM: 17

## 2012-06-06 ENCOUNTER — Other Ambulatory Visit: Payer: Self-pay

## 2012-06-06 ENCOUNTER — Ambulatory Visit (INDEPENDENT_AMBULATORY_CARE_PROVIDER_SITE_OTHER): Payer: Medicare PPO | Admitting: *Deleted

## 2012-06-06 DIAGNOSIS — I4891 Unspecified atrial fibrillation: Secondary | ICD-10-CM

## 2012-06-06 DIAGNOSIS — I495 Sick sinus syndrome: Secondary | ICD-10-CM

## 2012-06-06 LAB — PACEMAKER DEVICE OBSERVATION
BATTERY VOLTAGE: 2.96 V
RV LEAD AMPLITUDE: 12 mv
RV LEAD IMPEDENCE PM: 640 Ohm
VENTRICULAR PACING PM: 16

## 2012-06-06 NOTE — Progress Notes (Signed)
Pacemaker check in clinic. Normal device function.

## 2012-07-03 ENCOUNTER — Encounter: Payer: Self-pay | Admitting: Internal Medicine

## 2012-07-05 ENCOUNTER — Encounter: Payer: Self-pay | Admitting: Internal Medicine

## 2012-08-27 ENCOUNTER — Encounter: Payer: Medicare PPO | Admitting: *Deleted

## 2012-08-29 ENCOUNTER — Encounter: Payer: Self-pay | Admitting: *Deleted

## 2012-09-26 ENCOUNTER — Ambulatory Visit (HOSPITAL_COMMUNITY)
Admission: RE | Admit: 2012-09-26 | Discharge: 2012-09-26 | Disposition: A | Discharge status: Home / Self Care | Payer: Medicare PPO | Source: Ambulatory Visit | Attending: Internal Medicine | Admitting: Internal Medicine

## 2012-09-26 ENCOUNTER — Encounter (HOSPITAL_COMMUNITY): Payer: Self-pay

## 2012-09-26 VITALS — BP 116/70 | HR 93 | Wt 202.8 lb

## 2012-09-26 DIAGNOSIS — I4891 Unspecified atrial fibrillation: Secondary | ICD-10-CM | POA: Insufficient documentation

## 2012-09-26 DIAGNOSIS — K219 Gastro-esophageal reflux disease without esophagitis: Secondary | ICD-10-CM | POA: Insufficient documentation

## 2012-09-26 DIAGNOSIS — R0602 Shortness of breath: Secondary | ICD-10-CM

## 2012-09-26 DIAGNOSIS — Z79899 Other long term (current) drug therapy: Secondary | ICD-10-CM | POA: Insufficient documentation

## 2012-09-26 DIAGNOSIS — R0609 Other forms of dyspnea: Secondary | ICD-10-CM | POA: Insufficient documentation

## 2012-09-26 DIAGNOSIS — E669 Obesity, unspecified: Secondary | ICD-10-CM | POA: Insufficient documentation

## 2012-09-26 DIAGNOSIS — I2510 Atherosclerotic heart disease of native coronary artery without angina pectoris: Secondary | ICD-10-CM | POA: Insufficient documentation

## 2012-09-26 DIAGNOSIS — E785 Hyperlipidemia, unspecified: Secondary | ICD-10-CM | POA: Insufficient documentation

## 2012-09-26 DIAGNOSIS — Z7901 Long term (current) use of anticoagulants: Secondary | ICD-10-CM | POA: Insufficient documentation

## 2012-09-26 DIAGNOSIS — M810 Age-related osteoporosis without current pathological fracture: Secondary | ICD-10-CM | POA: Insufficient documentation

## 2012-09-26 DIAGNOSIS — Z950 Presence of cardiac pacemaker: Secondary | ICD-10-CM | POA: Insufficient documentation

## 2012-09-26 DIAGNOSIS — I10 Essential (primary) hypertension: Secondary | ICD-10-CM | POA: Insufficient documentation

## 2012-09-26 DIAGNOSIS — R259 Unspecified abnormal involuntary movements: Secondary | ICD-10-CM | POA: Insufficient documentation

## 2012-09-26 DIAGNOSIS — R0989 Other specified symptoms and signs involving the circulatory and respiratory systems: Secondary | ICD-10-CM | POA: Insufficient documentation

## 2012-09-26 LAB — BASIC METABOLIC PANEL
BUN: 10 mg/dL (ref 6–23)
Chloride: 105 mEq/L (ref 96–112)
GFR calc Af Amer: 70 mL/min — ABNORMAL LOW (ref >90)
Glucose, Bld: 92 mg/dL (ref 70–99)
Potassium: 3.5 mEq/L (ref 3.5–5.1)
Sodium: 139 mEq/L (ref 135–145)

## 2012-09-26 MED ORDER — FUROSEMIDE 20 MG PO TABS: 20.0000 mg | ORAL_TABLET | Freq: Every day | ORAL | Status: DC

## 2012-09-26 NOTE — Progress Notes (Signed)
Patient ID: [name], female   DOB: 04/08/1923, 79 y.o.   MRN: [id number]  HPI: [name] is a delightful 79 year old woman with a history of syncope in the setting of atrial fibrillation c/b tachybrady syndrome s/p pacemaker placement in 2010, chronic dyspnea as well as hypertension and gastroesophageal reflux disease.  2011 Decreased bisprolol due to resting breadycardia and ordered a Myoview to further evaluate her chronic dyspnea. Myoview low risk. Had event monitor last year with PACs but no significant arrhythmias or pauses.   09/2010  LHC = Aortic pressure is 132/74 with a mean of 100 mmHg, left ventricular pressure is 139 with an EDP of 16 mmHg. L Main normal LAD 0% narrowing in the proximal and midvessel. The first diagonal branch has a 40% ostial stenosis. The left circumflex coronary artery has mild irregularities up to 10- 20%. The right coronary artery is a dominant vessel. It has mild wall irregularities less than or equal to 10%.   Follow up: Emergent work in appt for increased SOB. Reports that she gets SOB with talking on the phone and minimal activity. Weight at home increasing. Decreased appetite. + orthopnea. + Edema. Denies any CP. Complaint with medications. Coumadin monitored monthly. Denies bleeding problems.   ROS: All systems negative except as listed in HPI, PMH and Problem List.  Past Medical History  Diagnosis Date  . Paroxysmal atrial fibrillation     tachy-brady syndrome  . Hypertension   . Hyperlipidemia   . GERD (gastroesophageal reflux disease)   . Chest pain     with cath 2002 and 2004 showing mild non-obstructive CAD  . Osteoporosis   . Dyspnea     chronic; myoview 11/9. EF 81% probable soft-tissue attenuation in anterior and lat walls, can't exclude ischemia  . Obesity    Current Outpatient Prescriptions  Medication Sig Dispense Refill  . bisoprolol (ZEBETA) 10 MG tablet Take 10 mg by mouth 2 (two) times daily.      [name] diltiazem (CARDIZEM CD) 120  MG 24 hr capsule Take 1 capsule (120 mg total) by mouth daily.  30 capsule  11  . fish oil-omega-3 fatty acids 1000 MG capsule Take 2 g by mouth daily.      . hydrochlorothiazide (MICROZIDE) 12.5 MG capsule Take 12.5 mg by mouth daily.      . isosorbide mononitrate (IMDUR) 30 MG 24 hr tablet Take 30 mg by mouth daily.      [name] omeprazole (PRILOSEC) 20 MG capsule Take 20 mg by mouth daily.        [name] rOPINIRole (REQUIP) 0.5 MG tablet Take 0.5 mg by mouth at bedtime.      . simvastatin (ZOCOR) 20 MG tablet Take 20 mg by mouth every evening.      . warfarin (COUMADIN) 3 MG tablet Take 3 mg by mouth daily. On Sunday, Tuesday, Thursday and Saturdays      . warfarin (COUMADIN) 4 MG tablet Take 4 mg by mouth every Monday, Wednesday, and Friday.       No current facility-administered medications for this encounter.   Filed Vitals:   09/26/12 0908  BP: 116/70  Pulse: 93  Weight: 202 lb 12.8 oz (91.989 kg)  SpO2: 93%   Last weight 192 lbs.  PHYSICAL EXAM: General:  Elderly, chronically ill appearing. +diffuse  tremor. No acute distress. With grandaughter HEENT: normal except for scar from skin cancer removal ont he bridge of her nose Neck: supple. JVD difficult to assess d/t body habitus and  tremor. Carotids 2+ bilat; no bruits. No lymphadenopathy or thryomegaly appreciated. Cor: PMI nondisplaced. Irregular rate & rhythm. No rubs, gallops, murmur. Back + scoliosis/kyphosis. Lungs: clear Abdomen: soft, nontender,  Mildly distended.  Good bowel sounds. Extremities: no cyanosis, clubbing, rash, 1+ edema Neuro: alert & orientedx3, cranial nerves grossly intact. moves all 4 extremities w/o difficulty  ECG:  A-fib rate 77 occasional PVC  ASSESSMENT & PLAN:  1) A-fib - Chronic. Rate controlled. Will continue diltiazem and coumadin. Followed by EP.  2) Dyspnea - Increased SOB. Last ECHO 2011 EF 55-60%. Weight is up 10 lbs since January and she reports increased fatigue and SOB.  Will discuss with  Dr. Gala Romney about ordering ECHO. Will get BNP and BMET today. - Start lasix 20 mg daily. Will assess BMET to see whether we need to supplement K+  F/U 2-3 weeks  Ulla Potash B NP-C 1:10 PM

## 2012-09-26 NOTE — Patient Instructions (Addendum)
Start taking 20 mg lasix everyday.  Start weighing yourself daily and keep a record.  Will call with lab results.  F/U 2-3 weeks

## 2012-09-26 NOTE — Addendum Note (Signed)
Encounter addended by: Aundria Rud, NP on: 09/26/2012  4:46 PM<BR>     Documentation filed: Orders

## 2012-09-28 NOTE — Addendum Note (Signed)
Encounter addended by: Ernestina Penna on: 09/28/2012  9:07 AM<BR>     Documentation filed: Charges VN

## 2012-10-03 ENCOUNTER — Ambulatory Visit: Payer: Medicare PPO | Admitting: Physician Assistant

## 2012-10-09 ENCOUNTER — Ambulatory Visit (HOSPITAL_COMMUNITY)
Admission: RE | Admit: 2012-10-09 | Discharge: 2012-10-09 | Disposition: A | Discharge status: Home / Self Care | Payer: Medicare PPO | Source: Ambulatory Visit | Attending: Internal Medicine | Admitting: Internal Medicine

## 2012-10-09 ENCOUNTER — Encounter (HOSPITAL_COMMUNITY): Payer: Self-pay

## 2012-10-09 VITALS — BP 102/58 | HR 91 | Wt 200.2 lb

## 2012-10-09 DIAGNOSIS — R0989 Other specified symptoms and signs involving the circulatory and respiratory systems: Secondary | ICD-10-CM | POA: Insufficient documentation

## 2012-10-09 DIAGNOSIS — R0600 Dyspnea, unspecified: Secondary | ICD-10-CM

## 2012-10-09 DIAGNOSIS — I5032 Chronic diastolic (congestive) heart failure: Secondary | ICD-10-CM | POA: Insufficient documentation

## 2012-10-09 DIAGNOSIS — R0609 Other forms of dyspnea: Secondary | ICD-10-CM | POA: Insufficient documentation

## 2012-10-09 DIAGNOSIS — R42 Dizziness and giddiness: Secondary | ICD-10-CM

## 2012-10-09 LAB — COMPREHENSIVE METABOLIC PANEL
ALT: 11 U/L (ref 0–35)
Albumin: 3.4 g/dL — ABNORMAL LOW (ref 3.5–5.2)
Alkaline Phosphatase: 111 U/L (ref 39–117)
BUN: 9 mg/dL (ref 6–23)
Chloride: 104 mEq/L (ref 96–112)
Glucose, Bld: 90 mg/dL (ref 70–99)
Potassium: 3.5 mEq/L (ref 3.5–5.1)
Sodium: 139 mEq/L (ref 135–145)
Total Bilirubin: 0.4 mg/dL (ref 0.3–1.2)

## 2012-10-09 NOTE — Patient Instructions (Addendum)
Stop taking hydrochlorothiazide.   Will call with lab results.  Follow up in 4-6 weeks with ECHO.  Do the following things EVERYDAY: 1) Weigh yourself in the morning before breakfast. Write it down and keep it in a log. 2) Take your medicines as prescribed 3) Eat low salt foods-Limit salt (sodium) to 2000 mg per day.  4) Stay as active as you can everyday 5) Limit all fluids for the day to less than 2 liters 6)

## 2012-10-09 NOTE — Progress Notes (Addendum)
Patient ID:  JON, female   DOB: 1923-06-03, 79 y.o.   MRN: [id number]  HPI: Ms. Recht is a delightful 79 year old woman with a history of syncope in the setting of atrial fibrillation c/b tachybrady syndrome s/p pacemaker placement in 2010, chronic dyspnea as well as hypertension and gastroesophageal reflux disease.  2011 Decreased bisprolol due to resting breadycardia and ordered a Myoview to further evaluate her chronic dyspnea. Myoview low risk. Had event monitor last year with PACs but no significant arrhythmias or pauses.   09/2010  LHC = Aortic pressure is 132/74 with a mean of 100 mmHg, left ventricular pressure is 139 with an EDP of 16 mmHg. L Main normal LAD 0% narrowing in the proximal and midvessel. The first diagonal branch has a 40% ostial stenosis. The left circumflex coronary artery has mild irregularities up to 10- 20%. The right coronary artery is a dominant vessel. It has mild wall irregularities less than or equal to 10%.   Follow up: Last visit started lasix 20 mg daily. Reports feeling less SOB with initiation of lasix. Was able to walk from the car to the front desk with no SOB. Denies orthopnea, CP, or edema. Has not been taking Zocor regularly because she received something in the mail that there is an interaction between Zocor and Cardizem. Denies any bleeding problems. At night complains of heart feeling like going to jump out of her chest. Occasional "room spinning" when she lays down at night.   Labs 09/26/12: K 3.5, creatinine 0.84, proBNP 1265  ECHO 07/2009: EF 55-60%, mild MR, LA mod dilated, RA mild dilated  SH: Lives in [place] with her husband.  ROS: All systems negative except as listed in HPI, PMH and Problem List.  Past Medical History  Diagnosis Date  . Paroxysmal atrial fibrillation     tachy-brady syndrome  . Hypertension   . Hyperlipidemia   . GERD (gastroesophageal reflux disease)   . Chest pain     with cath 2002 and 2004 showing mild  non-obstructive CAD  . Osteoporosis   . Dyspnea     chronic; myoview 11/9. EF 81% probable soft-tissue attenuation in anterior and lat walls, can't exclude ischemia  . Obesity    Current Outpatient Prescriptions  Medication Sig Dispense Refill  . bisoprolol (ZEBETA) 10 MG tablet Take 10 mg by mouth 2 (two) times daily.      [name] diltiazem (CARDIZEM CD) 120 MG 24 hr capsule Take 1 capsule (120 mg total) by mouth daily.  30 capsule  11  . fish oil-omega-3 fatty acids 1000 MG capsule Take 2 g by mouth daily.      . furosemide (LASIX) 20 MG tablet Take 1 tablet (20 mg total) by mouth daily.  30 tablet  6  . hydrochlorothiazide (MICROZIDE) 12.5 MG capsule Take 12.5 mg by mouth daily.      . isosorbide mononitrate (IMDUR) 30 MG 24 hr tablet Take 30 mg by mouth daily.      [name] omeprazole (PRILOSEC) 20 MG capsule Take 20 mg by mouth daily.        [name] rOPINIRole (REQUIP) 0.5 MG tablet Take 0.5 mg by mouth at bedtime.      . simvastatin (ZOCOR) 20 MG tablet Take 20 mg by mouth every evening.      . warfarin (COUMADIN) 3 MG tablet Take 3 mg by mouth daily. On Sunday, Tuesday, Thursday and Saturdays      . warfarin (COUMADIN) 4 MG tablet Take 4 mg by  mouth every Monday, Wednesday, and Friday.       No current facility-administered medications for this encounter.   Filed Vitals:   10/09/12 1256  BP: 102/58  Pulse: 91  Weight: 200 lb 4 oz (90.833 kg)  SpO2: 92%  Last weight: 202 lbs  PHYSICAL EXAM: General:  Elderly, chronically ill appearing. +diffuse  tremor. No acute distress. With grandaughter HEENT: normal; scrap on L elbow Neck: supple. JVD difficult to assess d/t body habitus and tremor. Carotids 2+ bilat; no bruits. No lymphadenopathy or thryomegaly appreciated. Cor: PMI nondisplaced. Irregular rate & rhythm. No rubs, gallops, murmur. Back + scoliosis/kyphosis. Lungs: clear Abdomen: soft, nontender,  Mildly distended.  Good bowel sounds. Extremities: no cyanosis, clubbing, rash, trace  edema Neuro: alert & orientedx3, cranial nerves grossly intact. moves all 4 extremities w/o difficulty  ASSESSMENT & PLAN:  1) A-fib - Chronic. Rate controlled. Will continue diltiazem and coumadin. Followed by EP. Will get CMET today due to FDA warning of using diltiazem and simvastatin together to check LFTs. Change simvastatin to 10 mg atorvastatin.   2) Dyspnea - Improved since initiation of lasix. Will continue at current dose. Will check CMET today. Instructed patient on the use of sliding scale diuretics, with stopping of HCTZ may need higher dose of lasix.  - Will get ECHO in 1 month.  3) Dizziness - Will stop HCTZ to see if this helps with dizziness. Instructed patient to get up slowly.   Ulla Potash B NP-C 1:01 PM

## 2012-10-11 ENCOUNTER — Telehealth (HOSPITAL_COMMUNITY): Payer: Self-pay | Admitting: *Deleted

## 2012-10-11 MED ORDER — POTASSIUM CHLORIDE CRYS ER 20 MEQ PO TBCR: 20.0000 meq | EXTENDED_RELEASE_TABLET | Freq: Every day | ORAL | Status: DC

## 2012-10-11 MED ORDER — ATORVASTATIN CALCIUM 10 MG PO TABS: 10.0000 mg | ORAL_TABLET | Freq: Every day | ORAL | Status: DC

## 2012-10-11 NOTE — Addendum Note (Signed)
Encounter addended by: Aundria Rud, NP on: 10/11/2012  1:44 PM<BR>     Documentation filed: Notes Section

## 2012-10-11 NOTE — Telephone Encounter (Signed)
Marilyn Potash, NP reviewed pt's labs K 3.5 will start pt on Kdur 20 meq daily, also will switch pt from simvastatin to atorvastatin due to interaction with diltiazem, pt is aware of all and new prescriptions have been sent in

## 2012-11-07 ENCOUNTER — Ambulatory Visit (HOSPITAL_COMMUNITY)
Admission: RE | Admit: 2012-11-07 | Discharge: 2012-11-07 | Disposition: A | Discharge status: Home / Self Care | Payer: Medicare PPO | Source: Ambulatory Visit | Attending: Family Medicine | Admitting: Family Medicine

## 2012-11-07 ENCOUNTER — Ambulatory Visit (HOSPITAL_BASED_OUTPATIENT_CLINIC_OR_DEPARTMENT_OTHER)
Admission: RE | Admit: 2012-11-07 | Discharge: 2012-11-07 | Disposition: A | Discharge status: Home / Self Care | Payer: Medicare PPO | Source: Ambulatory Visit | Attending: Cardiology | Admitting: Cardiology

## 2012-11-07 VITALS — BP 142/92 | HR 88 | Wt 200.5 lb

## 2012-11-07 DIAGNOSIS — R0989 Other specified symptoms and signs involving the circulatory and respiratory systems: Secondary | ICD-10-CM | POA: Insufficient documentation

## 2012-11-07 DIAGNOSIS — I5032 Chronic diastolic (congestive) heart failure: Secondary | ICD-10-CM

## 2012-11-07 DIAGNOSIS — Z8249 Family history of ischemic heart disease and other diseases of the circulatory system: Secondary | ICD-10-CM | POA: Insufficient documentation

## 2012-11-07 DIAGNOSIS — R0602 Shortness of breath: Secondary | ICD-10-CM

## 2012-11-07 DIAGNOSIS — E785 Hyperlipidemia, unspecified: Secondary | ICD-10-CM | POA: Insufficient documentation

## 2012-11-07 DIAGNOSIS — I10 Essential (primary) hypertension: Secondary | ICD-10-CM | POA: Insufficient documentation

## 2012-11-07 DIAGNOSIS — I059 Rheumatic mitral valve disease, unspecified: Secondary | ICD-10-CM

## 2012-11-07 DIAGNOSIS — I4891 Unspecified atrial fibrillation: Secondary | ICD-10-CM

## 2012-11-07 DIAGNOSIS — R0609 Other forms of dyspnea: Secondary | ICD-10-CM | POA: Insufficient documentation

## 2012-11-07 MED ORDER — POTASSIUM CHLORIDE CRYS ER 20 MEQ PO TBCR: 40.0000 meq | EXTENDED_RELEASE_TABLET | Freq: Every day | ORAL | Status: DC

## 2012-11-07 MED ORDER — FUROSEMIDE 20 MG PO TABS: 40.0000 mg | ORAL_TABLET | Freq: Every day | ORAL | Status: DC

## 2012-11-07 NOTE — Progress Notes (Signed)
  Echocardiogram 2D Echocardiogram has been performed.  Leenah Seidner 11/07/2012, 11:55 AM

## 2012-11-07 NOTE — Progress Notes (Signed)
Patient ID: YOLANI VO, female   DOB: 11/25/1923, 79 y.o.   MRN: [id number]  HPI: Ms. Miggins is a delightful 79 year old woman with a history of syncope in the setting of atrial fibrillation c/b tachybrady syndrome s/p pacemaker placement in 2010, chronic dyspnea as well as hypertension and gastroesophageal reflux disease.  2011 Decreased bisprolol due to resting breadycardia and ordered a Myoview to further evaluate her chronic dyspnea. Myoview low risk. Had event monitor last year with PACs but no significant arrhythmias or pauses.   09/2010  LHC = Aortic pressure is 132/74 with a mean of 100 mmHg, left ventricular pressure is 139 with an EDP of 16 mmHg. L Main normal LAD 0% narrowing in the proximal and midvessel. The first diagonal branch has a 40% ostial stenosis. The left circumflex coronary artery has mild irregularities up to 10- 20%. The right coronary artery is a dominant vessel. It has mild wall irregularities less than or equal to 10%.   Follow up: Last visit changed simvastatin to atorvastatin 10 mg daily, stopped HCTZ and started potassium chloride 20 meq daily. Reports she still has DOE with minimal exertion. Weight at home 198-200 lbs. Denies orthopnea, CP, or edema. No melena or BRBPR. Still having sensations of when she feels like her heart is going to jump out of chest, happens whenever she gets SOB. Dizziness improved.   Labs 09/26/12: K 3.5, creatinine 0.84, proBNP 1265          10/09/12: K 3.5, Cr 0.83, AST 21, ALT 11  ECHO 07/2009: EF 55-60%, mild MR, LA mod dilated, RA mild dilated ECHO 10/14: EF 55-60%, biatrial enlargement, normal RV size/systolic function, PA systolic pressure 39 mmHg.   SH: Lives in [place] with her husband, nonsmoker.   ROS: All systems negative except as listed in HPI, PMH and Problem List.  Past Medical History  Diagnosis Date  . Paroxysmal atrial fibrillation     tachy-brady syndrome  . Hypertension   . Hyperlipidemia   . GERD (gastroesophageal  reflux disease)   . Chest pain     with cath 2002 and 2004 showing mild non-obstructive CAD  . Osteoporosis   . Dyspnea     chronic; myoview 11/9. EF 81% probable soft-tissue attenuation in anterior and lat walls, can't exclude ischemia  . Obesity    Current Outpatient Prescriptions  Medication Sig Dispense Refill  . atorvastatin (LIPITOR) 10 MG tablet Take 1 tablet (10 mg total) by mouth daily.  30 tablet  6  . bisoprolol (ZEBETA) 10 MG tablet Take 10 mg by mouth 2 (two) times daily.      [name] diltiazem (CARDIZEM CD) 120 MG 24 hr capsule Take 1 capsule (120 mg total) by mouth daily.  30 capsule  11  . fish oil-omega-3 fatty acids 1000 MG capsule Take 2 g by mouth daily.      . furosemide (LASIX) 20 MG tablet Take 2 tablets (40 mg total) by mouth daily.  60 tablet  6  . isosorbide mononitrate (IMDUR) 30 MG 24 hr tablet Take 30 mg by mouth daily.      [name] omeprazole (PRILOSEC) 20 MG capsule Take 20 mg by mouth daily.        . potassium chloride SA (K-DUR,KLOR-CON) 20 MEQ tablet Take 2 tablets (40 mEq total) by mouth daily.  60 tablet  6  . rOPINIRole (REQUIP) 0.5 MG tablet Take 0.5 mg by mouth at bedtime.      [name] warfarin (COUMADIN) 3 MG tablet Take  3 mg by mouth daily. On Sunday, Tuesday, Thursday and Saturdays      . warfarin (COUMADIN) 4 MG tablet Take 4 mg by mouth every Monday, Wednesday, and Friday.       No current facility-administered medications for this encounter.   Filed Vitals:   11/07/12 1200  BP: 142/92  Pulse: 88  Weight: 200 lb 8 oz (90.946 kg)  SpO2: 97%  Last weight: 202 lbs  PHYSICAL EXAM: General:  Elderly, chronically ill appearing. +diffuse  tremor. No acute distress. With grandaughter HEENT: normal; scrap on L elbow Neck: supple. JVD difficult to assess d/t body habitus and tremor. Carotids 2+ bilat; no bruits. No lymphadenopathy or thryomegaly appreciated. Cor: PMI nondisplaced. Irregular rate & rhythm. No rubs, gallops, murmur. Back +  scoliosis/kyphosis. Lungs: clear Abdomen: soft, nontender,  Mildly distended.  Good bowel sounds. Extremities: no cyanosis, clubbing, rash, trace edema Neuro: alert & orientedx3, cranial nerves grossly intact. moves all 4 extremities w/o difficulty  ASSESSMENT & PLAN:  1) A-fib - Chronic. EKG today 78 bpm rate controlled. Will continue diltiazem and coumadin.   2) Dyspnea - Continues to struggle with chronic dyspnea. Last visit stopped HCTZ and started lasix. Volume status does not appear elevated however with continued SOB and last pro-BNP being elevated will increase lastix to 40 mg daily. Will also increase K+ to 40 meq daily. Check BMET and pro-BNP in 1 week.  3) Dizziness - Improved since discontinuation of HCTZ. She notices it most when she lies down at night. 4) Chronic Diastolic HF, EF 55-60% - ECHO reviewed today by Dr. Shirlee Latch - EF remains stable. NYHA III symptoms. As above increase lasix. -Reinforced the need and importance of daily weights, a low sodium diet, and fluid restriction (less than 2 L a day). Instructed to call the HF clinic if weight increases more than 3 lbs overnight or 5 lbs in a week.    Ulla Potash B NP-C 12:47 PM  Patient seen with NP, agree with the above note.  Patient has chronic atrial fibrillation and chronic diastolic CHF.  Stable echo today.  We will try increasing Lasix to 40 mg daily to see if this helps with her breathing.  BMET/BNP in 1 week.   Marca Ancona 11/08/2012

## 2012-11-07 NOTE — Patient Instructions (Signed)
Increase lasix to 40 mg daily (2 tablets).  Increase potassium to 40 meq (2 tablets) daily.  Labs next week at Coumadin appt. Have them fax to [phone]  F/U 1 month  Do the following things EVERYDAY: 1) Weigh yourself in the morning before breakfast. Write it down and keep it in a log. 2) Take your medicines as prescribed 3) Eat low salt foods-Limit salt (sodium) to 2000 mg per day.  4) Stay as active as you can everyday 5) Limit all fluids for the day to less than 2 liters 6)

## 2012-11-09 NOTE — Addendum Note (Signed)
Encounter addended by: Simon Rhein, CCT on: 11/09/2012  8:58 AM<BR>     Documentation filed: Charges VN

## 2012-11-10 ENCOUNTER — Other Ambulatory Visit: Payer: Self-pay | Admitting: Internal Medicine

## 2012-11-28 ENCOUNTER — Other Ambulatory Visit: Payer: Self-pay | Admitting: Internal Medicine

## 2012-12-03 ENCOUNTER — Encounter (HOSPITAL_COMMUNITY): Payer: Self-pay

## 2012-12-03 ENCOUNTER — Ambulatory Visit (HOSPITAL_COMMUNITY)
Admission: RE | Admit: 2012-12-03 | Discharge: 2012-12-03 | Disposition: A | Discharge status: Home / Self Care | Payer: Medicare PPO | Source: Ambulatory Visit | Attending: Internal Medicine | Admitting: Internal Medicine

## 2012-12-03 VITALS — BP 142/88 | HR 60 | Wt 200.0 lb

## 2012-12-03 DIAGNOSIS — R0600 Dyspnea, unspecified: Secondary | ICD-10-CM

## 2012-12-03 DIAGNOSIS — I5032 Chronic diastolic (congestive) heart failure: Secondary | ICD-10-CM

## 2012-12-03 DIAGNOSIS — R0609 Other forms of dyspnea: Secondary | ICD-10-CM | POA: Insufficient documentation

## 2012-12-03 DIAGNOSIS — R0989 Other specified symptoms and signs involving the circulatory and respiratory systems: Secondary | ICD-10-CM

## 2012-12-03 DIAGNOSIS — I4891 Unspecified atrial fibrillation: Secondary | ICD-10-CM

## 2012-12-03 LAB — URINALYSIS, ROUTINE W REFLEX MICROSCOPIC
Bilirubin Urine: NEGATIVE
Glucose, UA: NEGATIVE mg/dL
Ketones, ur: NEGATIVE mg/dL
Protein, ur: NEGATIVE mg/dL
pH: 6.5 (ref 5.0–8.0)

## 2012-12-03 LAB — URINE MICROSCOPIC-ADD ON

## 2012-12-03 MED ORDER — POTASSIUM CHLORIDE CRYS ER 20 MEQ PO TBCR: 20.0000 meq | EXTENDED_RELEASE_TABLET | Freq: Every day | ORAL | Status: DC

## 2012-12-03 MED ORDER — FUROSEMIDE 20 MG PO TABS: 20.0000 mg | ORAL_TABLET | Freq: Every day | ORAL | Status: DC

## 2012-12-03 NOTE — Patient Instructions (Signed)
Will not change any medications.  Follow up 2 months.  Call any issues [phone]  Bring all your medications with you to next visit  Do the following things EVERYDAY: 1) Weigh yourself in the morning before breakfast. Write it down and keep it in a log. 2) Take your medicines as prescribed 3) Eat low salt foods-Limit salt (sodium) to 2000 mg per day.  4) Stay as active as you can everyday 5) Limit all fluids for the day to less than 2 liters 6)

## 2012-12-03 NOTE — Progress Notes (Signed)
Patient ID: NEALY HICKMON, female   DOB: 08/21/1923, 79 y.o.   MRN: [id number]  HPI: Ms. Grauberger is a delightful 79 year old woman with a history of syncope in the setting of atrial fibrillation c/b tachybrady syndrome s/p pacemaker placement in 2010, chronic dyspnea as well as hypertension and gastroesophageal reflux disease.  2011 Decreased bisprolol due to resting breadycardia and ordered a Myoview to further evaluate her chronic dyspnea. Myoview low risk. Had event monitor last year with PACs but no significant arrhythmias or pauses.   09/2010  LHC = Aortic pressure is 132/74 with a mean of 100 mmHg, left ventricular pressure is 139 with an EDP of 16 mmHg. L Main normal LAD 0% narrowing in the proximal and midvessel. The first diagonal branch has a 40% ostial stenosis. The left circumflex coronary artery has mild irregularities up to 10- 20%. The right coronary artery is a dominant vessel. It has mild wall irregularities less than or equal to 10%.   Follow up: Last visit increased lasix to 40 mg daily along with K+ to 40 meq daily, however she never increased. Went last Thursday for blood work, but [employer] would not pay for pro-BNP, awaiting BMET results. Feels pretty good. Still has DOE with minimal exertion, but after resting it gets better. Denies CP, edema or orthopnea. +dizziness with lying down, feels like bed is moving around. + dysuria and foul odor  Labs 09/26/12: K 3.5, creatinine 0.84, proBNP 1265          10/09/12: K 3.5, Cr 0.83, AST 21, ALT 11  ECHO 07/2009: EF 55-60%, mild MR, LA mod dilated, RA mild dilated ECHO 10/2012: EF 55-60%, biatrial enlargement, normal RV size/systolic function, PA systolic pressure 39 mmHg.   SH: Lives in [place] with her husband, nonsmoker.   ROS: All systems negative except as listed in HPI, PMH and Problem List.  Past Medical History  Diagnosis Date  . Paroxysmal atrial fibrillation     tachy-brady syndrome  . Hypertension   . Hyperlipidemia   .  GERD (gastroesophageal reflux disease)   . Chest pain     with cath 2002 and 2004 showing mild non-obstructive CAD  . Osteoporosis   . Dyspnea     chronic; myoview 11/9. EF 81% probable soft-tissue attenuation in anterior and lat walls, can't exclude ischemia  . Obesity    Current Outpatient Prescriptions  Medication Sig Dispense Refill  . atorvastatin (LIPITOR) 10 MG tablet Take 1 tablet (10 mg total) by mouth daily.  30 tablet  6  . bisoprolol (ZEBETA) 10 MG tablet Take 10 mg by mouth 2 (two) times daily.      [name] diltiazem (CARDIZEM CD) 120 MG 24 hr capsule Take 1 capsule (120 mg total) by mouth daily.  30 capsule  11  . fish oil-omega-3 fatty acids 1000 MG capsule Take 2 g by mouth daily.      . furosemide (LASIX) 20 MG tablet Take 2 tablets (40 mg total) by mouth daily.  60 tablet  6  . isosorbide mononitrate (IMDUR) 30 MG 24 hr tablet TAKE 1 TABLET DAILY.  90 tablet  1  . omeprazole (PRILOSEC) 20 MG capsule Take 20 mg by mouth daily.        . potassium chloride SA (K-DUR,KLOR-CON) 20 MEQ tablet Take 2 tablets (40 mEq total) by mouth daily.  60 tablet  6  . rOPINIRole (REQUIP) 0.5 MG tablet Take 0.5 mg by mouth at bedtime.      [name] warfarin (COUMADIN)  3 MG tablet Take 3 mg by mouth daily. On Sunday, Tuesday, Thursday and Saturdays      . warfarin (COUMADIN) 4 MG tablet Take 4 mg by mouth every Monday, Wednesday, and Friday.       No current facility-administered medications for this encounter.   Filed Vitals:   12/03/12 0938  BP: 142/88  Pulse: 60  Weight: 200 lb (90.719 kg)  SpO2: 98%    PHYSICAL EXAM: General:  Elderly, chronically ill appearing. +diffuse  tremor. No acute distress.  HEENT: normal;  Neck: supple. JVD difficult to assess d/t body habitus and tremor. Carotids 2+ bilat; no bruits. No lymphadenopathy or thryomegaly appreciated. Cor: PMI nondisplaced. regular rate & irregular rhythm. No rubs, gallops, murmur. Back + scoliosis/kyphosis. Lungs: clear Abdomen: soft,  nontender,  not distended.  Good bowel sounds. Extremities: no cyanosis, clubbing, rash, trace edema Neuro: alert & orientedx3, cranial nerves grossly intact. moves all 4 extremities w/o difficulty  ASSESSMENT & PLAN:  1) A-fib - Chronic. Rate controlled. Will continue diltiazem and coumadin.   2) Dyspnea - Patient was to increase lasix to 40 mg daily last visit, however reports today she did not increase because she was worried that maybe too much because she was not taking her lasix 20 mg daily (she reports missing doses). Have asked the patient to take her lasix 20 mg daily along with K+ 20 meq daily and if her weight is trending up or she notices increased dyspnea to increase lasix 40 mg daily with K+ 40 meq. Awaiting BMET results from PCP. Insurance won't cover pro-BNP. 3) Dizziness - Improved since discontinuation of HCTZ. She notices it most when she lies down at night. 4) Chronic Diastolic HF, EF 55-60% (10/2012) - NYHA III symptoms and volume status appears at baseline. She continues to have SOB, however am concerned that may be lung related and not all fluid. Have asked her as above to take lasix 20 mg daily and to increase to 40 mg if she feels increased SOB or weight is trending up. May need PFTs to help work through SOB.  -Reinforced the need and importance of daily weights, a low sodium diet, and fluid restriction (less than 2 L a day). Instructed to call the HF clinic if weight increases more than 3 lbs overnight or 5 lbs in a week.  5) Dysuria - Will order urinalysis and urine culture, likely UTI.   F/U 2 months  Ulla Potash B NP-C 10:16 AM

## 2012-12-04 ENCOUNTER — Telehealth (HOSPITAL_COMMUNITY): Payer: Self-pay | Admitting: Anesthesiology

## 2012-12-04 ENCOUNTER — Other Ambulatory Visit: Payer: Self-pay | Admitting: Internal Medicine

## 2012-12-04 DIAGNOSIS — R0600 Dyspnea, unspecified: Secondary | ICD-10-CM | POA: Insufficient documentation

## 2012-12-04 MED ORDER — CIPROFLOXACIN HCL 250 MG PO TABS: 250.0000 mg | ORAL_TABLET | Freq: Two times a day (BID) | ORAL | Status: DC

## 2012-12-04 NOTE — Telephone Encounter (Signed)
Called patient about results from urinalysis yesterday. Sent prescription for ciprofloxacin 250 mg BID for 3 days # 6. Explained to the patient that she will need to call her PCP who manages her Coumadin to let them know she is starting this medication and she reports she understands.

## 2012-12-05 LAB — URINE CULTURE

## 2012-12-10 ENCOUNTER — Encounter (HOSPITAL_COMMUNITY): Payer: Medicare PPO

## 2012-12-26 ENCOUNTER — Encounter: Payer: Self-pay | Admitting: Internal Medicine

## 2013-02-20 ENCOUNTER — Encounter: Payer: Self-pay | Admitting: *Deleted

## 2013-03-05 ENCOUNTER — Encounter: Payer: Self-pay | Admitting: Internal Medicine

## 2013-03-05 ENCOUNTER — Ambulatory Visit (INDEPENDENT_AMBULATORY_CARE_PROVIDER_SITE_OTHER): Payer: Medicare HMO | Admitting: Internal Medicine

## 2013-03-05 VITALS — BP 133/89 | HR 78 | Ht 60.0 in | Wt 200.5 lb

## 2013-03-05 DIAGNOSIS — I5032 Chronic diastolic (congestive) heart failure: Secondary | ICD-10-CM

## 2013-03-05 DIAGNOSIS — I10 Essential (primary) hypertension: Secondary | ICD-10-CM

## 2013-03-05 DIAGNOSIS — I4891 Unspecified atrial fibrillation: Secondary | ICD-10-CM

## 2013-03-05 DIAGNOSIS — Z45018 Encounter for adjustment and management of other part of cardiac pacemaker: Secondary | ICD-10-CM

## 2013-03-05 DIAGNOSIS — Z950 Presence of cardiac pacemaker: Secondary | ICD-10-CM

## 2013-03-05 LAB — MDC_IDC_ENUM_SESS_TYPE_INCLINIC
Battery Remaining Longevity: 117.6 mo
Battery Voltage: 2.96 V
Brady Statistic RA Percent Paced: 0 %
Implantable Pulse Generator Model: 2110
Implantable Pulse Generator Serial Number: 2285053
Lead Channel Pacing Threshold Amplitude: 1 V
Lead Channel Pacing Threshold Pulse Width: 0.4 ms
Lead Channel Setting Pacing Amplitude: 2.5 V
Lead Channel Setting Pacing Pulse Width: 0.4 ms
Lead Channel Setting Sensing Sensitivity: 2 mV
MDC IDC MSMT LEADCHNL RV IMPEDANCE VALUE: 637.5 Ohm
MDC IDC MSMT LEADCHNL RV SENSING INTR AMPL: 12 mV
MDC IDC SESS DTM: 20150203110023
MDC IDC STAT BRADY RV PERCENT PACED: 17 %

## 2013-03-05 MED ORDER — DILTIAZEM HCL ER COATED BEADS 120 MG PO CP24: 120.0000 mg | ORAL_CAPSULE | Freq: Two times a day (BID) | ORAL | Status: DC

## 2013-03-05 NOTE — Assessment & Plan Note (Signed)
The patient's device was interrogated.  The information was reviewed. No changes were made in the programming.    

## 2013-03-05 NOTE — Progress Notes (Signed)
Patient Care Team: Charlott Rakes as PCP - General (Family Medicine) Charlott Rakes as Referring Physician (Family Medicine)   HPI  [name] is a 80 y.o. female Seen in followup for pacer implanted for tachybrady and now with permanent AF She continues to complain of significant dyspnea on exertion which is relieved by rest. This did not change appreciably with the increase in her diuretic. It did make her urinate not withstanding.    She has intercurrently been [facility] at [facility] for flu; she is much improved..it was associated with severe hypoxemia and hypotension  she is [name] grandmother.    She has been followed by CHF clinic. DOE rx with increasing diuretics  Echo 10/14 EF>>55-60% Past Medical History  Diagnosis Date  . Paroxysmal atrial fibrillation     tachy-brady syndrome  . Hypertension   . Hyperlipidemia   . GERD (gastroesophageal reflux disease)   . Chest pain     with cath 2002 and 2004 showing mild non-obstructive CAD  . Osteoporosis   . Dyspnea     chronic; myoview 11/9. EF 81% probable soft-tissue attenuation in anterior and lat walls, can't exclude ischemia  . Obesity     Past Surgical History  Procedure Laterality Date  . Cardiac catheterization    . Breast lumpectomy      right  . Total abdominal hysterectomy w/ bilateral salpingoophorectomy  1974  . Colectomy  1991  . Pacemaker insertion      [employer]    Current Outpatient Prescriptions  Medication Sig Dispense Refill  . atorvastatin (LIPITOR) 10 MG tablet Take 1 tablet (10 mg total) by mouth daily.  30 tablet  6  . bisoprolol (ZEBETA) 10 MG tablet TAKE 1 TABLET BY MOUTH TWICE DAILY.  60 tablet  1  . diltiazem (CARDIZEM CD) 120 MG 24 hr capsule Take 1 capsule (120 mg total) by mouth daily.  30 capsule  11  . fish oil-omega-3 fatty acids 1000 MG capsule Take 2 g by mouth as needed.       . furosemide (LASIX) 20 MG tablet Take 1 tablet (20 mg total) by mouth  daily.  30 tablet  6  . isosorbide mononitrate (IMDUR) 30 MG 24 hr tablet TAKE 1 TABLET DAILY.  90 tablet  1  . omeprazole (PRILOSEC) 20 MG capsule Take 20 mg by mouth daily.        . potassium chloride SA (K-DUR,KLOR-CON) 20 MEQ tablet Take 1 tablet (20 mEq total) by mouth daily.  30 tablet  6  . rOPINIRole (REQUIP) 0.5 MG tablet Take 0.5 mg by mouth at bedtime.      [name] warfarin (COUMADIN) 3 MG tablet Take 3 mg by mouth daily. On Sunday, Tuesday, Thursday and Saturdays      . warfarin (COUMADIN) 4 MG tablet Take 4 mg by mouth every Monday, Wednesday, and Friday.       No current facility-administered medications for this visit.    Allergies  Allergen Reactions  . Morphine And Related Other (See Comments)    hallucinations    Review of Systems negative except from HPI and PMH  Physical Exam BP 141/92  Pulse 86  Ht 5' (1.524 m)  Wt 200 lb 8 oz (90.946 kg)  BMI 39.16 kg/m2 Well developed and well nourished in no acute distress HENT normal E scleral and icterus clear Neck Supple JVP flat; carotids brisk and full Clear to ausculation Device pocket well healed; without hematoma or  erythema.  There is no tethering Regular rate and rhythm, no murmurs gallops or rub Soft with active bowel sounds No clubbing cyanosis tr Edema Alert and oriented, grossly normal motor and sensory function Skin Warm and Dry    Assessment and  Plan

## 2013-03-05 NOTE — Assessment & Plan Note (Signed)
As above.

## 2013-03-05 NOTE — Assessment & Plan Note (Signed)
Reasonably controlled 

## 2013-03-05 NOTE — Assessment & Plan Note (Signed)
This remains an ongoing issue with class III symptoms. One thing that is notable in that her heart rate excursion is relatively rapid 10% of her heart beats faster than 100 beats per minute. She is currently taking maximal dose bisoprolol; we'll increase her diltiazem 120--240 to see if we can't improve things that way. she is euvolemic.

## 2013-03-05 NOTE — Patient Instructions (Addendum)
Your physician has recommended you make the following change in your medication:  Diltiazem 120 mg twice a day   Follow up with CHF clinic in 2-3 weeks   Your physician wants you to follow-up in: Graciela Husbands 12 months. You will receive a reminder letter in the mail two months in advance. If you don't receive a letter, please call our office to schedule the follow-up appointment.  Remote monitoring is used to monitor your Pacemaker of ICD from home. This monitoring reduces the number of office visits required to check your device to one time per year. It allows [place] to keep an eye on the functioning of your device to ensure it is working properly. You are scheduled for a device check from home on 06/06/13. You may send your transmission at any time that day. If you have a wireless device, the transmission will be sent automatically. After your physician reviews your transmission, you will receive a postcard with your next transmission date.

## 2013-03-21 ENCOUNTER — Encounter (HOSPITAL_COMMUNITY): Payer: Medicare HMO

## 2013-04-10 ENCOUNTER — Other Ambulatory Visit: Payer: Self-pay | Admitting: Internal Medicine

## 2013-04-10 ENCOUNTER — Ambulatory Visit (HOSPITAL_COMMUNITY)
Admission: RE | Admit: 2013-04-10 | Discharge: 2013-04-10 | Disposition: A | Discharge status: Home / Self Care | Payer: Medicare HMO | Source: Ambulatory Visit | Attending: Internal Medicine | Admitting: Internal Medicine

## 2013-04-10 VITALS — BP 124/76 | HR 80 | Wt 199.5 lb

## 2013-04-10 DIAGNOSIS — Z79899 Other long term (current) drug therapy: Secondary | ICD-10-CM | POA: Insufficient documentation

## 2013-04-10 DIAGNOSIS — K219 Gastro-esophageal reflux disease without esophagitis: Secondary | ICD-10-CM | POA: Insufficient documentation

## 2013-04-10 DIAGNOSIS — Z7901 Long term (current) use of anticoagulants: Secondary | ICD-10-CM | POA: Insufficient documentation

## 2013-04-10 DIAGNOSIS — I509 Heart failure, unspecified: Secondary | ICD-10-CM | POA: Insufficient documentation

## 2013-04-10 DIAGNOSIS — R0602 Shortness of breath: Secondary | ICD-10-CM

## 2013-04-10 DIAGNOSIS — I5032 Chronic diastolic (congestive) heart failure: Secondary | ICD-10-CM

## 2013-04-10 DIAGNOSIS — I4891 Unspecified atrial fibrillation: Secondary | ICD-10-CM

## 2013-04-10 DIAGNOSIS — R0989 Other specified symptoms and signs involving the circulatory and respiratory systems: Secondary | ICD-10-CM | POA: Insufficient documentation

## 2013-04-10 DIAGNOSIS — Z950 Presence of cardiac pacemaker: Secondary | ICD-10-CM | POA: Insufficient documentation

## 2013-04-10 DIAGNOSIS — M810 Age-related osteoporosis without current pathological fracture: Secondary | ICD-10-CM | POA: Insufficient documentation

## 2013-04-10 DIAGNOSIS — I10 Essential (primary) hypertension: Secondary | ICD-10-CM | POA: Insufficient documentation

## 2013-04-10 DIAGNOSIS — E669 Obesity, unspecified: Secondary | ICD-10-CM | POA: Insufficient documentation

## 2013-04-10 DIAGNOSIS — E785 Hyperlipidemia, unspecified: Secondary | ICD-10-CM | POA: Insufficient documentation

## 2013-04-10 DIAGNOSIS — R0609 Other forms of dyspnea: Secondary | ICD-10-CM | POA: Insufficient documentation

## 2013-04-10 NOTE — Patient Instructions (Signed)
Your physician has recommended that you have a pulmonary function test. Pulmonary Function Tests are a group of tests that measure how well air moves in and out of your lungs.  MARCH 26TH 10 AM  We will contact you in 3 months to schedule your next appointment.

## 2013-04-10 NOTE — Progress Notes (Signed)
Patient ID: HAYA HEMLER, female   DOB: [birth year], 80 y.o.   MRN: [id number]  HPI: Ms. Norment is a delightful 80 year old woman with a history of syncope in the setting of atrial fibrillation c/b tachybrady syndrome s/p pacemaker placement in 2010, chronic dyspnea as well as hypertension and gastroesophageal reflux disease.  2011 Decreased bisprolol due to resting breadycardia and ordered a Myoview to further evaluate her chronic dyspnea. Myoview low risk. Had event monitor last year with PACs but no significant arrhythmias or pauses.   09/2010  LHC = Aortic pressure is 132/74 with a mean of 100 mmHg, left ventricular pressure is 139 with an EDP of 16 mmHg. L Main normal LAD 0% narrowing in the proximal and midvessel. The first diagonal branch has a 40% ostial stenosis. The left circumflex coronary artery has mild irregularities up to 10- 20%. The right coronary artery is a dominant vessel. It has mild wall irregularities less than or equal to 10%.   Follow up:  Reports she still has DOE with minimal exertion. Weight at home typically 198-200 lbs. This week has been down to 194. Denies orthopnea, CP, or edema. Usually takes lasix 20 mg daily. Will increase to 40 daily if swelling.   Labs 09/26/12: K 3.5, creatinine 0.84, proBNP 1265          10/09/12: K 3.5, Cr 0.83, AST 21, ALT 11  ECHO 07/2009: EF 55-60%, mild MR, LA mod dilated, RA mild dilated ECHO 10/14: EF 55-60%, biatrial enlargement, normal RV size/systolic function, PA systolic pressure 39 mmHg.   SH: Lives in [place] with her husband, nonsmoker.   ROS: All systems negative except as listed in HPI, PMH and Problem List.  Past Medical History  Diagnosis Date  . Paroxysmal atrial fibrillation     tachy-brady syndrome  . Hypertension   . Hyperlipidemia   . GERD (gastroesophageal reflux disease)   . Chest pain     with cath 2002 and 2004 showing mild non-obstructive CAD  . Osteoporosis   . Dyspnea     chronic; myoview 11/9. EF 81%  probable soft-tissue attenuation in anterior and lat walls, can't exclude ischemia  . Obesity    Current Outpatient Prescriptions  Medication Sig Dispense Refill  . atorvastatin (LIPITOR) 10 MG tablet Take 1 tablet (10 mg total) by mouth daily.  30 tablet  6  . bisoprolol (ZEBETA) 10 MG tablet TAKE 1 TABLET BY MOUTH TWICE DAILY.  60 tablet  1  . diltiazem (CARDIZEM CD) 120 MG 24 hr capsule Take 120 mg by mouth daily.       . fish oil-omega-3 fatty acids 1000 MG capsule Take 2 g by mouth as needed.       . furosemide (LASIX) 20 MG tablet Take 1 tablet (20 mg total) by mouth daily.  30 tablet  6  . isosorbide mononitrate (IMDUR) 30 MG 24 hr tablet TAKE 1 TABLET DAILY.  90 tablet  1  . omeprazole (PRILOSEC) 20 MG capsule Take 20 mg by mouth daily.        . potassium chloride SA (K-DUR,KLOR-CON) 20 MEQ tablet Take 1 tablet (20 mEq total) by mouth daily.  30 tablet  6  . rOPINIRole (REQUIP) 0.5 MG tablet Take 0.5 mg by mouth at bedtime.      [name] warfarin (COUMADIN) 3 MG tablet Take 3 mg by mouth daily. On Sunday, Tuesday, Thursday and Saturdays      . warfarin (COUMADIN) 4 MG tablet Take 4 mg by mouth  every Monday, Wednesday, and Friday.       No current facility-administered medications for this encounter.   Filed Vitals:   04/10/13 1415  BP: 124/76  Pulse: 80  Weight: 199 lb 8 oz (90.493 kg)  SpO2: 95%  Last weight: 200 lbs  PHYSICAL EXAM: General:  Elderly, chronically ill appearing. +diffuse  tremor. No acute distress. With grandaughter HEENT: normal;  Neck: supple. JVD 8. Carotids 2+ bilat; no bruits. No lymphadenopathy or thryomegaly appreciated. Cor: PMI nondisplaced. Irregular rate & rhythm. No rubs, gallops, murmur. Back + scoliosis/kyphosis. Lungs: clear Abdomen: soft, nontender,  Mildly distended.  Good bowel sounds. Extremities: no cyanosis, clubbing, rash, 1-2+ ankle edema R>L Neuro: alert & orientedx3, cranial nerves grossly intact. moves all 4 extremities w/o  difficulty  ASSESSMENT & PLAN:  1) Chronic Diastolic HF, EF 55-60% - Volume status slightly up today. Will have her take lasix 40 mg today and whenever fluid is up. Reinforced need for daily weights and reviewed use of sliding scale diuretics. 2) Chronic A-fib - Rate controlled. Will continue diltiazem and coumadin.   3) Dyspnea - Continues to struggle with chronic dyspnea. Volume status not too bad will check PFTs. Will get home PT assessment.    Holly Bodily 04/10/2013

## 2013-04-10 NOTE — Addendum Note (Signed)
Encounter addended by: Noralee Space, RN on: 04/10/2013  4:14 PM<BR>     Documentation filed: Orders

## 2013-04-10 NOTE — Addendum Note (Signed)
Encounter addended by: Noralee Space, RN on: 04/10/2013  3:51 PM<BR>     Documentation filed: Visit Diagnoses, Patient Instructions Section, Orders

## 2013-04-24 ENCOUNTER — Telehealth (HOSPITAL_COMMUNITY): Payer: Self-pay | Admitting: Cardiology

## 2013-04-24 NOTE — Telephone Encounter (Signed)
Pt is scheduled for PFT (04/25/13) cpt code 95188 dx 427.31, 428.32 Per [facility] ref call # [phone], pre cert will need to be started with pts PCP [facility] [phone] PCP will need to call [phone] to begin process   Spoke with Dr.Hodges' office and they are unable to complete pre cert/referral for this pt as it has been quite sometime since she has been seen in the office and their office can perform PFT's Pt will need an office visit  Spoke with pt and she states she is scheduled to see provider in the future, advised her to contact office to see if they are willing to complete pre cert as long as she keeps her follow up Pt will follow up with office and call back if she needs anything else from me.

## 2013-04-25 ENCOUNTER — Inpatient Hospital Stay (HOSPITAL_COMMUNITY): Admission: RE | Admit: 2013-04-25 | Payer: Medicare HMO | Source: Ambulatory Visit

## 2013-05-18 ENCOUNTER — Other Ambulatory Visit: Payer: Self-pay | Admitting: Internal Medicine

## 2013-06-06 ENCOUNTER — Encounter: Payer: Medicare HMO | Admitting: *Deleted

## 2013-06-06 ENCOUNTER — Telehealth: Payer: Self-pay | Admitting: Cardiology

## 2013-06-06 NOTE — Telephone Encounter (Signed)
Called pt to remind pt of remote transmission that needed to be completed. Pt stated that she did not know how to work the machine. I afford to walk pt through the steps but pt refused. She said she was dizzy and light headed and was unable to get up from her chair but she would get a family member to come today and help her send the transmission.

## 2013-07-03 ENCOUNTER — Telehealth: Payer: Self-pay | Admitting: Cardiology

## 2013-07-03 NOTE — Telephone Encounter (Signed)
Attempted to call pt x1. No answer

## 2013-07-03 NOTE — Telephone Encounter (Signed)
Message copied by Melvern Sample on Wed Jul 03, 2013 12:38 PM ------      Message from: Gypsy Balsam K      Created: Wed Jul 03, 2013 10:12 AM       Will you call and follow up with her?  If she still can't send a transmission, then she should come in for device clinic visit.  ------

## 2013-07-31 DIAGNOSIS — I639 Cerebral infarction, unspecified: Secondary | ICD-10-CM

## 2013-07-31 HISTORY — DX: Cerebral infarction, unspecified: I63.9

## 2013-08-15 ENCOUNTER — Encounter (HOSPITAL_COMMUNITY): Payer: Medicare HMO

## 2013-08-21 ENCOUNTER — Emergency Department (HOSPITAL_COMMUNITY): Payer: Medicare HMO

## 2013-08-21 ENCOUNTER — Inpatient Hospital Stay (HOSPITAL_COMMUNITY): Payer: Medicare HMO

## 2013-08-21 ENCOUNTER — Inpatient Hospital Stay (HOSPITAL_COMMUNITY)
Admission: EM | Admit: 2013-08-21 | Discharge: 2013-08-25 | DRG: 065 | Disposition: A | Discharge status: Skilled Nursing Facility | Payer: Medicare HMO | Attending: Internal Medicine | Admitting: Internal Medicine

## 2013-08-21 DIAGNOSIS — I6340 Cerebral infarction due to embolism of unspecified cerebral artery: Secondary | ICD-10-CM | POA: Diagnosis not present

## 2013-08-21 DIAGNOSIS — M810 Age-related osteoporosis without current pathological fracture: Secondary | ICD-10-CM | POA: Diagnosis present

## 2013-08-21 DIAGNOSIS — I4891 Unspecified atrial fibrillation: Secondary | ICD-10-CM | POA: Diagnosis present

## 2013-08-21 DIAGNOSIS — Z950 Presence of cardiac pacemaker: Secondary | ICD-10-CM

## 2013-08-21 DIAGNOSIS — I639 Cerebral infarction, unspecified: Secondary | ICD-10-CM | POA: Diagnosis present

## 2013-08-21 DIAGNOSIS — K219 Gastro-esophageal reflux disease without esophagitis: Secondary | ICD-10-CM | POA: Diagnosis present

## 2013-08-21 DIAGNOSIS — Z45018 Encounter for adjustment and management of other part of cardiac pacemaker: Secondary | ICD-10-CM

## 2013-08-21 DIAGNOSIS — E785 Hyperlipidemia, unspecified: Secondary | ICD-10-CM | POA: Diagnosis present

## 2013-08-21 DIAGNOSIS — I482 Chronic atrial fibrillation, unspecified: Secondary | ICD-10-CM

## 2013-08-21 DIAGNOSIS — I10 Essential (primary) hypertension: Secondary | ICD-10-CM | POA: Diagnosis present

## 2013-08-21 DIAGNOSIS — Z6841 Body Mass Index (BMI) 40.0 and over, adult: Secondary | ICD-10-CM

## 2013-08-21 DIAGNOSIS — R29898 Other symptoms and signs involving the musculoskeletal system: Secondary | ICD-10-CM | POA: Diagnosis not present

## 2013-08-21 DIAGNOSIS — Z79899 Other long term (current) drug therapy: Secondary | ICD-10-CM

## 2013-08-21 DIAGNOSIS — I5032 Chronic diastolic (congestive) heart failure: Secondary | ICD-10-CM

## 2013-08-21 DIAGNOSIS — I6350 Cerebral infarction due to unspecified occlusion or stenosis of unspecified cerebral artery: Secondary | ICD-10-CM

## 2013-08-21 DIAGNOSIS — I480 Paroxysmal atrial fibrillation: Secondary | ICD-10-CM

## 2013-08-21 DIAGNOSIS — Z7901 Long term (current) use of anticoagulants: Secondary | ICD-10-CM

## 2013-08-21 DIAGNOSIS — R791 Abnormal coagulation profile: Secondary | ICD-10-CM | POA: Diagnosis present

## 2013-08-21 DIAGNOSIS — G458 Other transient cerebral ischemic attacks and related syndromes: Secondary | ICD-10-CM

## 2013-08-21 DIAGNOSIS — G459 Transient cerebral ischemic attack, unspecified: Secondary | ICD-10-CM | POA: Diagnosis present

## 2013-08-21 DIAGNOSIS — R0602 Shortness of breath: Secondary | ICD-10-CM

## 2013-08-21 DIAGNOSIS — I495 Sick sinus syndrome: Secondary | ICD-10-CM

## 2013-08-21 DIAGNOSIS — I6359 Cerebral infarction due to unspecified occlusion or stenosis of other cerebral artery: Secondary | ICD-10-CM

## 2013-08-21 LAB — URINALYSIS, ROUTINE W REFLEX MICROSCOPIC
BILIRUBIN URINE: NEGATIVE
Glucose, UA: NEGATIVE mg/dL
KETONES UR: NEGATIVE mg/dL
Leukocytes, UA: NEGATIVE
NITRITE: NEGATIVE
Protein, ur: NEGATIVE mg/dL
Specific Gravity, Urine: 1.042 — ABNORMAL HIGH (ref 1.005–1.030)
Urobilinogen, UA: 0.2 mg/dL (ref 0.0–1.0)
pH: 7 (ref 5.0–8.0)

## 2013-08-21 LAB — DIFFERENTIAL
BASOS ABS: 0.1 10*3/uL (ref 0.0–0.1)
Basophils Relative: 1 % (ref 0–1)
EOS PCT: 1 % (ref 0–5)
Eosinophils Absolute: 0.1 10*3/uL (ref 0.0–0.7)
Lymphocytes Relative: 44 % (ref 12–46)
Lymphs Abs: 3.8 10*3/uL (ref 0.7–4.0)
MONO ABS: 0.9 10*3/uL (ref 0.1–1.0)
Monocytes Relative: 11 % (ref 3–12)
Neutro Abs: 3.8 10*3/uL (ref 1.7–7.7)
Neutrophils Relative %: 43 % (ref 43–77)

## 2013-08-21 LAB — PROTIME-INR
INR: 1.82 — ABNORMAL HIGH (ref 0.00–1.49)
INR: 1.91 — ABNORMAL HIGH (ref 0.00–1.49)
Prothrombin Time: 21.1 seconds — ABNORMAL HIGH (ref 11.6–15.2)
Prothrombin Time: 21.9 seconds — ABNORMAL HIGH (ref 11.6–15.2)

## 2013-08-21 LAB — COMPREHENSIVE METABOLIC PANEL
ALT: 11 U/L (ref 0–35)
AST: 21 U/L (ref 0–37)
Albumin: 3.4 g/dL — ABNORMAL LOW (ref 3.5–5.2)
Alkaline Phosphatase: 130 U/L — ABNORMAL HIGH (ref 39–117)
Anion gap: 15 (ref 5–15)
BUN: 13 mg/dL (ref 6–23)
CALCIUM: 9.1 mg/dL (ref 8.4–10.5)
CO2: 22 mEq/L (ref 19–32)
CREATININE: 0.87 mg/dL (ref 0.50–1.10)
Chloride: 103 mEq/L (ref 96–112)
GFR, EST AFRICAN AMERICAN: 67 mL/min — AB (ref >90)
GFR, EST NON AFRICAN AMERICAN: 58 mL/min — AB (ref >90)
Glucose, Bld: 100 mg/dL — ABNORMAL HIGH (ref 70–99)
Potassium: 4.2 mEq/L (ref 3.7–5.3)
Sodium: 140 mEq/L (ref 137–147)
TOTAL PROTEIN: 7.3 g/dL (ref 6.0–8.3)
Total Bilirubin: 0.4 mg/dL (ref 0.3–1.2)

## 2013-08-21 LAB — CBC
HEMATOCRIT: 43.2 % (ref 36.0–46.0)
Hemoglobin: 14.3 g/dL (ref 12.0–15.0)
MCH: 31.3 pg (ref 26.0–34.0)
MCHC: 33.1 g/dL (ref 30.0–36.0)
MCV: 94.5 fL (ref 78.0–100.0)
PLATELETS: 335 10*3/uL (ref 150–400)
RBC: 4.57 MIL/uL (ref 3.87–5.11)
RDW: 14.2 % (ref 11.5–15.5)
WBC: 8.7 10*3/uL (ref 4.0–10.5)

## 2013-08-21 LAB — I-STAT TROPONIN, ED: Troponin i, poc: 0 ng/mL (ref 0.00–0.08)

## 2013-08-21 LAB — RAPID URINE DRUG SCREEN, HOSP PERFORMED
Amphetamines: NOT DETECTED
BARBITURATES: NOT DETECTED
BENZODIAZEPINES: NOT DETECTED
Cocaine: NOT DETECTED
OPIATES: NOT DETECTED
Tetrahydrocannabinol: NOT DETECTED

## 2013-08-21 LAB — URINE MICROSCOPIC-ADD ON

## 2013-08-21 LAB — I-STAT CHEM 8, ED
BUN: 13 mg/dL (ref 6–23)
CREATININE: 0.9 mg/dL (ref 0.50–1.10)
Calcium, Ion: 1.13 mmol/L (ref 1.13–1.30)
Chloride: 105 mEq/L (ref 96–112)
Glucose, Bld: 103 mg/dL — ABNORMAL HIGH (ref 70–99)
HEMATOCRIT: 46 % (ref 36.0–46.0)
HEMOGLOBIN: 15.6 g/dL — AB (ref 12.0–15.0)
Potassium: 4 mEq/L (ref 3.7–5.3)
SODIUM: 141 meq/L (ref 137–147)
TCO2: 24 mmol/L (ref 0–100)

## 2013-08-21 LAB — APTT: aPTT: 37 seconds (ref 24–37)

## 2013-08-21 LAB — ETHANOL

## 2013-08-21 LAB — CBG MONITORING, ED: GLUCOSE-CAPILLARY: 93 mg/dL (ref 70–99)

## 2013-08-21 MED ORDER — DILTIAZEM HCL ER COATED BEADS 120 MG PO CP24
120.0000 mg | ORAL_CAPSULE | Freq: Every day | ORAL | Status: DC
  Filled 2013-08-21: qty 1

## 2013-08-21 MED ORDER — ROPINIROLE HCL 1 MG PO TABS
0.5000 mg | ORAL_TABLET | Freq: Every day | ORAL | Status: DC
  Administered 2013-08-21 – 2013-08-24 (×4): 0.5 mg via ORAL
  Filled 2013-08-21 (×4): qty 0.5

## 2013-08-21 MED ORDER — ATORVASTATIN CALCIUM 10 MG PO TABS
10.0000 mg | ORAL_TABLET | Freq: Every day | ORAL | Status: DC
  Administered 2013-08-23: 10 mg via ORAL
  Filled 2013-08-21 (×2): qty 1

## 2013-08-21 MED ORDER — HYDRALAZINE HCL 20 MG/ML IJ SOLN
5.0000 mg | Freq: Four times a day (QID) | INTRAMUSCULAR | Status: DC | PRN
  Administered 2013-08-22: 5 mg via INTRAVENOUS
  Filled 2013-08-21 (×2): qty 1

## 2013-08-21 MED ORDER — BISOPROLOL FUMARATE 5 MG PO TABS
5.0000 mg | ORAL_TABLET | Freq: Every day | ORAL | Status: DC
  Administered 2013-08-22 – 2013-08-25 (×4): 5 mg via ORAL
  Filled 2013-08-21 (×4): qty 1

## 2013-08-21 MED ORDER — ISOSORBIDE MONONITRATE ER 30 MG PO TB24
30.0000 mg | ORAL_TABLET | Freq: Every day | ORAL | Status: DC
  Administered 2013-08-22 – 2013-08-25 (×4): 30 mg via ORAL
  Filled 2013-08-21 (×4): qty 1

## 2013-08-21 MED ORDER — PANTOPRAZOLE SODIUM 40 MG PO TBEC
40.0000 mg | DELAYED_RELEASE_TABLET | Freq: Every day | ORAL | Status: DC
  Administered 2013-08-22 – 2013-08-25 (×4): 40 mg via ORAL
  Filled 2013-08-21 (×4): qty 1

## 2013-08-21 MED ORDER — SODIUM CHLORIDE 0.9 % IV BOLUS (SEPSIS)
1000.0000 mL | Freq: Once | INTRAVENOUS | Status: AC
  Administered 2013-08-21: 1000 mL via INTRAVENOUS

## 2013-08-21 MED ORDER — WARFARIN - PHARMACIST DOSING INPATIENT
Freq: Every day | Status: DC
  Administered 2013-08-23 – 2013-08-24 (×2)

## 2013-08-21 MED ORDER — WARFARIN SODIUM 4 MG PO TABS
4.0000 mg | ORAL_TABLET | Freq: Once | ORAL | Status: AC
  Administered 2013-08-21: 4 mg via ORAL
  Filled 2013-08-21 (×4): qty 1

## 2013-08-21 MED ORDER — STROKE: EARLY STAGES OF RECOVERY BOOK
Freq: Once | Status: DC
  Filled 2013-08-21: qty 1

## 2013-08-21 MED ORDER — SENNOSIDES-DOCUSATE SODIUM 8.6-50 MG PO TABS: 1.0000 | ORAL_TABLET | Freq: Every evening | ORAL | Status: DC | PRN

## 2013-08-21 MED ORDER — IOHEXOL 350 MG/ML SOLN
80.0000 mL | Freq: Once | INTRAVENOUS | Status: AC | PRN
  Administered 2013-08-21: 80 mL via INTRAVENOUS

## 2013-08-21 NOTE — Progress Notes (Addendum)
Paged Dr. Blake Divine regarding BP readings.

## 2013-08-21 NOTE — ED Notes (Signed)
Pt noted to have weak left grip, leaning to left and left leg weakness. Md Ward at bedside. Code stroke actived. Pt  Is AO x4, no slurred speech. Family at bedside.

## 2013-08-21 NOTE — Code Documentation (Signed)
80yo female arriving to [facility] via [facility].  On arrival, RN assessed left sided weakness and Code Stroke was called.  Patient was riding in the car when she said her left hand did not feel like her hand.  She got out of the car and noticed that her leg also felt different and she could not walk.  Her family reports some confusion as well while in the car.  Code stroke activated on arrival.  Labs sent and patient taken to CT.  NIHSS 7, see documentation for details and times.  Patient is taking Coumadin, INR 1.82.  Patient is contraindicated for treatment with tPA d/t INR.  Patient taken to CTA, patient is not a candidate for IR per Dr. Roseanne Reno.  Family updated on plan of care by Dr. Roseanne Reno.  Bedside handoff with ED RN Brittney.

## 2013-08-21 NOTE — H&P (Signed)
[facility] History and Physical  [name] ZRA:[id number] DOB: 09-13-1923 DOA: 08/21/2013  Referring physician: edp PCP: Charlott Rakes, MD   Chief Complaint: left hand weakness and left leg numbness and weakness.   HPI: [name] is a 80 y.o. female with prior h/o atrial fibrillation, hypertension, s/p pacemaker, on coumadin for chronic anticoagulation, comes in for persistent left lower extremity and left hand numbness weakness since 12 this afternoon. She was brought to the ED as code stroke. Initial CT head did not show acute stroke. Since she had a pacemaker, MRI of the brain could not be done. So she underwent a CT angiogram of the head and neck. Neurology was consulted. And she was referred to [facility] service for admission for evaluation of stroke .   Review of Systems:  See HPI otherwise negative.   Past Medical History  Diagnosis Date  . Paroxysmal atrial fibrillation     tachy-brady syndrome  . Hypertension   . Hyperlipidemia   . GERD (gastroesophageal reflux disease)   . Chest pain     with cath 2002 and 2004 showing mild non-obstructive CAD  . Osteoporosis   . Dyspnea     chronic; myoview 11/9. EF 81% probable soft-tissue attenuation in anterior and lat walls, can't exclude ischemia  . Obesity    Past Surgical History  Procedure Laterality Date  . Cardiac catheterization    . Breast lumpectomy      right  . Total abdominal hysterectomy w/ bilateral salpingoophorectomy  1974  . Colectomy  1991  . Pacemaker insertion      [employer]   Social History:  reports that she has never smoked. She does not have any smokeless tobacco history on file. She reports that she does not drink alcohol or use illicit drugs.  Allergies  Allergen Reactions  . Morphine And Related Other (See Comments)    hallucinations    Family History  Problem Relation Age of Onset  . Cancer Other   . Heart disease Other   . Hypertension Other      Prior to Admission  medications   Medication Sig Start Date End Date Taking? Authorizing Provider  bisoprolol (ZEBETA) 10 MG tablet Take 10 mg by mouth 2 (two) times daily.   Yes Historical Provider, MD  calcium carbonate (TUMS) 500 MG chewable tablet Chew 1 tablet by mouth daily as needed for indigestion or heartburn.   Yes Historical Provider, MD  diltiazem (CARDIZEM CD) 120 MG 24 hr capsule Take 120 mg by mouth 2 (two) times daily.  03/05/13  Yes Duke Salvia, MD  furosemide (LASIX) 20 MG tablet Take 40 mg by mouth daily.   Yes Historical Provider, MD  isosorbide mononitrate (IMDUR) 30 MG 24 hr tablet Take 30 mg by mouth daily.   Yes Historical Provider, MD  omeprazole (PRILOSEC) 20 MG capsule Take 20 mg by mouth daily.     Yes Historical Provider, MD  potassium chloride SA (K-DUR,KLOR-CON) 20 MEQ tablet Take 1 tablet (20 mEq total) by mouth daily. 12/03/12  Yes Aundria Rud, NP  rOPINIRole (REQUIP) 0.5 MG tablet Take 0.5 mg by mouth at bedtime.   Yes Historical Provider, MD  warfarin (COUMADIN) 3 MG tablet Take 3 mg by mouth daily.    Yes Historical Provider, MD   Physical Exam: Filed Vitals:   08/21/13 1559 08/21/13 1616 08/21/13 1818 08/21/13 1829  BP: 138/116 148/85  162/108  Pulse: 54  68   Temp: 98.1 F (36.7 C)  99.1 F (37.3 C)   TempSrc: Oral  Oral   Resp:   18   Height: 5' (1.524 m)     Weight: 93 kg (205 lb 0.4 oz)     SpO2: 96%  93%     Wt Readings from Last 3 Encounters:  08/21/13 93 kg (205 lb 0.4 oz)  04/10/13 90.493 kg (199 lb 8 oz)  03/05/13 90.946 kg (200 lb 8 oz)    General:  Appears calm and comfortable Eyes: PERRL, normal lids, irises & conjunctiva Neck: no LAD, masses or thyromegaly Cardiovascular: irregular RR, . No LE edema. Respiratory: CTA bilaterally, no w/r/r. Normal respiratory effort. Abdomen: soft, ntnd Skin: no rash or induration seen on limited exam Musculoskeletal: grossly normal tone BUE/BLE Neurologic: alert and oriented to place and person and time. No  facial droop. Speech normal. Left arm and left leg weakness. With numbess of the of left hand and left foot.           Labs on Admission:  Basic Metabolic Panel:  Recent Labs Lab 08/21/13 1320 08/21/13 1342  NA 140 141  K 4.2 4.0  CL 103 105  CO2 22  --   GLUCOSE 100* 103*  BUN 13 13  CREATININE 0.87 0.90  CALCIUM 9.1  --    Liver Function Tests:  Recent Labs Lab 08/21/13 1320  AST 21  ALT 11  ALKPHOS 130*  BILITOT 0.4  PROT 7.3  ALBUMIN 3.4*   No results found for this basename: LIPASE, AMYLASE,  in the last 168 hours No results found for this basename: AMMONIA,  in the last 168 hours CBC:  Recent Labs Lab 08/21/13 1320 08/21/13 1342  WBC 8.7  --   NEUTROABS 3.8  --   HGB 14.3 15.6*  HCT 43.2 46.0  MCV 94.5  --   PLT 335  --    Cardiac Enzymes: No results found for this basename: CKTOTAL, CKMB, CKMBINDEX, TROPONINI,  in the last 168 hours  BNP (last 3 results)  Recent Labs  09/26/12 1002  PROBNP 1265.0*   CBG:  Recent Labs Lab 08/21/13 1349  GLUCAP 93    Radiological Exams on Admission: Ct Angio Head W/cm &/or Wo Cm  08/21/2013   CLINICAL DATA:  Weakness of the left side affecting the arm and leg  EXAM: CT ANGIOGRAPHY HEAD AND NECK  TECHNIQUE: Multidetector CT imaging of the head and neck was performed using the standard protocol during bolus administration of intravenous contrast. Multiplanar CT image reconstructions and MIPs were obtained to evaluate the vascular anatomy. Carotid stenosis measurements (when applicable) are obtained utilizing NASCET criteria, using the distal internal carotid diameter as the denominator.  CONTRAST:  80mL OMNIPAQUE IOHEXOL 350 MG/ML SOLN  COMPARISON:  Head CT same day  FINDINGS: CTA HEAD FINDINGS  Both internal carotid arteries show calcification in the siphon regions but there is no evidence of flow-limiting stenosis. The supra clinoid internal carotid arteries are widely patent. The anterior and middle cerebral  arteries are patent without proximal stenosis come aneurysm or vascular malformation. No missing branches are demonstrated. The  Both vertebral arteries are patent through the foramen magnum to the basilar. No basilar stenosis. Posterior circulation branch vessels appear normal.  No intracranial venous pathology. No intracranial hemorrhage or evidence of acute infarction. There is chronic calcification in the left cerebellum which appears to be associated with some abnormal vessels. This probably represents a developmental venous anomaly.  Review of the MIP images confirms the above findings.  CTA NECK FINDINGS  Lung apices are clear.  No superior mediastinal lesion.  The aortic arch shows atherosclerotic calcification but no aneurysm or dissection. The branching pattern of the brachiocephalic vessels from the arch is normal. No origin stenoses.  The right common carotid artery is extremely tortuous but is widely patent to the bifurcation. The bifurcation is widely patent without stenosis or irregularity. The right internal carotid artery is smooth and of normal caliber.  The left common carotid artery is markedly tortuous but widely patent to the bifurcation. The carotid bifurcation does not show any stenosis or irregularity. The cervical internal carotid artery is tortuous but widely patent.  The right vertebral artery origin is widely patent. The left vertebral artery origin is probably widely patent, but not optimally seen because of venous reflux. Right vertebral artery is tortuous but widely patent beyond that. The left vertebral artery is tortuous. At the C2 and C3 level, there is either a fenestration or chronic dissection at the vertebral artery. There is no stenosis. The vessel appears widely patent beyond that.  Review of the MIP images confirms the above findings.  IMPRESSION: No intracranial occlusion or stenosis. Ordinary atherosclerotic calcification in the carotid siphon regions. Developmental venous  anomaly with calcification in the left cerebellum.  Tortuous vessels in the neck suggesting a history of chronic hypertension. No carotid bifurcation stenosis or irregularity.  Linear defect in the left vertebral artery at the C2 and C3 level. This could represent a chronic fenestration or a chronic dissection. I suspected is probably incidental.   Electronically Signed   By: Paulina Fusi M.D.   On: 08/21/2013 15:01   Ct Head Wo Contrast  08/21/2013   CLINICAL DATA:  Abnormal sensation and weakness on left side involving the arm and leg.  EXAM: CT HEAD WITHOUT CONTRAST  TECHNIQUE: Contiguous axial images were obtained from the base of the skull through the vertex without intravenous contrast.  COMPARISON:  04/22/2013  FINDINGS: Ventricles are normal in configuration. There is ventricular enlargement, greater than sulcal enlargement, consistent with predominantly central atrophy. This is stable. There is no hydrocephalus.  No parenchymal masses or mass effect. There is no evidence of a recent cortical infarct. Mild white matter hypoattenuation is noted most consistent with chronic microvascular ischemic change. Stable calcification in the left cerebellum.  No extra-axial masses or abnormal fluid collections.  There is no intracranial hemorrhage.  Visualized sinuses and mastoid air cells are clear.  IMPRESSION: 1. No acute intracranial abnormalities.  No intracranial hemorrhage. 2. Stable atrophy and mild chronic microvascular ischemic change. These results were called by telephone at the time of interpretation on 08/21/2013 at 1:46 pm to Dr. Roseanne Reno , who verbally acknowledged these results.   Electronically Signed   By: Amie Portland M.D.   On: 08/21/2013 13:47   Ct Angio Neck W/cm &/or Wo/cm  08/21/2013   CLINICAL DATA:  Weakness of the left side affecting the arm and leg  EXAM: CT ANGIOGRAPHY HEAD AND NECK  TECHNIQUE: Multidetector CT imaging of the head and neck was performed using the standard protocol  during bolus administration of intravenous contrast. Multiplanar CT image reconstructions and MIPs were obtained to evaluate the vascular anatomy. Carotid stenosis measurements (when applicable) are obtained utilizing NASCET criteria, using the distal internal carotid diameter as the denominator.  CONTRAST:  51mL OMNIPAQUE IOHEXOL 350 MG/ML SOLN  COMPARISON:  Head CT same day  FINDINGS: CTA HEAD FINDINGS  Both internal carotid arteries show calcification in the siphon regions but  there is no evidence of flow-limiting stenosis. The supra clinoid internal carotid arteries are widely patent. The anterior and middle cerebral arteries are patent without proximal stenosis come aneurysm or vascular malformation. No missing branches are demonstrated. The  Both vertebral arteries are patent through the foramen magnum to the basilar. No basilar stenosis. Posterior circulation branch vessels appear normal.  No intracranial venous pathology. No intracranial hemorrhage or evidence of acute infarction. There is chronic calcification in the left cerebellum which appears to be associated with some abnormal vessels. This probably represents a developmental venous anomaly.  Review of the MIP images confirms the above findings.  CTA NECK FINDINGS  Lung apices are clear.  No superior mediastinal lesion.  The aortic arch shows atherosclerotic calcification but no aneurysm or dissection. The branching pattern of the brachiocephalic vessels from the arch is normal. No origin stenoses.  The right common carotid artery is extremely tortuous but is widely patent to the bifurcation. The bifurcation is widely patent without stenosis or irregularity. The right internal carotid artery is smooth and of normal caliber.  The left common carotid artery is markedly tortuous but widely patent to the bifurcation. The carotid bifurcation does not show any stenosis or irregularity. The cervical internal carotid artery is tortuous but widely patent.  The  right vertebral artery origin is widely patent. The left vertebral artery origin is probably widely patent, but not optimally seen because of venous reflux. Right vertebral artery is tortuous but widely patent beyond that. The left vertebral artery is tortuous. At the C2 and C3 level, there is either a fenestration or chronic dissection at the vertebral artery. There is no stenosis. The vessel appears widely patent beyond that.  Review of the MIP images confirms the above findings.  IMPRESSION: No intracranial occlusion or stenosis. Ordinary atherosclerotic calcification in the carotid siphon regions. 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  Tortuous vessels in the neck suggesting a history of chronic hypertension. No carotid bifurcation stenosis or irregularity.  Linear defect in the left vertebral artery at the C2 and C3 level. This could represent a chronic fenestration or a chronic dissection. I suspected is probably incidental.   Electronically Signed   By: Paulina Fusi M.D.   On: 08/21/2013 15:01    EKG: afib with rate 77/min  Assessment/Plan Active Problems:   CVA (cerebral infarction)   Left hand numbness and left leg weakness with numbness/ possibly sub cortical CVA: Admit to telemetry.  MRI could not be done because of the pacemaker.  Stroke work up in progress.  Resume coumadin.  PT/OT EVAL pending.   2. Hypertension:  Please allow permissive hypertension.   3. Atrial fibrillation: Rate controlled.  Resume coumadin as per pharmacy.   4. DVT prophylaxis.      Code Status: full code DVT Prophylaxis: coumadin Family Communication: discussed with daughters and the rest of the family at bedside.  Disposition Plan: pending PT evaluation.   Time spent:65 minutes   [facility] Pager [phone]  **Disclaimer: This note may have been dictated with voice recognition software. Similar sounding words can inadvertently be  transcribed and this note may contain transcription errors which may not have been corrected upon publication of note.**

## 2013-08-21 NOTE — Consult Note (Signed)
Referring Physician: Dr. Elesa Massed    Chief Complaint: Acute onset of left-sided weakness and numbness.  HPI: [name] is an 80 y.o. female history of atrial fibrillation on Coumadin, hypertension and hyperlipidemia who was brought to the emergency room following acute onset of left-sided weakness and numbness which started about 11:30 AM today. She has no previous history of stroke nor TIA. She had no changes in speech and no noticeable changes in mental status. CT scan of her head showed no acute intracranial abnormality. Subsequent CTA showed no intracranial large vessel occlusion. INR was 1.82. NIH stroke score was 7.  LSN: 11:30 on 08/21/2013 tPA Given: No: INR 1.82 MRankin: 2  Past Medical History  Diagnosis Date  . Paroxysmal atrial fibrillation     tachy-brady syndrome  . Hypertension   . Hyperlipidemia   . GERD (gastroesophageal reflux disease)   . Chest pain     with cath 2002 and 2004 showing mild non-obstructive CAD  . Osteoporosis   . Dyspnea     chronic; myoview 11/9. EF 81% probable soft-tissue attenuation in anterior and lat walls, can't exclude ischemia  . Obesity     Family History  Problem Relation Age of Onset  . Cancer Other   . Heart disease Other   . Hypertension Other      Medications: I have reviewed the patient's current medications.  ROS: History obtained from the patient and patient's daughter  General ROS: negative for - chills, fatigue, fever, night sweats, weight gain or weight loss Psychological ROS: negative for - behavioral disorder, hallucinations, memory difficulties, mood swings or suicidal ideation Ophthalmic ROS: negative for - blurry vision, double vision, eye pain or loss of vision ENT ROS: negative for - epistaxis, nasal discharge, oral lesions, sore throat, tinnitus or vertigo Allergy and Immunology ROS: negative for - hives or itchy/watery eyes Hematological and Lymphatic ROS: negative for - bleeding problems, bruising or swollen  lymph nodes Endocrine ROS: negative for - galactorrhea, hair pattern changes, polydipsia/polyuria or temperature intolerance Respiratory ROS: negative for - cough, hemoptysis, shortness of breath or wheezing Cardiovascular ROS: negative for - chest pain, dyspnea on exertion, edema or irregular heartbeat Gastrointestinal ROS: negative for - abdominal pain, diarrhea, hematemesis, nausea/vomiting or stool incontinence Genito-Urinary ROS: negative for - dysuria, hematuria, incontinence or urinary frequency/urgency Musculoskeletal ROS: negative for - joint swelling or muscular weakness Neurological ROS: as noted in HPI Dermatological ROS: negative for rash and skin lesion changes  Physical Examination: Blood pressure 145/92, pulse 74, temperature 98.4 F (36.9 C), temperature source Oral, resp. rate 15, SpO2 97.00%.  Neurologic Examination: Mental Status: Alert, oriented, thought content appropriate.  Speech fluent without evidence of aphasia. Able to follow commands without difficulty. Cranial Nerves: II-Visual fields were normal. III/IV/VI-Pupils were equal and reacted. Extraocular movements were full and conjugate.    V/VII-no facial numbness; mild flattening of left nasolabial fold. VIII-normal. X-normal speech. Motor: Moderate proximal and distal weakness of left upper extremity and moderate proximal weakness of left lower extremity; normal strength of right extremities; normal muscle tone throughout. Sensory: Reduced perception of tactile sensation as well as slight neglect of left side stimulation compared to the right.. Deep Tendon Reflexes: Absent bilaterally. Plantars: Mute bilaterally Cerebellar: Impaired with left upper extremity, commensurate with weakness.  Ct Head Wo Contrast  08/21/2013   CLINICAL DATA:  Abnormal sensation and weakness on left side involving the arm and leg.  EXAM: CT HEAD WITHOUT CONTRAST  TECHNIQUE: Contiguous axial images were obtained from the base  of the  skull through the vertex without intravenous contrast.  COMPARISON:  04/22/2013  FINDINGS: Ventricles are normal in configuration. There is ventricular enlargement, greater than sulcal enlargement, consistent with predominantly central atrophy. This is stable. There is no hydrocephalus.  No parenchymal masses or mass effect. There is no evidence of a recent cortical infarct. Mild white matter hypoattenuation is noted most consistent with chronic microvascular ischemic change. Stable calcification in the left cerebellum.  No extra-axial masses or abnormal fluid collections.  There is no intracranial hemorrhage.  Visualized sinuses and mastoid air cells are clear.  IMPRESSION: 1. No acute intracranial abnormalities.  No intracranial hemorrhage. 2. Stable atrophy and mild chronic microvascular ischemic change. These results were called by telephone at the time of interpretation on 08/21/2013 at 1:46 pm to Dr. Roseanne Reno , who verbally acknowledged these results.   Electronically Signed   By: Amie Portland M.D.   On: 08/21/2013 13:47    Assessment: 80 y.o. female with multiple risk factors for stroke, including atrial fibrillation and slightly subtherapeutic with Coumadin, presenting with probable right subcortical ischemic cerebral infarction.  Stroke Risk Factors - atrial fibrillation, hyperlipidemia and hypertension  Plan: 1. HgbA1c, fasting lipid panel 2. MRI cannot be obtained as patient has a cardiac pacemaker 3. PT consult, OT consult, Speech consult 4. Echocardiogram 5. Prophylactic therapy-Anticoagulation: Coumadin 6. Risk factor modification 7. Telemetry monitoring   C.R. Roseanne Reno, MD [facility] [phone]  08/21/2013, 2:53 PM

## 2013-08-21 NOTE — Progress Notes (Addendum)
ANTICOAGULATION CONSULT NOTE - Initial Consult  Pharmacy Consult for coumadin Indication: atrial fibrillation and CVA  Allergies  Allergen Reactions  . Morphine And Related Other (See Comments)    hallucinations    Patient Measurements:   Heparin Dosing Weight:   Vital Signs: Temp: 98.4 F (36.9 C) (07/22 1539) Temp src: Oral (07/22 1359) BP: 143/70 mmHg (07/22 1530) Pulse Rate: 74 (07/22 1530)  Labs:  Recent Labs  08/21/13 1320 08/21/13 1342  HGB 14.3 15.6*  HCT 43.2 46.0  PLT 335  --   APTT 37  --   LABPROT 21.1*  --   INR 1.82*  --   CREATININE 0.87 0.90    The CrCl is unknown because both a height and weight (above a minimum accepted value) are required for this calculation.   Medical History: Past Medical History  Diagnosis Date  . Paroxysmal atrial fibrillation     tachy-brady syndrome  . Hypertension   . Hyperlipidemia   . GERD (gastroesophageal reflux disease)   . Chest pain     with cath 2002 and 2004 showing mild non-obstructive CAD  . Osteoporosis   . Dyspnea     chronic; myoview 11/9. EF 81% probable soft-tissue attenuation in anterior and lat walls, can't exclude ischemia  . Obesity     Medications:  Scheduled:  Infusions:   Assessment: 18 female with afib and CVA will be continued on coumadin.  INR 1.82.  Patient was on coumadin 3mg  po daily prior to admission.  Goal of Therapy:  INR 2-3 Monitor platelets by anticoagulation protocol: Yes   Plan:  1) Coumadin 4mg  po x1 2) INR in am  Kristin Lamagna, Tsz-Yin 08/21/2013,3:55 PM

## 2013-08-21 NOTE — ED Notes (Addendum)
Per [facility]: Pt was driving and at 9562 she started to experience left arm "started to not feel like my arm." Denies numbness, tingling. Pt states she pulled over her left leg also "just didn't feel like my normal leg. " Per family, pt couldn't walk when she got out of car.  Pt denies any numbness, tingling. Per EMS Grips equal, strong. Pt has equal strength bilaterally. No facial droop, slurred speech. BG 110. 142/88.

## 2013-08-21 NOTE — ED Provider Notes (Signed)
TIME SEEN: 1:30 PM  CHIEF COMPLAINT: Code stroke  HPI: Patient is a 80 y.o. F with history of hypertension, hyperlipidemia, paroxysmal atrial fibrillation on Coumadin who presents to the emergency department with complaints of left-sided weakness that started at 1145 while driving with her niece to the grocery store. She states that she has had some chest tightness over the past several days but none currently. No shortness of breath. No headache. No recent head injury. No prior history of stroke. Patient's family reports that her INR has been therapeutic recently but are unable to tell me the exact number.  ROS: See HPI Constitutional: no fever  Eyes: no drainage  ENT: no runny nose   Cardiovascular:  Recent chest pain  Resp: no SOB  GI: no vomiting GU: no dysuria Integumentary: no rash  Allergy: no hives  Musculoskeletal: no leg swelling  Neurological: no slurred speech ROS otherwise negative  PAST MEDICAL HISTORY/PAST SURGICAL HISTORY:  Past Medical History  Diagnosis Date  . Paroxysmal atrial fibrillation     tachy-brady syndrome  . Hypertension   . Hyperlipidemia   . GERD (gastroesophageal reflux disease)   . Chest pain     with cath 2002 and 2004 showing mild non-obstructive CAD  . Osteoporosis   . Dyspnea     chronic; myoview 11/9. EF 81% probable soft-tissue attenuation in anterior and lat walls, can't exclude ischemia  . Obesity     MEDICATIONS:  Prior to Admission medications   Medication Sig Start Date End Date Taking? Authorizing Provider  atorvastatin (LIPITOR) 10 MG tablet Take 1 tablet (10 mg total) by mouth daily. 10/11/12   Aundria Rud, NP  bisoprolol (ZEBETA) 10 MG tablet TAKE 1 TABLET BY MOUTH TWICE DAILY.    Dolores Patty, MD  diltiazem (CARDIZEM CD) 120 MG 24 hr capsule Take 120 mg by mouth daily.  03/05/13   [facility], MD  fish oil-omega-3 fatty acids 1000 MG capsule Take 2 g by mouth as needed.     Historical Provider, MD  furosemide (LASIX)  20 MG tablet Take 1 tablet (20 mg total) by mouth daily. 12/03/12   Aundria Rud, NP  isosorbide mononitrate (IMDUR) 30 MG 24 hr tablet TAKE 1 TABLET ONCE DAILY.    Marinus Maw, MD  omeprazole (PRILOSEC) 20 MG capsule Take 20 mg by mouth daily.      Historical Provider, MD  potassium chloride SA (K-DUR,KLOR-CON) 20 MEQ tablet Take 1 tablet (20 mEq total) by mouth daily. 12/03/12   Aundria Rud, NP  rOPINIRole (REQUIP) 0.5 MG tablet Take 0.5 mg by mouth at bedtime.    Historical Provider, MD  warfarin (COUMADIN) 3 MG tablet Take 3 mg by mouth daily. On Sunday, Tuesday, Thursday and Saturdays    Historical Provider, MD  warfarin (COUMADIN) 4 MG tablet Take 4 mg by mouth every Monday, Wednesday, and Friday.    Historical Provider, MD    ALLERGIES:  Allergies  Allergen Reactions  . Morphine And Related Other (See Comments)    hallucinations    SOCIAL HISTORY:  History  Substance Use Topics  . Smoking status: Never Smoker   . Smokeless tobacco: Not on file  . Alcohol Use: No    FAMILY HISTORY: Family History  Problem Relation Age of Onset  . Cancer Other   . Heart disease Other   . Hypertension Other     EXAM: There were no vitals taken for this visit. CONSTITUTIONAL: Alert and oriented and  responds appropriately to questions. Well-appearing; well-nourished HEAD: Normocephalic EYES: Conjunctivae clear, PERRL ENT: normal nose; no rhinorrhea; moist mucous membranes; pharynx without lesions noted NECK: Supple, no meningismus, no LAD  CARD: RRR; S1 and S2 appreciated; no murmurs, no clicks, no rubs, no gallops RESP: Normal chest excursion without splinting or tachypnea; breath sounds clear and equal bilaterally; no wheezes, no rhonchi, no rales,  ABD/GI: Normal bowel sounds; non-distended; soft, non-tender, no rebound, no guarding BACK:  The back appears normal and is non-tender to palpation, there is no CVA tenderness EXT: Normal ROM in all joints; non-tender to palpation; no  edema; normal capillary refill; no cyanosis    SKIN: Normal color for age and race; warm NEURO: Patient has left-sided upper and lower extremity weakness and pronator drift, almost no grip strength the left upper extremity, sensation to light touch intact diffusely, strength 5/5 in her right upper and lower extremity, cranial nerves II through XII intact, no slurred speech, NIH stroke scale 7 PSYCH: The patient's mood and manner are appropriate. Grooming and personal hygiene are appropriate.  MEDICAL DECISION MAKING: Patient here with left-sided weakness is her 11:45 PM. She is hemodynamically stable, blood pressure 147/86. Dr. Roseanne Reno with neurology at bedside. Patient will likely not be a TPA candidate given she is on Coumadin and has recently had a therapeutic INR. CT scan shows no acute abnormality.  ED PROGRESS: Dr. Roseanne Reno has seen the patient. Have ordered a CT angio to rule out large vessel occlusion that may be amendable to direct thrombolysis by interventional radiology.  No large vessel occlusion seen on CT angiogram per Neurology.  Given patient has an INR of 1.8 she is not a TPA candidate. Dr. Roseanne Reno with neurology recommends medical admission. Discussed with hospitalist for admission to inpt, tele bed.    EKG Interpretation  Date/Time:  Wednesday August 21 2013 13:43:38 EDT Ventricular Rate:  77 PR Interval:    QRS Duration: 93 QT Interval:  424 QTC Calculation: 480 R Axis:   45 Text Interpretation:  Age not entered, assumed to be  80 years old for purpose of ECG interpretation Atrial fibrillation Artifact Confirmed by Glinda Natzke,  DO, Mechel Haggard [phone]) on 08/21/2013 4:29:32 PM        Layla Maw Stacey Maura, DO 08/21/13 1742

## 2013-08-22 ENCOUNTER — Encounter: Payer: Self-pay | Admitting: Internal Medicine

## 2013-08-22 DIAGNOSIS — I059 Rheumatic mitral valve disease, unspecified: Secondary | ICD-10-CM

## 2013-08-22 DIAGNOSIS — I495 Sick sinus syndrome: Secondary | ICD-10-CM

## 2013-08-22 LAB — HEMOGLOBIN A1C
HEMOGLOBIN A1C: 6.2 % — AB (ref <5.7)
Mean Plasma Glucose: 131 mg/dL — ABNORMAL HIGH (ref <117)

## 2013-08-22 LAB — TROPONIN I: Troponin I: <0.3 ng/mL (ref <0.30)

## 2013-08-22 LAB — LIPID PANEL
Cholesterol: 207 mg/dL — ABNORMAL HIGH (ref 0–200)
HDL: 43 mg/dL (ref >39)
LDL Cholesterol: 124 mg/dL — ABNORMAL HIGH (ref 0–99)
Total CHOL/HDL Ratio: 4.8 RATIO
Triglycerides: 199 mg/dL — ABNORMAL HIGH (ref <150)
VLDL: 40 mg/dL (ref 0–40)

## 2013-08-22 MED ORDER — DILTIAZEM HCL ER COATED BEADS 240 MG PO CP24
240.0000 mg | ORAL_CAPSULE | Freq: Every day | ORAL | Status: DC
  Administered 2013-08-22 – 2013-08-25 (×4): 240 mg via ORAL
  Filled 2013-08-22 (×5): qty 1

## 2013-08-22 MED ORDER — HYDRALAZINE HCL 20 MG/ML IJ SOLN
10.0000 mg | Freq: Four times a day (QID) | INTRAMUSCULAR | Status: DC | PRN
  Administered 2013-08-22: 5 mg via INTRAVENOUS

## 2013-08-22 MED ORDER — WARFARIN SODIUM 4 MG PO TABS
4.0000 mg | ORAL_TABLET | Freq: Once | ORAL | Status: AC
  Administered 2013-08-22: 4 mg via ORAL
  Filled 2013-08-22: qty 1

## 2013-08-22 MED ORDER — ACETAMINOPHEN 325 MG PO TABS
650.0000 mg | ORAL_TABLET | Freq: Four times a day (QID) | ORAL | Status: DC | PRN
  Administered 2013-08-22 – 2013-08-24 (×3): 650 mg via ORAL
  Filled 2013-08-22 (×3): qty 2

## 2013-08-22 NOTE — Progress Notes (Signed)
  Echocardiogram 2D Echocardiogram has been performed.  Georgian Co 08/22/2013, 5:44 PM

## 2013-08-22 NOTE — Progress Notes (Signed)
ANTICOAGULATION CONSULT NOTE   Pharmacy Consult for coumadin Indication: atrial fibrillation and CVA  Allergies  Allergen Reactions  . Morphine And Related Other (See Comments)    hallucinations   Labs:  Recent Labs  08/21/13 1320 08/21/13 1342 08/21/13 1637  HGB 14.3 15.6*  --   HCT 43.2 46.0  --   PLT 335  --   --   APTT 37  --   --   LABPROT 21.1*  --  21.9*  INR 1.82*  --  1.91*  CREATININE 0.87 0.90  --     Estimated Creatinine Clearance: 44.8 ml/min (by C-G formula based on Cr of 0.9).   Assessment: 28 female with afib and CVA will be continued on coumadin.   INR 1.91 (trending up) Patient was on coumadin 3mg  po daily prior to admission.  Goal of Therapy:  INR 2-3 Monitor platelets by anticoagulation protocol: Yes   Plan:  1) Repeat Coumadin 4mg  po x1 2) INR in am  Thank you. , PharmD [phone]  08/22/2013,10:46 AM

## 2013-08-22 NOTE — Progress Notes (Signed)
Stroke Team Progress Note  HISTORY [name] is an 80 y.o. female history of atrial fibrillation on Coumadin, hypertension and hyperlipidemia who was brought to the emergency room following acute onset of left-sided weakness and numbness which started about 11:30 AM 08/21/2013. She has no previous history of stroke nor TIA. She had no changes in speech and no noticeable changes in mental status. CT scan of her head showed no acute intracranial abnormality. Subsequent CTA showed no intracranial large vessel occlusion. INR was 1.82. NIH stroke score was 7. Patient was not administered TPA secondary to INR 1.82. She was admitted for further evaluation and treatment.  SUBJECTIVE Her daughter and pastor are at the bedside.  Overall she feels her condition is stable. She reports she still has left arm and leg weakness and numbness. Long discussion with family related to options on anticoagulants.  Patient's husband died 2 weeks ago and she has a lot of stress lately. Her granddaughter is the Architect at [employer].  OBJECTIVE Most recent Vital Signs: Filed Vitals:   08/22/13 0200 08/22/13 0221 08/22/13 0400 08/22/13 1035  BP: 181/122 181/122 149/79 122/69  Pulse: 83  86 68  Temp: 97.9 F (36.6 C)  97.9 F (36.6 C) 98.1 F (36.7 C)  TempSrc: Oral  Oral Oral  Resp: 18  18 20   Height:      Weight:      SpO2: 95%  94% 95%   CBG (last 3)   Recent Labs  08/21/13 1349  GLUCAP 93    IV Fluid Intake:     MEDICATIONS  .  stroke: mapping our early stages of recovery book   Does not apply Once  . atorvastatin  10 mg Oral Daily  . bisoprolol  5 mg Oral Daily  . diltiazem  240 mg Oral Daily  . isosorbide mononitrate  30 mg Oral Daily  . pantoprazole  40 mg Oral Daily  . rOPINIRole  0.5 mg Oral QHS  . warfarin  4 mg Oral ONCE-1800  . Warfarin - Pharmacist Dosing Inpatient   Does not apply q1800   PRN:  acetaminophen, hydrALAZINE, senna-docusate  Diet:  Cardiac thin liquids Activity:   As tolerated DVT Prophylaxis:  warfarin  CLINICALLY SIGNIFICANT STUDIES Basic Metabolic Panel:   Recent Labs Lab 08/21/13 1320 08/21/13 1342  NA 140 141  K 4.2 4.0  CL 103 105  CO2 22  --   GLUCOSE 100* 103*  BUN 13 13  CREATININE 0.87 0.90  CALCIUM 9.1  --    Liver Function Tests:   Recent Labs Lab 08/21/13 1320  AST 21  ALT 11  ALKPHOS 130*  BILITOT 0.4  PROT 7.3  ALBUMIN 3.4*   CBC:   Recent Labs Lab 08/21/13 1320 08/21/13 1342  WBC 8.7  --   NEUTROABS 3.8  --   HGB 14.3 15.6*  HCT 43.2 46.0  MCV 94.5  --   PLT 335  --    Coagulation:   Recent Labs Lab 08/21/13 1320 08/21/13 1637  LABPROT 21.1* 21.9*  INR 1.82* 1.91*   Cardiac Enzymes:   Recent Labs Lab 08/22/13 0950  TROPONINI <0.30   Urinalysis:   Recent Labs Lab 08/21/13 1851  COLORURINE YELLOW  LABSPEC 1.042*  PHURINE 7.0  GLUCOSEU NEGATIVE  HGBUR MODERATE*  BILIRUBINUR NEGATIVE  KETONESUR NEGATIVE  PROTEINUR NEGATIVE  UROBILINOGEN 0.2  NITRITE NEGATIVE  LEUKOCYTESUR NEGATIVE   Lipid Panel    Component Value Date/Time   CHOL 207* 08/22/2013 0544  TRIG 199* 08/22/2013 0544   HDL 43 08/22/2013 0544   CHOLHDL 4.8 08/22/2013 0544   VLDL 40 08/22/2013 0544   LDLCALC 124* 08/22/2013 0544   HgbA1C  No results found for this basename: HGBA1C    Urine Drug Screen:     Component Value Date/Time   LABOPIA NONE DETECTED 08/21/2013 1851   COCAINSCRNUR NONE DETECTED 08/21/2013 1851   LABBENZ NONE DETECTED 08/21/2013 1851   AMPHETMU NONE DETECTED 08/21/2013 1851   THCU NONE DETECTED 08/21/2013 1851   LABBARB NONE DETECTED 08/21/2013 1851    Alcohol Level:   Recent Labs Lab 08/21/13 1320  ETH <11    CT of the brain  08/21/2013   1. No acute intracranial abnormalities.  No intracranial hemorrhage. 2. Stable atrophy and mild chronic microvascular ischemic change.   CT Angio Head & Neck 08/21/2013    No intracranial occlusion or stenosis. Ordinary atherosclerotic calcification in  the carotid siphon regions. 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  Tortuous vessels in the neck suggesting a history of chronic hypertension. No carotid bifurcation stenosis or irregularity.  Linear defect in the left vertebral artery at the C2 and C3 level. This could represent a chronic fenestration or a chronic dissection. I suspected is probably incidental.    MRI - not able to do due to pacer  2D Echocardiogram   - Left ventricle: The cavity size was normal. Wall thickness was normal. Systolic function was normal. The estimated ejection fraction was in the range of 55% to 60%. - Mitral valve: There was mild regurgitation. - Left atrium: The appendage was moderately dilated. - Right atrium: The atrium was moderately dilated. - Atrial septum: No defect or patent foramen ovale was identified. - Line: A venous catheter was visualized in the superior vena cava, with its tip in the right atrium. No abnormal features noted.   CXR  08/21/2013   No active cardiopulmonary disease. Large hiatal hernia. No interval change.  EKG  atrial fibrillation, rate 77. For complete results please see formal report.   Therapy Recommendations pending  Physical Exam    Temp:  [97.9 F (36.6 C)-98.3 F (36.8 C)] 98.3 F (36.8 C) (07/23 1808) Pulse Rate:  [60-86] 63 (07/23 1808) Resp:  [18-20] 18 (07/23 1808) BP: (64-182)/(32-147) 120/70 mmHg (07/23 1910) SpO2:  [93 %-95 %] 94 % (07/23 1808)  General - Well nourished, well developed, in no apparent distress.  Ophthalmologic - Sharp disc margins OU.  Cardiovascular - irregularly irregular heart rhythm and rate.  Mental Status -  Level of arousal and orientation to time, place, and person were intact. Language including expression, naming, repetition, comprehension, reading, and writing was assessed and found intact.  Cranial Nerves II - XII - II - Vision intact OU. III, IV, VI - Extraocular movements intact. V -  Facial sensation intact bilaterally. VII - Facial movement intact bilaterally. VIII - Hearing & vestibular intact bilaterally. X - Palate elevates symmetrically. XI - Chin turning & shoulder shrug intact bilaterally. XII - Tongue protrusion intact.  Motor Strength - The patient's strength was 4/5 LUE and LLE, however, there were significant give-away weakness present. Bulk was normal and fasciculations were absent.   Motor Tone - Muscle tone was assessed at the neck and appendages and was normal.  Reflexes - The patient's reflexes were normal in all extremities and she had no pathological reflexes.  Sensory - Light touch, temperature/pinprick were assessed and were decreased on the left side.  Coordination - The patient had normal movements in the hands and feet with no ataxia or dysmetria.  Tremor was absent.  Gait and Station - not tested due to safety.   ASSESSMENT [name] is a 80 y.o. female presenting with left-sided weakness and numbness. Initial imaging does not confirm new stroke, however, she cannot have MRI due to pacer. She still has left arm symptoms. Suspect right brain embolic infarct secondary to known atrial fibrillation with subtherapeutic INR.  However, there are functional component present, likely due to recent stress. On warfarin prior to admission; INR 1.82 on arrival, pt states she has been compliant. Now on warfarin for secondary stroke prevention given no sign of large ischemic stroke.    Hypertension. BP 122-182/53-137  Paroxysmal atrial fibrillation. EP recommends change to eliquis. Discussed with pt/family. On home bisoprolol and diltiazem. Hyperlipidemia, LDL 124, on no statin PTA, started on lipitor 10 mg this admission, goal LDL < 100 (< 70 for diabetics) A1C 6.2, within the goal Morbid Obesity, Body mass index is 40.04 kg/(m^2).  Stress related to recent death of her husband 2 weeks ago, may be exacerbating her stroke symptoms  Hospital day  # 1  TREATMENT/PLAN  Cardiology recommend NOAC. However, pt reported that her INR was stable at therapeutic level at home, did not feel difficulty to control INR, get used to the life style while using coumadin and given her age, continue coumadin is also a good option. Had long discussion with pt and family regarding options on anticoagulation, they are leaning towards coumadin. If change to NOAC, recommend eliquis 5mg  bid.   A1C 6.2, within the goal < 6.5  Therapy evals  Significant stress lately with functional component on exam, continue in need of social support.  Repeat CT head in am to confirm stroke  , MSN, RN, ANVP-BC, ANP-BC, GNP-BC [facility] Pager: [phone] 08/22/2013 1:18 PM  SIGNED I, the attending vascular neurologist, have personally obtained a history, examined the patient, evaluated laboratory data, individually viewed imaging studies, and formulated the assessment and plan of care.  I have made any additions or clarifications directly to the above note and agree with the findings and plan as currently documented.   08/24/2013, MD PhD 08/22/2013 9:27 PM   To contact Stroke Continuity provider, please refer to 08/24/2013. After hours, contact General Neurology

## 2013-08-22 NOTE — Progress Notes (Signed)
[facility] PROGRESS NOTE  BYRD TERRERO VXB:[id number] DOB: [birth year] DOA: 08/21/2013 PCP: Charlott Rakes, MD  Assessment/Plan: Active Problems:   CVA (cerebral infarction)   TIA (transient ischemic attack)    Left-sided weakness Concern for stroke in the setting of subtherapeutic INR Neurology suspects probably right subcortical ischemic cerebral infarction Cannot have an MRI because of pacemaker CT angiogram of the head and neck is negative Hemoglobin A1c pending Triglycerides 199, LD 124 PT/OT eval  Atrial fibrillation INR subtherapeutic On diltiazem Cardiology consulted for pacemaker interrogation and adjustment of medications   hypertension Uncontrolled, on bisoprolol, diltiazem, Imdur Diltiazem increased to 40 mg daily    Code Status: full Family Communication: family updated about patient's clinical progress Disposition Plan:  Anticipate discharge tomorrow   Brief narrative: [name] is an 80 y.o. female history of atrial fibrillation on Coumadin, hypertension and hyperlipidemia who was brought to the emergency room following acute onset of left-sided weakness and numbness which started about 11:30 AM today. She has no previous history of stroke nor TIA. She had no changes in speech and no noticeable changes in mental status. CT scan of her head showed no acute intracranial abnormality. Subsequent CTA showed no intracranial large vessel occlusion. INR was 1.82. NIH stroke score was 7.   Consultants:  Neurology  Procedures:  None  Antibiotics:  None  HPI/Subjective: Symptoms of left-sided weakness have improved  Objective: Filed Vitals:   08/22/13 0200 08/22/13 0221 08/22/13 0400 08/22/13 1035  BP: 181/122 181/122 149/79 122/69  Pulse: 83  86 68  Temp: 97.9 F (36.6 C)  97.9 F (36.6 C) 98.1 F (36.7 C)  TempSrc: Oral  Oral Oral  Resp: 18  18 20   Height:      Weight:      SpO2: 95%  94% 95%    Intake/Output Summary (Last 24  hours) at 08/22/13 1207 Last data filed at 08/22/13 1104  Gross per 24 hour  Intake    740 ml  Output      0 ml  Net    740 ml    Exam:  HENT:  Head: Atraumatic.  Nose: Nose normal.  Mouth/Throat: Oropharynx is clear and moist.  Eyes: Conjunctivae are normal. Pupils are equal, round, and reactive to light. No scleral icterus.  Neck: Neck supple. No tracheal deviation present.  Cardiovascular: Normal rate, regular rhythm, normal heart sounds and intact distal pulses.  Pulmonary/Chest: Effort normal and breath sounds normal. No respiratory distress.  Abdominal: Soft. Normal appearance and bowel sounds are normal. She exhibits no distension. There is no tenderness.  Musculoskeletal: She exhibits no edema and no tenderness.  Neurological: She is alert. No cranial nerve deficit.    Data Reviewed: Basic Metabolic Panel:  Recent Labs Lab 08/21/13 1320 08/21/13 1342  NA 140 141  K 4.2 4.0  CL 103 105  CO2 22  --   GLUCOSE 100* 103*  BUN 13 13  CREATININE 0.87 0.90  CALCIUM 9.1  --     Liver Function Tests:  Recent Labs Lab 08/21/13 1320  AST 21  ALT 11  ALKPHOS 130*  BILITOT 0.4  PROT 7.3  ALBUMIN 3.4*   No results found for this basename: LIPASE, AMYLASE,  in the last 168 hours No results found for this basename: AMMONIA,  in the last 168 hours  CBC:  Recent Labs Lab 08/21/13 1320 08/21/13 1342  WBC 8.7  --   NEUTROABS 3.8  --   HGB 14.3 15.6*  HCT 43.2  46.0  MCV 94.5  --   PLT 335  --     Cardiac Enzymes:  Recent Labs Lab 08/22/13 0950  TROPONINI <0.30   BNP (last 3 results)  Recent Labs  09/26/12 1002  PROBNP 1265.0*     CBG:  Recent Labs Lab 08/21/13 1349  GLUCAP 93    No results found for this or any previous visit (from the past 240 hour(s)).   Studies: Ct Angio Head W/cm &/or Wo Cm  08/21/2013   CLINICAL DATA:  Weakness of the left side affecting the arm and leg  EXAM: CT ANGIOGRAPHY HEAD AND NECK  TECHNIQUE:  Multidetector CT imaging of the head and neck was performed using the standard protocol during bolus administration of intravenous contrast. Multiplanar CT image reconstructions and MIPs were obtained to evaluate the vascular anatomy. Carotid stenosis measurements (when applicable) are obtained utilizing NASCET criteria, using the distal internal carotid diameter as the denominator.  CONTRAST:  58mL OMNIPAQUE IOHEXOL 350 MG/ML SOLN  COMPARISON:  Head CT same day  FINDINGS: CTA HEAD FINDINGS  Both internal carotid arteries show calcification in the siphon regions but there is no evidence of flow-limiting stenosis. The supra clinoid internal carotid arteries are widely patent. The anterior and middle cerebral arteries are patent without proximal stenosis come aneurysm or vascular malformation. No missing branches are demonstrated. The  Both vertebral arteries are patent through the foramen magnum to the basilar. No basilar stenosis. Posterior circulation branch vessels appear normal.  No intracranial venous pathology. No intracranial hemorrhage or evidence of acute infarction. There is chronic calcification in the left cerebellum which appears to be associated with some abnormal vessels. This probably represents a developmental venous anomaly.  Review of the MIP images confirms the above findings.  CTA NECK FINDINGS  Lung apices are clear.  No superior mediastinal lesion.  The aortic arch shows atherosclerotic calcification but no aneurysm or dissection. The branching pattern of the brachiocephalic vessels from the arch is normal. No origin stenoses.  The right common carotid artery is extremely tortuous but is widely patent to the bifurcation. The bifurcation is widely patent without stenosis or irregularity. The right internal carotid artery is smooth and of normal caliber.  The left common carotid artery is markedly tortuous but widely patent to the bifurcation. The carotid bifurcation does not show any stenosis or  irregularity. The cervical internal carotid artery is tortuous but widely patent.  The right vertebral artery origin is widely patent. The left vertebral artery origin is probably widely patent, but not optimally seen because of venous reflux. Right vertebral artery is tortuous but widely patent beyond that. The left vertebral artery is tortuous. At the C2 and C3 level, there is either a fenestration or chronic dissection at the vertebral artery. There is no stenosis. The vessel appears widely patent beyond that.  Review of the MIP images confirms the above findings.  IMPRESSION: No intracranial occlusion or stenosis. Ordinary atherosclerotic calcification in the carotid siphon regions. 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  Tortuous vessels in the neck suggesting a history of chronic hypertension. No carotid bifurcation stenosis or irregularity.  Linear defect in the left vertebral artery at the C2 and C3 level. This could represent a chronic fenestration or a chronic dissection. I suspected is probably incidental.   Electronically Signed   By: Paulina Fusi M.D.   On: 08/21/2013 15:01   Dg Chest 2 View  08/21/2013   CLINICAL DATA:  Short of breath.  EXAM: CHEST  2 VIEW  COMPARISON:  04/22/2013.  FINDINGS: Apical lordotic projection. Large hiatal hernia. Cardiopericardial silhouette appears similar to the prior exam allowing for differences in projection. There is no airspace disease. No pleural effusion is identified. Thoracic kyphosis is present. Two lead LEFT subclavian cardiac pacemaker. Monitoring leads project over the chest.  IMPRESSION: No active cardiopulmonary disease. Large hiatal hernia. No interval change.   Electronically Signed   By: Andreas Newport M.D.   On: 08/21/2013 20:39   Ct Head Wo Contrast  08/21/2013   CLINICAL DATA:  Abnormal sensation and weakness on left side involving the arm and leg.  EXAM: CT HEAD WITHOUT CONTRAST  TECHNIQUE: Contiguous axial  images were obtained from the base of the skull through the vertex without intravenous contrast.  COMPARISON:  04/22/2013  FINDINGS: Ventricles are normal in configuration. There is ventricular enlargement, greater than sulcal enlargement, consistent with predominantly central atrophy. This is stable. There is no hydrocephalus.  No parenchymal masses or mass effect. There is no evidence of a recent cortical infarct. Mild white matter hypoattenuation is noted most consistent with chronic microvascular ischemic change. Stable calcification in the left cerebellum.  No extra-axial masses or abnormal fluid collections.  There is no intracranial hemorrhage.  Visualized sinuses and mastoid air cells are clear.  IMPRESSION: 1. No acute intracranial abnormalities.  No intracranial hemorrhage. 2. Stable atrophy and mild chronic microvascular ischemic change. These results were called by telephone at the time of interpretation on 08/21/2013 at 1:46 pm to Dr. Roseanne Reno , who verbally acknowledged these results.   Electronically Signed   By: Amie Portland M.D.   On: 08/21/2013 13:47   Ct Angio Neck W/cm &/or Wo/cm  08/21/2013   CLINICAL DATA:  Weakness of the left side affecting the arm and leg  EXAM: CT ANGIOGRAPHY HEAD AND NECK  TECHNIQUE: Multidetector CT imaging of the head and neck was performed using the standard protocol during bolus administration of intravenous contrast. Multiplanar CT image reconstructions and MIPs were obtained to evaluate the vascular anatomy. Carotid stenosis measurements (when applicable) are obtained utilizing NASCET criteria, using the distal internal carotid diameter as the denominator.  CONTRAST:  19mL OMNIPAQUE IOHEXOL 350 MG/ML SOLN  COMPARISON:  Head CT same day  FINDINGS: CTA HEAD FINDINGS  Both internal carotid arteries show calcification in the siphon regions but there is no evidence of flow-limiting stenosis. The supra clinoid internal carotid arteries are widely patent. The anterior and  middle cerebral arteries are patent without proximal stenosis come aneurysm or vascular malformation. No missing branches are demonstrated. The  Both vertebral arteries are patent through the foramen magnum to the basilar. No basilar stenosis. Posterior circulation branch vessels appear normal.  No intracranial venous pathology. No intracranial hemorrhage or evidence of acute infarction. There is chronic calcification in the left cerebellum which appears to be associated with some abnormal vessels. This probably represents a developmental venous anomaly.  Review of the MIP images confirms the above findings.  CTA NECK FINDINGS  Lung apices are clear.  No superior mediastinal lesion.  The aortic arch shows atherosclerotic calcification but no aneurysm or dissection. The branching pattern of the brachiocephalic vessels from the arch is normal. No origin stenoses.  The right common carotid artery is extremely tortuous but is widely patent to the bifurcation. The bifurcation is widely patent without stenosis or irregularity. The right internal carotid artery is smooth and of normal caliber.  The left common carotid artery is markedly tortuous but widely  patent to the bifurcation. The carotid bifurcation does not show any stenosis or irregularity. The cervical internal carotid artery is tortuous but widely patent.  The right vertebral artery origin is widely patent. The left vertebral artery origin is probably widely patent, but not optimally seen because of venous reflux. Right vertebral artery is tortuous but widely patent beyond that. The left vertebral artery is tortuous. At the C2 and C3 level, there is either a fenestration or chronic dissection at the vertebral artery. There is no stenosis. The vessel appears widely patent beyond that.  Review of the MIP images confirms the above findings.  IMPRESSION: No intracranial occlusion or stenosis. Ordinary atherosclerotic calcification in the carotid siphon regions.  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  Tortuous vessels in the neck suggesting a history of chronic hypertension. No carotid bifurcation stenosis or irregularity.  Linear defect in the left vertebral artery at the C2 and C3 level. This could represent a chronic fenestration or a chronic dissection. I suspected is probably incidental.   Electronically Signed   By: Paulina Fusi M.D.   On: 08/21/2013 15:01    Scheduled Meds: .  stroke: mapping our early stages of recovery book   Does not apply Once  . atorvastatin  10 mg Oral Daily  . bisoprolol  5 mg Oral Daily  . diltiazem  240 mg Oral Daily  . isosorbide mononitrate  30 mg Oral Daily  . pantoprazole  40 mg Oral Daily  . rOPINIRole  0.5 mg Oral QHS  . warfarin  4 mg Oral ONCE-1800  . Warfarin - Pharmacist Dosing Inpatient   Does not apply q1800   Continuous Infusions:   Active Problems:   CVA (cerebral infarction)   TIA (transient ischemic attack)    Time spent: 40 minutes   [facility]  [facility] Pager [phone]. If 8PM-8AM, please contact night-coverage at [email or web address], password [facility] 08/22/2013, 12:07 PM  LOS: 1 day

## 2013-08-22 NOTE — Clinical Documentation Improvement (Signed)
Patient admitted with CVA. "Hypertension. BP 122-182/53-137" is documented in the progress notes on 7/23. Hydralazine (Apresoline) 5mg  IV was given at 08/22/13 0029. During this time the patient's blood pressure readings were as follows: 182/147, 182/142, 181/122. Please help, if possible, by specifying the clinical significance of these findings.  Possible Clinical Conditions?  - Malignant HTN  (BP > 170/110 - with vague symptoms)  - Accelerated HTN  (BP > 180/120 - with evidence of end organ damage)  - Other condition (please document in the progress notes and/or discharge summary)  - Cannot Clinically determine at this time     Thank You, 08/24/13 ,RN Clinical Documentation Specialist:  [phone]  [facility]- Health Information Management

## 2013-08-22 NOTE — Progress Notes (Signed)
Ep asked to interrogate the patients pacemaker  She has permanent afib Unfortunately she has had a stroke in the setting of a subtherapeutic INR  Her pacemaker function is normal. She is programmed VVI 60 bpm due to permanent afib. She V paces 15%.  The majority of her heart rates appear to be < 100 bpm No changes are recommended at this time to her device programming (see paper chart).  Given labile INR leading to stroke, I would strongly advise consideration of a NOAC.  I think that Eliquis would be a very good option for her.  I will defer to neurology.  Continue home bisoprolol and diltiazem dosing.  Can titrate diltiazem if needed.  Goal heart rates should be mostly < 100 bpm.  Electrophysiology team to see as needed while here. Please call with questions.

## 2013-08-22 NOTE — Evaluation (Signed)
Physical Therapy Evaluation Patient Details Name: [name] MRN: [id number] DOB: 03-20-1923 Today's Date: 08/22/2013   History of Present Illness  Admitted with L upper and lower extremity numbness and weakness.  PMHx--afib, pacer  Clinical Impression  Pt admitted with/for acute onset of left sided weakness.  Pt currently limited functionally due to the problems listed. ( See problems list.)   Pt will benefit from PT to maximize function and safety in order to get ready for next venue listed below.     Follow Up Recommendations CIR    Equipment Recommendations  Rolling walker with 5" wheels    Recommendations for Other Services Rehab consult     Precautions / Restrictions Precautions Precautions: Fall      Mobility  Bed Mobility Overal bed mobility: Needs Assistance Bed Mobility: Supine to Sit     Supine to sit: Min assist     General bed mobility comments: cues for sequencing; truncal assist  Transfers Overall transfer level: Needs assistance Equipment used: Rolling walker (2 wheeled) Transfers: Sit to/from [employer] Sit to Stand: Min assist Stand pivot transfers: Min assist       General transfer comment: cues for hand placement and steady assist, elevated surface  Ambulation/Gait Ambulation/Gait assistance: Min assist;Mod assist Ambulation Distance (Feet): 15 Feet (to/from bathroom; after rest 45 feet into hall and back) Assistive device: Rolling walker (2 wheeled) Gait Pattern/deviations: Step-through pattern;Decreased step length - right;Decreased stance time - left;Decreased stride length;Trunk flexed Gait velocity: slow   General Gait Details: unequal step length, with mildly flexing knee with WBing.  Stairs            Wheelchair Mobility    Modified Rankin (Stroke Patients Only) Modified Rankin (Stroke Patients Only) Pre-Morbid Rankin Score: No symptoms Modified Rankin: Moderately severe disability     Balance  Overall balance assessment: Needs assistance Sitting-balance support: Feet supported;No upper extremity supported Sitting balance-Leahy Scale: Good Sitting balance - Comments: accepted mod. challenge before losing balance   Standing balance support: Bilateral upper extremity supported Standing balance-Leahy Scale: Poor                               Pertinent Vitals/Pain     Home Living Family/patient expects to be discharged to:: Private residence Living Arrangements: Other (Comment) (great granddaughter there most of the time) Available Help at Discharge: Family;Available 24 hours/day Type of Home: House Home Access: Stairs to enter Entrance Stairs-Rails: Doctor, general practice of Steps: 2 Home Layout: One level Home Equipment: Other (comment) (TBA)      Prior Function Level of Independence: Independent         Comments: Could drive and run errands if not done for her by family     Hand Dominance        Extremity/Trunk Assessment   Upper Extremity Assessment: Defer to OT evaluation (general weakness grip, bi/triceps and shd flexion)           Lower Extremity Assessment: Generalized weakness;LLE deficits/detail   LLE Deficits / Details: quads 3/5, hams 3-/, hip flexors 2+/, df/pf 3-/ ; movement in synergy distally  Cervical / Trunk Assessment: Other exceptions (can handle minimal perturbation; decr. righting reaction)  Communication   Communication: No difficulties  Cognition Arousal/Alertness: Awake/alert Behavior During Therapy: WFL for tasks assessed/performed Overall Cognitive Status: Within Functional Limits for tasks assessed  General Comments      Exercises        Assessment/Plan    PT Assessment Patient needs continued PT services  PT Diagnosis Difficulty walking (hemiparesis)   PT Problem List Decreased strength;Decreased activity tolerance;Decreased balance;Decreased mobility;Decreased  coordination;Decreased knowledge of use of DME;Impaired sensation  PT Treatment Interventions DME instruction;Gait training;Functional mobility training;Therapeutic activities;Neuromuscular re-education;Balance training;Patient/family education   PT Goals (Current goals can be found in the Care Plan section) Acute Rehab PT Goals Patient Stated Goal: Be able to live by myself PT Goal Formulation: With patient Time For Goal Achievement: 09/05/13 Potential to Achieve Goals: Good    Frequency Min 4X/week   Barriers to discharge        Co-evaluation               End of Session Equipment Utilized During Treatment: Gait belt Activity Tolerance: Patient tolerated treatment well;Patient limited by fatigue Patient left: in chair;with call bell/phone within reach;with family/visitor present Nurse Communication: Mobility status         Time: 1749-4496 PT Time Calculation (min): 31 min   Charges:   PT Evaluation $Initial PT Evaluation Tier I: 1 Procedure PT Treatments $Gait Training: 8-22 mins $Therapeutic Activity: 8-22 mins   PT G Codes:          Daphene Chisholm, Eliseo Gum 08/22/2013, 4:07 PM 08/22/2013  Sidman Bing, PT [phone]  (pager)

## 2013-08-22 NOTE — Progress Notes (Signed)
Nutrition Brief Note  Patient identified on the Malnutrition Screening Tool (MST) Report  Wt Readings from Last 15 Encounters:  08/21/13 205 lb 0.4 oz (93 kg)  04/10/13 199 lb 8 oz (90.493 kg)  03/05/13 200 lb 8 oz (90.946 kg)  12/03/12 200 lb (90.719 kg)  11/07/12 200 lb 8 oz (90.946 kg)  10/09/12 200 lb 4 oz (90.833 kg)  09/26/12 202 lb 12.8 oz (91.989 kg)  02/22/12 192 lb 11.2 oz (87.408 kg)  02/14/12 196 lb (88.905 kg)  10/18/11 196 lb (88.905 kg)  05/09/11 199 lb 12.8 oz (90.629 kg)  09/06/10 190 lb (86.183 kg)  08/23/10 187 lb (84.823 kg)  08/16/10 187 lb (84.823 kg)  07/01/10 187 lb (84.823 kg)    Body mass index is 40.04 kg/(m^2). Patient meets criteria for Obesity based on current BMI. Pt reports losing 9 lbs in the past month due to decreased appetite since her husband passed away. No evidence of weight loss per weight history above.  She states that the past few days her appetite has started to improve and she is eating better. Pt appears well-nourished.   Current diet order is Heart Healthy, patient is consuming approximately 75% of meals at this time. Labs and medications reviewed.   No nutrition interventions warranted at this time. If nutrition issues arise, please consult RD.   Ian Malkin RD, LDN Inpatient Clinical Dietitian Pager: [phone] After Hours Pager: [phone]

## 2013-08-22 NOTE — Progress Notes (Signed)
SLP Cancellation Note  Patient Details Name: [name] MRN: [id number] DOB: 04/20/1923   Cancelled treatment:       Reason Eval/Treat Not Completed: SLP screened, no needs identified, will sign off. Patient and daughters present report that patient is at her baseline level of function. They all endorse some degree of memory impairment at baseline, however this does not appear to be exacerbated and she has several family members that support her at home. No further SLP intervention is warranted at this time.    Maxcine Ham, M.A. CCC-SLP [phone]  Maxcine Ham 08/22/2013, 2:49 PM

## 2013-08-22 NOTE — Progress Notes (Signed)
CARE MANAGEMENT NOTE 08/22/2013  Patient:  [name], [name]   Account Number:  [id number]  Date Initiated:  08/22/2013  Documentation initiated by:  Jiles Crocker  Subjective/Objective Assessment:   ADMITTED FOR STROKE WORKUP     Action/Plan:   CM FOLLOWING FOR DCP   Anticipated DC Date:  08/25/2013   Anticipated DC Plan:  AWAITING FOR PT/OT EVALS FOR DISCHARGE PLANNING     DC Planning Services  CM consult        Status of service:  In process, will continue to follow Medicare Important Message given?   (If response is "NO", the following Medicare IM given date fields will be blank)  Per UR Regulation:  Reviewed for med. necessity/level of care/duration of stay  Comments:  7/23/2015Abelino Derrick RN,BSN,MHA [phone]

## 2013-08-23 ENCOUNTER — Encounter (HOSPITAL_COMMUNITY): Payer: Self-pay | Admitting: Radiology

## 2013-08-23 ENCOUNTER — Inpatient Hospital Stay (HOSPITAL_COMMUNITY): Payer: Medicare HMO

## 2013-08-23 DIAGNOSIS — I6330 Cerebral infarction due to thrombosis of unspecified cerebral artery: Secondary | ICD-10-CM

## 2013-08-23 DIAGNOSIS — E785 Hyperlipidemia, unspecified: Secondary | ICD-10-CM

## 2013-08-23 LAB — FOLATE: FOLATE: 13.7 ng/mL

## 2013-08-23 LAB — BASIC METABOLIC PANEL
Anion gap: 16 — ABNORMAL HIGH (ref 5–15)
BUN: 11 mg/dL (ref 6–23)
CALCIUM: 9.2 mg/dL (ref 8.4–10.5)
CO2: 25 meq/L (ref 19–32)
Chloride: 102 mEq/L (ref 96–112)
Creatinine, Ser: 0.94 mg/dL (ref 0.50–1.10)
GFR calc Af Amer: 61 mL/min — ABNORMAL LOW (ref >90)
GFR calc non Af Amer: 53 mL/min — ABNORMAL LOW (ref >90)
Glucose, Bld: 100 mg/dL — ABNORMAL HIGH (ref 70–99)
Potassium: 4.4 mEq/L (ref 3.7–5.3)
Sodium: 143 mEq/L (ref 137–147)

## 2013-08-23 LAB — CBC
HEMATOCRIT: 43 % (ref 36.0–46.0)
Hemoglobin: 14.3 g/dL (ref 12.0–15.0)
MCH: 31.5 pg (ref 26.0–34.0)
MCHC: 33.3 g/dL (ref 30.0–36.0)
MCV: 94.7 fL (ref 78.0–100.0)
Platelets: 341 10*3/uL (ref 150–400)
RBC: 4.54 MIL/uL (ref 3.87–5.11)
RDW: 14.4 % (ref 11.5–15.5)
WBC: 8.8 10*3/uL (ref 4.0–10.5)

## 2013-08-23 LAB — PROTIME-INR
INR: 2.06 — ABNORMAL HIGH (ref 0.00–1.49)
Prothrombin Time: 23.2 seconds — ABNORMAL HIGH (ref 11.6–15.2)

## 2013-08-23 LAB — TSH: TSH: 4.04 u[IU]/mL (ref 0.350–4.500)

## 2013-08-23 LAB — T4, FREE: Free T4: 1 ng/dL (ref 0.80–1.80)

## 2013-08-23 LAB — VITAMIN B12: Vitamin B-12: 387 pg/mL (ref 211–911)

## 2013-08-23 MED ORDER — WARFARIN SODIUM 4 MG PO TABS
4.0000 mg | ORAL_TABLET | Freq: Once | ORAL | Status: AC
Start: 2013-08-23 — End: 2013-08-23
  Administered 2013-08-23: 4 mg via ORAL
  Filled 2013-08-23: qty 1

## 2013-08-23 MED ORDER — SIMVASTATIN 20 MG PO TABS: 20.0000 mg | ORAL_TABLET | Freq: Every day | ORAL | Status: DC

## 2013-08-23 MED ORDER — DILTIAZEM HCL ER COATED BEADS 120 MG PO CP24: 120.0000 mg | ORAL_CAPSULE | Freq: Every day | ORAL | Status: DC

## 2013-08-23 MED ORDER — BISOPROLOL FUMARATE 5 MG PO TABS: 10.0000 mg | ORAL_TABLET | Freq: Two times a day (BID) | ORAL | Status: DC

## 2013-08-23 MED ORDER — PRAVASTATIN SODIUM 40 MG PO TABS: 40.0000 mg | ORAL_TABLET | Freq: Every day | ORAL | Status: DC

## 2013-08-23 NOTE — Discharge Summary (Addendum)
Physician Discharge Summary  [name] MRN: [id number] DOB/AGE: [birth year] 80 y.o.  PCP: Maryella Shivers, MD   Admit date: 08/21/2013 Discharge date: 08/23/2013  Discharge Diagnoses:      CVA (cerebral infarction)   TIA (transient ischemic attack) Atrial fibrillation-subtherapeutic INR  Followup recommendations Continue to follow INR daily on inpatient rehabilitation-pharmacy to dose       Medication List         bisoprolol 5 MG tablet  Commonly known as:  ZEBETA  Take 2 tablets (10 mg total) by mouth 2 (two) times daily.     diltiazem 120 MG 24 hr capsule  Commonly known as:  CARDIZEM CD  Take 1 capsule (120 mg total) by mouth daily.     furosemide 20 MG tablet  Commonly known as:  LASIX  Take 40 mg by mouth daily.     isosorbide mononitrate 30 MG 24 hr tablet  Commonly known as:  IMDUR  Take 30 mg by mouth daily.     omeprazole 20 MG capsule  Commonly known as:  PRILOSEC  Take 20 mg by mouth daily.     potassium chloride SA 20 MEQ tablet  Commonly known as:  K-DUR,KLOR-CON  Take 1 tablet (20 mEq total) by mouth daily.     rOPINIRole 0.5 MG tablet  Commonly known as:  REQUIP  Take 0.5 mg by mouth at bedtime.     TUMS 500 MG chewable tablet  Generic drug:  calcium carbonate  Chew 1 tablet by mouth daily as needed for indigestion or heartburn.     warfarin 3 MG tablet  Commonly known as:  COUMADIN  Take 3 mg by mouth daily.    Pravastatin 40 mg a day      Discharge Condition:   Disposition: 01-Home or Self Care   Consults:  Neurology Electrophysiology   Significant Diagnostic Studies: Ct Angio Head W/cm &/or Wo Cm  08/21/2013   CLINICAL DATA:  Weakness of the left side affecting the arm and leg  EXAM: CT ANGIOGRAPHY HEAD AND NECK  TECHNIQUE: Multidetector CT imaging of the head and neck was performed using the standard protocol during bolus administration of intravenous contrast. Multiplanar CT image reconstructions and MIPs were  obtained to evaluate the vascular anatomy. Carotid stenosis measurements (when applicable) are obtained utilizing NASCET criteria, using the distal internal carotid diameter as the denominator.  CONTRAST:  11m OMNIPAQUE IOHEXOL 350 MG/ML SOLN  COMPARISON:  Head CT same day  FINDINGS: CTA HEAD FINDINGS  Both internal carotid arteries show calcification in the siphon regions but there is no evidence of flow-limiting stenosis. The supra clinoid internal carotid arteries are widely patent. The anterior and middle cerebral arteries are patent without proximal stenosis come aneurysm or vascular malformation. No missing branches are demonstrated. The  Both vertebral arteries are patent through the foramen magnum to the basilar. No basilar stenosis. Posterior circulation branch vessels appear normal.  No intracranial venous pathology. No intracranial hemorrhage or evidence of acute infarction. There is chronic calcification in the left cerebellum which appears to be associated with some abnormal vessels. This probably represents a developmental venous anomaly.  Review of the MIP images confirms the above findings.  CTA NECK FINDINGS  Lung apices are clear.  No superior mediastinal lesion.  The aortic arch shows atherosclerotic calcification but no aneurysm or dissection. The branching pattern of the brachiocephalic vessels from the arch is normal. No origin stenoses.  The right common carotid artery is extremely tortuous but is  widely patent to the bifurcation. The bifurcation is widely patent without stenosis or irregularity. The right internal carotid artery is smooth and of normal caliber.  The left common carotid artery is markedly tortuous but widely patent to the bifurcation. The carotid bifurcation does not show any stenosis or irregularity. The cervical internal carotid artery is tortuous but widely patent.  The right vertebral artery origin is widely patent. The left vertebral artery origin is probably widely  patent, but not optimally seen because of venous reflux. Right vertebral artery is tortuous but widely patent beyond that. The left vertebral artery is tortuous. At the C2 and C3 level, there is either a fenestration or chronic dissection at the vertebral artery. There is no stenosis. The vessel appears widely patent beyond that.  Review of the MIP images confirms the above findings.  IMPRESSION: No intracranial occlusion or stenosis. Ordinary atherosclerotic calcification in the carotid siphon regions. 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  Tortuous vessels in the neck suggesting a history of chronic hypertension. No carotid bifurcation stenosis or irregularity.  Linear defect in the left vertebral artery at the C2 and C3 level. This could represent a chronic fenestration or a chronic dissection. I suspected is probably incidental.   Electronically Signed   By: Nelson Chimes M.D.   On: 08/21/2013 15:01   Dg Chest 2 View  08/21/2013   CLINICAL DATA:  Short of breath.  EXAM: CHEST  2 VIEW  COMPARISON:  04/22/2013.  FINDINGS: Apical lordotic projection. Large hiatal hernia. Cardiopericardial silhouette appears similar to the prior exam allowing for differences in projection. There is no airspace disease. No pleural effusion is identified. Thoracic kyphosis is present. Two lead LEFT subclavian cardiac pacemaker. Monitoring leads project over the chest.  IMPRESSION: No active cardiopulmonary disease. Large hiatal hernia. No interval change.   Electronically Signed   By: Dereck Ligas M.D.   On: 08/21/2013 20:39   Ct Head Wo Contrast  08/23/2013   CLINICAL DATA:  Follow-up of stroke.  EXAM: CT HEAD WITHOUT CONTRAST  TECHNIQUE: Contiguous axial images were obtained from the base of the skull through the vertex without intravenous contrast.  COMPARISON:  CT of the head August 21, 2013  FINDINGS: The ventricles and sulci are normal for age. No intraparenchymal hemorrhage, mass effect  nor midline shift. Patchy supratentorial white matter hypodensities are within normal range for patient's age and though non-specific suggest sequelae of chronic small vessel ischemic disease. No acute large vascular territory infarcts.  No abnormal extra-axial fluid collections. Dystrophic calcification left cerebellum likely reflects cavernoma. Basal cisterns are patent. Moderate calcific atherosclerosis of the carotid siphons. Slightly dense appearance of the basilar tip likely reflecting atherosclerosis.  No skull fracture. The included ocular globes and orbital contents are non-suspicious. The mastoid aircells and included paranasal sinuses are well-aerated.  IMPRESSION: No acute intracranial process.  Stable appearance of the head: Involutional changes, mild white matter changes suggest chronic small vessel ischemic disease. 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   Electronically Signed   By: Elon Alas   On: 08/23/2013 02:01   Ct Head Wo Contrast  08/21/2013   CLINICAL DATA:  Abnormal sensation and weakness on left side involving the arm and leg.  EXAM: CT HEAD WITHOUT CONTRAST  TECHNIQUE: Contiguous axial images were obtained from the base of the skull through the vertex without intravenous contrast.  COMPARISON:  04/22/2013  FINDINGS: Ventricles are normal in configuration. There is ventricular enlargement, greater than sulcal enlargement, consistent with predominantly  central atrophy. This is stable. There is no hydrocephalus.  No parenchymal masses or mass effect. There is no evidence of a recent cortical infarct. Mild white matter hypoattenuation is noted most consistent with chronic microvascular ischemic change. Stable calcification in the left cerebellum.  No extra-axial masses or abnormal fluid collections.  There is no intracranial hemorrhage.  Visualized sinuses and mastoid air cells are clear.  IMPRESSION: 1. No acute intracranial abnormalities.  No intracranial hemorrhage. 2. Stable  atrophy and mild chronic microvascular ischemic change. These results were called by telephone at the time of interpretation on 08/21/2013 at 1:46 pm to Dr. Nicole Kindred , who verbally acknowledged these results.   Electronically Signed   By: Lajean Manes M.D.   On: 08/21/2013 13:47   Ct Angio Neck W/cm &/or Wo/cm  08/21/2013   CLINICAL DATA:  Weakness of the left side affecting the arm and leg  EXAM: CT ANGIOGRAPHY HEAD AND NECK  TECHNIQUE: Multidetector CT imaging of the head and neck was performed using the standard protocol during bolus administration of intravenous contrast. Multiplanar CT image reconstructions and MIPs were obtained to evaluate the vascular anatomy. Carotid stenosis measurements (when applicable) are obtained utilizing NASCET criteria, using the distal internal carotid diameter as the denominator.  CONTRAST:  75m OMNIPAQUE IOHEXOL 350 MG/ML SOLN  COMPARISON:  Head CT same day  FINDINGS: CTA HEAD FINDINGS  Both internal carotid arteries show calcification in the siphon regions but there is no evidence of flow-limiting stenosis. The supra clinoid internal carotid arteries are widely patent. The anterior and middle cerebral arteries are patent without proximal stenosis come aneurysm or vascular malformation. No missing branches are demonstrated. The  Both vertebral arteries are patent through the foramen magnum to the basilar. No basilar stenosis. Posterior circulation branch vessels appear normal.  No intracranial venous pathology. No intracranial hemorrhage or evidence of acute infarction. There is chronic calcification in the left cerebellum which appears to be associated with some abnormal vessels. This probably represents a developmental venous anomaly.  Review of the MIP images confirms the above findings.  CTA NECK FINDINGS  Lung apices are clear.  No superior mediastinal lesion.  The aortic arch shows atherosclerotic calcification but no aneurysm or dissection. The branching pattern of the  brachiocephalic vessels from the arch is normal. No origin stenoses.  The right common carotid artery is extremely tortuous but is widely patent to the bifurcation. The bifurcation is widely patent without stenosis or irregularity. The right internal carotid artery is smooth and of normal caliber.  The left common carotid artery is markedly tortuous but widely patent to the bifurcation. The carotid bifurcation does not show any stenosis or irregularity. The cervical internal carotid artery is tortuous but widely patent.  The right vertebral artery origin is widely patent. The left vertebral artery origin is probably widely patent, but not optimally seen because of venous reflux. Right vertebral artery is tortuous but widely patent beyond that. The left vertebral artery is tortuous. At the C2 and C3 level, there is either a fenestration or chronic dissection at the vertebral artery. There is no stenosis. The vessel appears widely patent beyond that.  Review of the MIP images confirms the above findings.  IMPRESSION: No intracranial occlusion or stenosis. Ordinary atherosclerotic calcification in the carotid siphon regions. 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  Tortuous vessels in the neck suggesting a history of chronic hypertension. No carotid bifurcation stenosis or irregularity.  Linear defect in the left vertebral artery at  the C2 and C3 level. This could represent a chronic fenestration or a chronic dissection. I suspected is probably incidental.   Electronically Signed   By: Nelson Chimes M.D.   On: 08/21/2013 15:01       Microbiology: No results found for this or any previous visit (from the past 240 hour(s)).   Labs: Results for orders placed during the hospital encounter of 08/21/13 (from the past 48 hour(s))  ETHANOL     Status: None   Collection Time    08/21/13  1:20 PM      Result Value Ref Range   Alcohol, Ethyl (B) <11  0 - 11 mg/dL   Comment:             LOWEST DETECTABLE LIMIT FOR     SERUM ALCOHOL IS 11 mg/dL     FOR MEDICAL PURPOSES ONLY  PROTIME-INR     Status: Abnormal   Collection Time    08/21/13  1:20 PM      Result Value Ref Range   Prothrombin Time 21.1 (*) 11.6 - 15.2 seconds   INR 1.82 (*) 0.00 - 1.49  APTT     Status: None   Collection Time    08/21/13  1:20 PM      Result Value Ref Range   aPTT 37  24 - 37 seconds   Comment:            IF BASELINE aPTT IS ELEVATED,     SUGGEST PATIENT RISK ASSESSMENT     BE USED TO DETERMINE APPROPRIATE     ANTICOAGULANT THERAPY.  CBC     Status: None   Collection Time    08/21/13  1:20 PM      Result Value Ref Range   WBC 8.7  4.0 - 10.5 K/uL   RBC 4.57  3.87 - 5.11 MIL/uL   Hemoglobin 14.3  12.0 - 15.0 g/dL   HCT 43.2  36.0 - 46.0 %   MCV 94.5  78.0 - 100.0 fL   MCH 31.3  26.0 - 34.0 pg   MCHC 33.1  30.0 - 36.0 g/dL   RDW 14.2  11.5 - 15.5 %   Platelets 335  150 - 400 K/uL  DIFFERENTIAL     Status: None   Collection Time    08/21/13  1:20 PM      Result Value Ref Range   Neutrophils Relative % 43  43 - 77 %   Neutro Abs 3.8  1.7 - 7.7 K/uL   Lymphocytes Relative 44  12 - 46 %   Lymphs Abs 3.8  0.7 - 4.0 K/uL   Monocytes Relative 11  3 - 12 %   Monocytes Absolute 0.9  0.1 - 1.0 K/uL   Eosinophils Relative 1  0 - 5 %   Eosinophils Absolute 0.1  0.0 - 0.7 K/uL   Basophils Relative 1  0 - 1 %   Basophils Absolute 0.1  0.0 - 0.1 K/uL  COMPREHENSIVE METABOLIC PANEL     Status: Abnormal   Collection Time    08/21/13  1:20 PM      Result Value Ref Range   Sodium 140  137 - 147 mEq/L   Potassium 4.2  3.7 - 5.3 mEq/L   Chloride 103  96 - 112 mEq/L   CO2 22  19 - 32 mEq/L   Glucose, Bld 100 (*) 70 - 99 mg/dL   BUN 13  6 - 23 mg/dL  Creatinine, Ser 0.87  0.50 - 1.10 mg/dL   Calcium 9.1  8.4 - 10.5 mg/dL   Total Protein 7.3  6.0 - 8.3 g/dL   Albumin 3.4 (*) 3.5 - 5.2 g/dL   AST 21  0 - 37 U/L   ALT 11  0 - 35 U/L   Alkaline Phosphatase 130 (*) 39 - 117 U/L   Total  Bilirubin 0.4  0.3 - 1.2 mg/dL   GFR calc non Af Amer 58 (*) >90 mL/min   GFR calc Af Amer 67 (*) >90 mL/min   Comment: (NOTE)     The eGFR has been calculated using the CKD EPI equation.     This calculation has not been validated in all clinical situations.     eGFR's persistently <90 mL/min signify possible Chronic Kidney     Disease.   Anion gap 15  5 - 15  I-STAT TROPOININ, ED     Status: None   Collection Time    08/21/13  1:40 PM      Result Value Ref Range   Troponin i, poc 0.00  0.00 - 0.08 ng/mL   Comment 3            Comment: Due to the release kinetics of cTnI,     a negative result within the first hours     of the onset of symptoms does not rule out     myocardial infarction with certainty.     If myocardial infarction is still suspected,     repeat the test at appropriate intervals.  I-STAT CHEM 8, ED     Status: Abnormal   Collection Time    08/21/13  1:42 PM      Result Value Ref Range   Sodium 141  137 - 147 mEq/L   Potassium 4.0  3.7 - 5.3 mEq/L   Chloride 105  96 - 112 mEq/L   BUN 13  6 - 23 mg/dL   Creatinine, Ser 0.90  0.50 - 1.10 mg/dL   Glucose, Bld 103 (*) 70 - 99 mg/dL   Calcium, Ion 1.13  1.13 - 1.30 mmol/L   TCO2 24  0 - 100 mmol/L   Hemoglobin 15.6 (*) 12.0 - 15.0 g/dL   HCT 46.0  36.0 - 46.0 %  CBG MONITORING, ED     Status: None   Collection Time    08/21/13  1:49 PM      Result Value Ref Range   Glucose-Capillary 93  70 - 99 mg/dL  PROTIME-INR     Status: Abnormal   Collection Time    08/21/13  4:37 PM      Result Value Ref Range   Prothrombin Time 21.9 (*) 11.6 - 15.2 seconds   INR 1.91 (*) 0.00 - 1.49  URINE RAPID DRUG SCREEN (HOSP PERFORMED)     Status: None   Collection Time    08/21/13  6:51 PM      Result Value Ref Range   Opiates NONE DETECTED  NONE DETECTED   Cocaine NONE DETECTED  NONE DETECTED   Benzodiazepines NONE DETECTED  NONE DETECTED   Amphetamines NONE DETECTED  NONE DETECTED   Tetrahydrocannabinol NONE DETECTED   NONE DETECTED   Barbiturates NONE DETECTED  NONE DETECTED   Comment:            DRUG SCREEN FOR MEDICAL PURPOSES     ONLY.  IF CONFIRMATION IS NEEDED     FOR ANY PURPOSE, NOTIFY LAB  WITHIN 5 DAYS.                LOWEST DETECTABLE LIMITS     FOR URINE DRUG SCREEN     Drug Class       Cutoff (ng/mL)     Amphetamine      1000     Barbiturate      200     Benzodiazepine   202     Tricyclics       542     Opiates          300     Cocaine          300     THC              50  URINALYSIS, ROUTINE W REFLEX MICROSCOPIC     Status: Abnormal   Collection Time    08/21/13  6:51 PM      Result Value Ref Range   Color, Urine YELLOW  YELLOW   APPearance CLEAR  CLEAR   Specific Gravity, Urine 1.042 (*) 1.005 - 1.030   pH 7.0  5.0 - 8.0   Glucose, UA NEGATIVE  NEGATIVE mg/dL   Hgb urine dipstick MODERATE (*) NEGATIVE   Bilirubin Urine NEGATIVE  NEGATIVE   Ketones, ur NEGATIVE  NEGATIVE mg/dL   Protein, ur NEGATIVE  NEGATIVE mg/dL   Urobilinogen, UA 0.2  0.0 - 1.0 mg/dL   Nitrite NEGATIVE  NEGATIVE   Leukocytes, UA NEGATIVE  NEGATIVE  URINE MICROSCOPIC-ADD ON     Status: Abnormal   Collection Time    08/21/13  6:51 PM      Result Value Ref Range   Squamous Epithelial / LPF FEW (*) RARE   WBC, UA 0-2  <3 WBC/hpf   RBC / HPF 7-10  <3 RBC/hpf   Bacteria, UA RARE  RARE  HEMOGLOBIN A1C     Status: Abnormal   Collection Time    08/22/13  5:44 AM      Result Value Ref Range   Hemoglobin A1C 6.2 (*) <5.7 %   Comment: (NOTE)                                                                               According to the ADA Clinical Practice Recommendations for 2011, when     HbA1c is used as a screening test:      >=6.5%   Diagnostic of Diabetes Mellitus               (if abnormal result is confirmed)     5.7-6.4%   Increased risk of developing Diabetes Mellitus     References:Diagnosis and Classification of Diabetes Mellitus,Diabetes     HCWC,3762,83(TDVVO 1):S62-S69 and Standards of  Medical Care in             Diabetes - 2011,Diabetes Care,2011,34 (Suppl 1):S11-S61.   Mean Plasma Glucose 131 (*) <117 mg/dL   Comment: Performed at [facility]     Status: Abnormal   Collection Time    08/22/13  5:44 AM      Result Value Ref Range   Cholesterol 207 (*) 0 -  200 mg/dL   Triglycerides 199 (*) <150 mg/dL   HDL 43  >39 mg/dL   Total CHOL/HDL Ratio 4.8     VLDL 40  0 - 40 mg/dL   LDL Cholesterol 124 (*) 0 - 99 mg/dL   Comment:            Total Cholesterol/HDL:CHD Risk     Coronary Heart Disease Risk Table                         Men   Women      1/2 Average Risk   3.4   3.3      Average Risk       5.0   4.4      2 X Average Risk   9.6   7.1      3 X Average Risk  23.4   11.0                Use the calculated Patient Ratio     above and the CHD Risk Table     to determine the patient's CHD Risk.                ATP III CLASSIFICATION (LDL):      <100     mg/dL   Optimal      100-129  mg/dL   Near or Above                        Optimal      130-159  mg/dL   Borderline      160-189  mg/dL   High      >190     mg/dL   Very High  TROPONIN I     Status: None   Collection Time    08/22/13  9:50 AM      Result Value Ref Range   Troponin I <0.30  <0.30 ng/mL   Comment:            Due to the release kinetics of cTnI,     a negative result within the first hours     of the onset of symptoms does not rule out     myocardial infarction with certainty.     If myocardial infarction is still suspected,     repeat the test at appropriate intervals.  TROPONIN I     Status: None   Collection Time    08/22/13  2:50 PM      Result Value Ref Range   Troponin I <0.30  <0.30 ng/mL   Comment:            Due to the release kinetics of cTnI,     a negative result within the first hours     of the onset of symptoms does not rule out     myocardial infarction with certainty.     If myocardial infarction is still suspected,     repeat the test at  appropriate intervals.  TROPONIN I     Status: None   Collection Time    08/22/13  7:20 PM      Result Value Ref Range   Troponin I <0.30  <0.30 ng/mL   Comment:            Due to the release kinetics of cTnI,     a negative result within the first hours     of the  onset of symptoms does not rule out     myocardial infarction with certainty.     If myocardial infarction is still suspected,     repeat the test at appropriate intervals.  BASIC METABOLIC PANEL     Status: Abnormal   Collection Time    08/23/13  7:30 AM      Result Value Ref Range   Sodium 143  137 - 147 mEq/L   Potassium 4.4  3.7 - 5.3 mEq/L   Chloride 102  96 - 112 mEq/L   CO2 25  19 - 32 mEq/L   Glucose, Bld 100 (*) 70 - 99 mg/dL   BUN 11  6 - 23 mg/dL   Creatinine, Ser 0.94  0.50 - 1.10 mg/dL   Calcium 9.2  8.4 - 10.5 mg/dL   GFR calc non Af Amer 53 (*) >90 mL/min   GFR calc Af Amer 61 (*) >90 mL/min   Comment: (NOTE)     The eGFR has been calculated using the CKD EPI equation.     This calculation has not been validated in all clinical situations.     eGFR's persistently <90 mL/min signify possible Chronic Kidney     Disease.   Anion gap 16 (*) 5 - 15  CBC     Status: None   Collection Time    08/23/13  7:30 AM      Result Value Ref Range   WBC 8.8  4.0 - 10.5 K/uL   RBC 4.54  3.87 - 5.11 MIL/uL   Hemoglobin 14.3  12.0 - 15.0 g/dL   HCT 43.0  36.0 - 46.0 %   MCV 94.7  78.0 - 100.0 fL   MCH 31.5  26.0 - 34.0 pg   MCHC 33.3  30.0 - 36.0 g/dL   RDW 14.4  11.5 - 15.5 %   Platelets 341  150 - 400 K/uL  PROTIME-INR     Status: Abnormal   Collection Time    08/23/13  7:30 AM      Result Value Ref Range   Prothrombin Time 23.2 (*) 11.6 - 15.2 seconds   INR 2.06 (*) 0.00 - 1.49  TSH     Status: None   Collection Time    08/23/13  9:39 AM      Result Value Ref Range   TSH 4.040  0.350 - 4.500 uIU/mL     HPI :* 80 y.o. female history of atrial fibrillation on Coumadin, hypertension and  hyperlipidemia who was brought to the emergency room following acute onset of left-sided weakness and numbness which started about 11:30 AM today. She has no previous history of stroke nor TIA. She had no changes in speech and no noticeable changes in mental status. CT scan of her head showed no acute intracranial abnormality. Subsequent CTA showed no intracranial large vessel occlusion. INR was 1.82. NIH stroke score was 7.  HOSPITAL COURSE:  Possible CVA presenting with left-sided weakness and numbness. Initial imaging does not confirm new stroke, however, she cannot have MRI due to pacer. She still has left arm symptoms. Suspect right brain embolic infarct secondary to known atrial fibrillation with subtherapeutic INR. However, there are functional component present, likely due to recent stress. On warfarin prior to admission; INR 1.82 on arrival, pt states she has been compliant. Now on warfarin for secondary stroke prevention given no sign of large ischemic stroke. Cannot have an MRI because of pacemaker .CT angiogram of the head and neck is negative  Hypertension. BP 122-182/53-137  Paroxysmal atrial fibrillation. EP recommends change to eliquis. Discussed with pt/family. On home bisoprolol and diltiazem. Hyperlipidemia, LDL 124, on no statin because of weakness related to Lipitor, will place the patient on simvastatin, goal LDL < 100 (< 70 for diabetics)  A1C 6.2, within the goal  Morbid Obesity, Body mass index is 40.04 kg/(m^2).  Stress related to recent death of her husband 2 weeks ago, may be exacerbating her stroke symptoms PT recommended CIR, rolling walker Speech therapy did not identify any current needs    However, pt reported that her INR was stable at therapeutic level at home, did not feel difficulty to control INR, get used to the life style while using coumadin and given her age, continue coumadin is also a good option. Had long discussion with pt and family regarding options on  anticoagulation, they are leaning towards coumadin. If change to NOAC, recommend eliquis 90m bid  Neurology recommended repeating the CAT scan on 7/24, CT shows Stable appearance of the head: Involutional changes, mild white  matter changes suggest chronic small vessel ischemic disease.  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  INR therapeutic at 2.06  Will need dosing per pharmacy protocol  Atrial fibrillation  INR subtherapeutic upon admission Seen by Dr. ARayann Heman pacemaker interrogated, pacemaker functioning normally, Given labile INR leading to stroke, cardiology would strongly advise consideration of a NOAC. recommend eliquis [id number] Patient prefers to stay on Coumadin for now Continue home bisoprolol and diltiazem dosing. Can titrate diltiazem if needed per cardiology. Goal heart rates should be mostly < 100 bpm Decreased diltiazem to 120 mg a day  hypertension , controlled currently Uncontrolled upon admission, on bisoprolol, diltiazem, Imdur  Decreased diltiazem to  120 mg a day    Discharge Exam: * Blood pressure 134/72, pulse 69, temperature 97.8 F (36.6 C), temperature source Oral, resp. rate 18, height 5' (1.524 m), weight 93 kg (205 lb 0.4 oz), SpO2 95.00%.  CARD: RRR; S1 and S2 appreciated; no murmurs, no clicks, no rubs, no gallops  RESP: Normal chest excursion without splinting or tachypnea; breath sounds clear and equal bilaterally; no wheezes, no rhonchi, no rales,  ABD/GI: Normal bowel sounds; non-distended; soft, non-tender, no rebound, no guarding  BACK: The back appears normal and is non-tender to palpation, there is no CVA tenderness  EXT: Normal ROM in all joints; non-tender to palpation; no edema; normal capillary refill; no cyanosis  SKIN: Normal color for age and race; warm  NEURO: Patient has left-sided upper and lower extremity weakness and pronator drift, almost no grip strength the left upper extremity, sensation to light touch intact diffusely, strength  5/5 in her right upper and lower extremity, cranial nerves II through XII intact, no slurred speech, NIH stroke scale 7        Discharge Instructions   Diet - low sodium heart healthy    Complete by:  As directed      Increase activity slowly    Complete by:  As directed            Follow-up Information   Schedule an appointment as soon as possible for a visit to follow up.      Follow up with HODGES,FRANCISCO, MD. Schedule an appointment as soon as possible for a visit in 1 week.   Specialty:  Family Medicine      Follow up with JaThompson GrayerMD. Schedule an appointment as soon as possible for a visit in 2 weeks.   Specialty:  Cardiology   Contact information:   [address] [place] [address] [phone]       Signed: Reyne Dumas 08/23/2013, 12:40 PM

## 2013-08-23 NOTE — Progress Notes (Signed)
Patient's daughter wants the lipitor d/c or changed to something else. MD paged about it.

## 2013-08-23 NOTE — Progress Notes (Signed)
Stroke Team Progress Note  HISTORY [name] is an 80 y.o. female history of atrial fibrillation on Coumadin, hypertension and hyperlipidemia who was brought to the emergency room following acute onset of left-sided weakness and numbness which started about 11:30 AM 08/21/2013. She has no previous history of stroke nor TIA. She had no changes in speech and no noticeable changes in mental status. CT scan of her head showed no acute intracranial abnormality. Subsequent CTA showed no intracranial large vessel occlusion. INR was 1.82. NIH stroke score was 7. Patient was not administered TPA secondary to INR 1.82. She was admitted for further evaluation and treatment.  SUBJECTIVE To family members at the bedside. The patient feels she is getting better. She states she has been intolerant to Lipitor in the past and Zocor was contraindicated with one of her other medications. Repeat INR within goal today.  Patient's husband died 2 weeks ago and she has a lot of stress lately. Her granddaughter is the Architect at [employer].   OBJECTIVE Most recent Vital Signs: Filed Vitals:   08/23/13 0617 08/23/13 1033 08/23/13 1444 08/23/13 1459  BP: 148/72 134/72 81/48 101/50  Pulse: 69 69 70   Temp: 98.2 F (36.8 C) 97.8 F (36.6 C) 98 F (36.7 C)   TempSrc: Oral Oral Oral   Resp: 18 18 18    Height:      Weight:      SpO2: 93% 95% 95%    CBG (last 3)   Recent Labs  08/21/13 1349  GLUCAP 93    IV Fluid Intake:     MEDICATIONS  .  stroke: mapping our early stages of recovery book   Does not apply Once  . bisoprolol  5 mg Oral Daily  . diltiazem  240 mg Oral Daily  . isosorbide mononitrate  30 mg Oral Daily  . pantoprazole  40 mg Oral Daily  . rOPINIRole  0.5 mg Oral QHS  . warfarin  4 mg Oral ONCE-1800  . Warfarin - Pharmacist Dosing Inpatient   Does not apply q1800   PRN:  acetaminophen, hydrALAZINE, senna-docusate  Diet:  Cardiac thin liquids Activity:  As tolerated DVT  Prophylaxis:  warfarin  CLINICALLY SIGNIFICANT STUDIES Basic Metabolic Panel:   Recent Labs Lab 08/21/13 1320 08/21/13 1342 08/23/13 0730  NA 140 141 143  K 4.2 4.0 4.4  CL 103 105 102  CO2 22  --  25  GLUCOSE 100* 103* 100*  BUN 13 13 11   CREATININE 0.87 0.90 0.94  CALCIUM 9.1  --  9.2   Liver Function Tests:   Recent Labs Lab 08/21/13 1320  AST 21  ALT 11  ALKPHOS 130*  BILITOT 0.4  PROT 7.3  ALBUMIN 3.4*   CBC:   Recent Labs Lab 08/21/13 1320 08/21/13 1342 08/23/13 0730  WBC 8.7  --  8.8  NEUTROABS 3.8  --   --   HGB 14.3 15.6* 14.3  HCT 43.2 46.0 43.0  MCV 94.5  --  94.7  PLT 335  --  341   Coagulation:   Recent Labs Lab 08/21/13 1320 08/21/13 1637 08/23/13 0730  LABPROT 21.1* 21.9* 23.2*  INR 1.82* 1.91* 2.06*   Cardiac Enzymes:   Recent Labs Lab 08/22/13 0950 08/22/13 1450 08/22/13 1920  TROPONINI <0.30 <0.30 <0.30   Urinalysis:   Recent Labs Lab 08/21/13 1851  COLORURINE YELLOW  LABSPEC 1.042*  PHURINE 7.0  GLUCOSEU NEGATIVE  HGBUR MODERATE*  BILIRUBINUR NEGATIVE  KETONESUR NEGATIVE  PROTEINUR NEGATIVE  UROBILINOGEN 0.2  NITRITE NEGATIVE  LEUKOCYTESUR NEGATIVE   Lipid Panel    Component Value Date/Time   CHOL 207* 08/22/2013 0544   TRIG 199* 08/22/2013 0544   HDL 43 08/22/2013 0544   CHOLHDL 4.8 08/22/2013 0544   VLDL 40 08/22/2013 0544   LDLCALC 124* 08/22/2013 0544   HgbA1C  Lab Results  Component Value Date   HGBA1C 6.2* 08/22/2013    Urine Drug Screen:     Component Value Date/Time   LABOPIA NONE DETECTED 08/21/2013 1851   COCAINSCRNUR NONE DETECTED 08/21/2013 1851   LABBENZ NONE DETECTED 08/21/2013 1851   AMPHETMU NONE DETECTED 08/21/2013 1851   THCU NONE DETECTED 08/21/2013 1851   LABBARB NONE DETECTED 08/21/2013 1851    Alcohol Level:   Recent Labs Lab 08/21/13 1320  ETH <11    CT of the brain  08/21/2013   1. No acute intracranial abnormalities.  No intracranial hemorrhage. 2. Stable atrophy and mild  chronic microvascular ischemic change.   CT of the brain 08/23/2013 No acute intracranial process.  Stable appearance of the head: Involutional changes, mild white matter changes suggest chronic small vessel ischemic disease.  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  CT Angio Head & Neck 08/21/2013    No intracranial occlusion or stenosis. Ordinary atherosclerotic calcification in the carotid siphon regions. 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  Tortuous vessels in the neck suggesting a history of chronic hypertension. No carotid bifurcation stenosis or irregularity.  Linear defect in the left vertebral artery at the C2 and C3 level. This could represent a chronic fenestration or a chronic dissection. I suspected is probably incidental.    MRI - not able to do due to pacer  2D Echocardiogram   - Left ventricle: The cavity size was normal. Wall thickness was normal. Systolic function was normal. The estimated ejection fraction was in the range of 55% to 60%. - Mitral valve: There was mild regurgitation. - Left atrium: The appendage was moderately dilated. - Right atrium: The atrium was moderately dilated. - Atrial septum: No defect or patent foramen ovale was identified. - Line: A venous catheter was visualized in the superior vena cava, with its tip in the right atrium. No abnormal features noted.  CXR  08/21/2013   No active cardiopulmonary disease. Large hiatal hernia. No interval change.  EKG  atrial fibrillation, rate 77. For complete results please see formal report.   Therapy Recommendations - inpatient rehabilitation recommended  Physical Exam    Temp:  [96.8 F (36 C)-98.3 F (36.8 C)] 98 F (36.7 C) (07/24 1444) Pulse Rate:  [63-78] 70 (07/24 1444) Resp:  [16-18] 18 (07/24 1444) BP: (64-148)/(32-72) 101/50 mmHg (07/24 1459) SpO2:  [93 %-95 %] 95 % (07/24 1444)  General - Well nourished, well developed, in no apparent  distress.  Ophthalmologic - Sharp disc margins OU.  Cardiovascular - irregularly irregular heart rhythm and rate.  Mental Status -  Level of arousal and orientation to time, place, and person were intact. Language including expression, naming, repetition, comprehension, reading, and writing was assessed and found intact.  Cranial Nerves II - XII - II - Vision intact OU. III, IV, VI - Extraocular movements intact. V - Facial sensation intact bilaterally. VII - Facial movement intact bilaterally. VIII - Hearing & vestibular intact bilaterally. X - Palate elevates symmetrically. XI - Chin turning & shoulder shrug intact bilaterally. XII - Tongue protrusion intact.  Motor Strength - The patient's strength was 4/5 LUE and LLE, however,  there were significant give-away weakness present. Bulk was normal and fasciculations were absent.   Motor Tone - Muscle tone was assessed at the neck and appendages and was normal.  Reflexes - The patient's reflexes were normal in all extremities and she had no pathological reflexes.  Sensory - Light touch, temperature/pinprick were assessed and were decreased on the left side.    Coordination - The patient had normal movements in the hands and feet with no ataxia or dysmetria.  Tremor was absent.  Gait and Station - not tested due to safety.   ASSESSMENT [name] is a 80 y.o. female presenting with left-sided weakness and numbness. Initial imaging does not confirm new stroke, however, she cannot have MRI due to pacer. She still has left arm symptoms. Suspect right brain embolic infarct secondary to known atrial fibrillation with subtherapeutic INR.  However, there are functional component present, likely due to recent stress. On warfarin prior to admission; INR 1.82 on arrival, pt states she has been compliant. Now on warfarin for secondary stroke prevention given no sign of large ischemic stroke. INR this am is 2.04 within goal  range.   Hypertension. BP 122-182/53-137  Paroxysmal atrial fibrillation. Discussed with pt/family and will continue coumadin. On home bisoprolol and diltiazem. Hyperlipidemia, LDL 124, on no statin PTA due to myalgia with lipitor and drug-drug interaction with simvastatin. Recommend pravastatin 40mg  dialy (not ordered yet), goal LDL < 100 (< 70 for diabetics) A1C 6.2, within the goal Morbid Obesity, Body mass index is 40.04 kg/(m^2).  Stress related to recent death of her husband 2 weeks ago, may be exacerbating her stroke symptoms Warfarin therapy - INR 2.06 today  Hospital day # 2  TREATMENT/PLAN  Cardiology recommend NOAC. However, pt reported that her INR was stable at therapeutic level at home, did not feel difficulty to control INR, get used to the life style while using coumadin and given her age, continue coumadin is also a good option. Had long discussion with pt and family regarding options on anticoagulation, they like to continue coumadin.    A1C 6.2, within the goal < 6.5  LDL high at 124, however, pt not tolerate with lipitor or zocor, will recommend pravastatin (not ordered yet)  Therapy evals - CIR recommended - rehabilitation M.D. consult agrees  Significant stress lately with functional component on exam, continue in need of social support.  Repeat CT head - no acute intracranial process.  The stroke team will sign off at this time. Please call but can be of further service.  Followup with Dr in 2 months  Roda Shutters PA-C [facility] Pager [phone] 08/23/2013, 4:12 PM  SIGNED I, the attending vascular neurologist, have personally obtained a history, examined the patient, evaluated laboratory data, individually viewed imaging studies, and formulated the assessment and plan of care.  I have made any additions or clarifications directly to the above note and agree with the findings and plan as currently documented.   08/25/2013, MD  PhD 08/23/2013 7:04 PM  To contact Stroke Continuity provider, please refer to 08/25/2013. After hours, contact General Neurology

## 2013-08-23 NOTE — Evaluation (Signed)
Occupational Therapy Evaluation Patient Details Name: [name] MRN: [id number] DOB: 1923/10/07 Today's Date: 08/23/2013    History of Present Illness Admitted with L upper and lower extremity numbness and weakness.  PMHx--afib, pacer   Clinical Impression   PT admitted with Lt side numbness and weakness. Pt with pacer maker limiting MRI scans.. Pt currently with functional limitiations due to the deficits listed below (see OT problem list). PTA pt was independent and driving.  Pt will benefit from skilled OT to increase their independence and safety with adls and balance to allow discharge CIR. Ot to follow acutely for adls, dynamic balance and LT UE strengthening.      Follow Up Recommendations  CIR    Equipment Recommendations  None recommended by OT    Recommendations for Other Services Rehab consult     Precautions / Restrictions Precautions Precautions: Fall Restrictions Weight Bearing Restrictions: No      Mobility Bed Mobility Overal bed mobility: Needs Assistance Bed Mobility: Supine to Sit     Supine to sit: HOB elevated;Min assist     General bed mobility comments: in chair on arrival  Transfers Overall transfer level: Needs assistance Equipment used: Rolling walker (2 wheeled) Transfers: Sit to/from Stand Sit to Stand: Min assist         General transfer comment: Pt required (A) with anterior weight shift and to power up into static standing. pt using BIL UE and unable to complete task without (A). Pt and family report transfers were becomign harder prior to admission    Balance Overall balance assessment: Needs assistance Sitting-balance support: No upper extremity supported;Feet supported Sitting balance-Leahy Scale: Good Sitting balance - Comments: denied any dizziness; tolerated sitting EOB ~5 min for exercises and no UE support    Standing balance support: Bilateral upper extremity supported;During functional activity Standing  balance-Leahy Scale: Poor                              ADL Overall ADL's : Needs assistance/impaired     Grooming: Wash/dry face;Oral care;Min guard Grooming Details (indicate cue type and reason): Pt leaning against counter surface for balance for bIL UE use to clean dentures Upper Body Bathing: Min guard;Standing   Lower Body Bathing: Minimal assistance;Sit to/from stand   Upper Body Dressing : Min guard;Sitting   Lower Body Dressing: Minimal assistance;Sit to/from stand Lower Body Dressing Details (indicate cue type and reason): Pt requires (A) for sit<> stand and to place pad in underwear Toilet Transfer: Minimal assistance           Functional mobility during ADLs: Min guard;Rolling walker General ADL Comments: Pt noted to have Lt LE dragging and shuffled step compared to the right side. Pt reports pain on the right side and decr awareness of LT deficit. tp with sensation changes throughout LT side face hand arm and leg. Pt with incr fall risk with turning to the left side.      Vision                 Additional Comments: Pt reports pending cataract eye surg next week scheduled. Family and pt advised if surgery occurrs that incr (A) to decr fall risk will be required. Discussed vision changes and balance changes with LT sided weakness. Pt occluding Rt eye and reports black color and only seeing shadow of therapist. Pt occluding lt eye and reports seeing therapist and color purple   Perception  Praxis      Pertinent Vitals/Pain VSS      Hand Dominance Right   Extremity/Trunk Assessment Upper Extremity Assessment Upper Extremity Assessment: LUE deficits/detail LUE Deficits / Details: grasp 3 out 5, shoulder flexion AROM 3- out 5. Pt unable to sustain shoulder flexion LUE Sensation: decreased light touch LUE Coordination: decreased fine motor;decreased gross motor   Lower Extremity Assessment Lower Extremity Assessment: Defer to PT evaluation    Cervical / Trunk Assessment Cervical / Trunk Assessment: Other exceptions Cervical / Trunk Exceptions: core weakness   Communication Communication Communication: No difficulties   Cognition Arousal/Alertness: Awake/alert Behavior During Therapy: WFL for tasks assessed/performed Overall Cognitive Status: Within Functional Limits for tasks assessed                     General Comments       Exercises       Shoulder Instructions      Home Living Family/patient expects to be discharged to:: Private residence Living Arrangements: Other (Comment) Available Help at Discharge: Family;Available 24 hours/day Type of Home: House Home Access: Stairs to enter [employer] of Steps: 2 Entrance Stairs-Rails: Right;Left Home Layout: One level     Bathroom Shower/Tub: Chief Strategy Officer: Standard                Prior Functioning/Environment Level of Independence: Independent        Comments: reports has been driving with cataracts. Pt awaiting license renewal in August and Rt eye cataract surgery    OT Diagnosis: Generalized weakness;Paresis   OT Problem List: Decreased strength;Decreased activity tolerance;Impaired balance (sitting and/or standing);Decreased safety awareness;Decreased knowledge of use of DME or AE;Decreased knowledge of precautions;Impaired UE functional use;Impaired sensation   OT Treatment/Interventions: Self-care/ADL training;Therapeutic exercise;Neuromuscular education;DME and/or AE instruction;Therapeutic activities;Patient/family education;Balance training    OT Goals(Current goals can be found in the care plan section) Acute Rehab OT Goals Patient Stated Goal: to get my drivers license OT Goal Formulation: With patient/family Time For Goal Achievement: 09/06/13 Potential to Achieve Goals: Good  OT Frequency: Min 2X/week   Barriers to D/C:            Co-evaluation              End of Session Equipment  Utilized During Treatment: Rolling walker Nurse Communication: Mobility status;Precautions  Activity Tolerance: Patient tolerated treatment well Patient left: in chair;with call bell/phone within reach;with family/visitor present;with chair alarm set   Time: 1112-1140 OT Time Calculation (min): 28 min Charges:  OT General Charges $OT Visit: 1 Procedure OT Evaluation $Initial OT Evaluation Tier I: 1 Procedure OT Treatments $Self Care/Home Management : 8-22 mins G-Codes:    Boone Master B Sep 22, 2013, 12:01 PM Pager: [phone]

## 2013-08-23 NOTE — Consult Note (Signed)
Physical Medicine and Rehabilitation Consult Reason for Consult: CVA Referring Physician: [facility]   HPI: [name] is a 80 y.o. right-handed female with history of hypertension, atrial fibrillation with pacemaker and chronic Coumadin therapy. Independent prior to admission living with her granddaughter. Admitted 08/21/2013 with left-sided weakness. Cranial CT scan negative. MRI not completed secondary to pacemaker. Patient did not receive TPA. INR on admission of 1.82. CT angiogram head and neck negative for stenosis or occlusion. Echocardiogram with ejection fraction of 60% without emboli. Troponin negative. Neurology services consulted suspect right brain MR infarction secondary to atrial fibrillation not identified on cranial CT scan. Patient remains on chronic Coumadin therapy. Physical therapy evaluation completed 08/22/2013 with recommendations for physical medicine rehabilitation consult.   Review of Systems  Respiratory:       Shortness of breath on exertion  Cardiovascular: Positive for palpitations.  Gastrointestinal:       GERD  Musculoskeletal: Positive for joint pain.  All other systems reviewed and are negative.  Past Medical History  Diagnosis Date  . Paroxysmal atrial fibrillation     tachy-brady syndrome  . Hypertension   . Hyperlipidemia   . GERD (gastroesophageal reflux disease)   . Chest pain     with cath 2002 and 2004 showing mild non-obstructive CAD  . Osteoporosis   . Dyspnea     chronic; myoview 11/9. EF 81% probable soft-tissue attenuation in anterior and lat walls, can't exclude ischemia  . Obesity    Past Surgical History  Procedure Laterality Date  . Cardiac catheterization    . Breast lumpectomy      right  . Total abdominal hysterectomy w/ bilateral salpingoophorectomy  1974  . Colectomy  1991  . Pacemaker insertion      [employer]   Family History  Problem Relation Age of Onset  . Cancer Other   . Heart disease Other   .  Hypertension Other    Social History:  reports that she has never smoked. She does not have any smokeless tobacco history on file. She reports that she does not drink alcohol or use illicit drugs. Allergies:  Allergies  Allergen Reactions  . Morphine And Related Other (See Comments)    hallucinations   Medications Prior to Admission  Medication Sig Dispense Refill  . bisoprolol (ZEBETA) 10 MG tablet Take 10 mg by mouth 2 (two) times daily.      . calcium carbonate (TUMS) 500 MG chewable tablet Chew 1 tablet by mouth daily as needed for indigestion or heartburn.      . diltiazem (CARDIZEM CD) 120 MG 24 hr capsule Take 120 mg by mouth 2 (two) times daily.       . furosemide (LASIX) 20 MG tablet Take 40 mg by mouth daily.      . isosorbide mononitrate (IMDUR) 30 MG 24 hr tablet Take 30 mg by mouth daily.      [name] omeprazole (PRILOSEC) 20 MG capsule Take 20 mg by mouth daily.        . potassium chloride SA (K-DUR,KLOR-CON) 20 MEQ tablet Take 1 tablet (20 mEq total) by mouth daily.  30 tablet  6  . rOPINIRole (REQUIP) 0.5 MG tablet Take 0.5 mg by mouth at bedtime.      [name] warfarin (COUMADIN) 3 MG tablet Take 3 mg by mouth daily.         Home: Home Living Family/patient expects to be discharged to:: Private residence Living Arrangements: Other (Comment) (great granddaughter  there most of the time) Available Help at Discharge: Family;Available 24 hours/day Type of Home: House Home Access: Stairs to enter [employer] of Steps: 2 Entrance Stairs-Rails: Right;Left Home Layout: One level Home Equipment: Other (comment) (TBA)  Functional History: Prior Function Level of Independence: Independent Comments: Could drive and run errands if not done for her by family Functional Status:  Mobility: Bed Mobility Overal bed mobility: Needs Assistance Bed Mobility: Supine to Sit Supine to sit: Min assist General bed mobility comments: cues for sequencing; truncal  assist Transfers Overall transfer level: Needs assistance Equipment used: Rolling walker (2 wheeled) Transfers: Sit to/from [employer] Sit to Stand: Min assist Stand pivot transfers: Min assist General transfer comment: cues for hand placement and steady assist, elevated surface Ambulation/Gait Ambulation/Gait assistance: Min assist;Mod assist Ambulation Distance (Feet): 15 Feet (to/from bathroom; after rest 45 feet into hall and back) Assistive device: Rolling walker (2 wheeled) Gait Pattern/deviations: Step-through pattern;Decreased step length - right;Decreased stance time - left;Decreased stride length;Trunk flexed Gait velocity: slow General Gait Details: unequal step length, with mildly flexing knee with WBing.    ADL:    Cognition: Cognition Overall Cognitive Status: Within Functional Limits for tasks assessed Orientation Level: Oriented X4 Cognition Arousal/Alertness: Awake/alert Behavior During Therapy: WFL for tasks assessed/performed Overall Cognitive Status: Within Functional Limits for tasks assessed  Blood pressure 148/72, pulse 69, temperature 98.2 F (36.8 C), temperature source Oral, resp. rate 18, height 5' (1.524 m), weight 93 kg (205 lb 0.4 oz), SpO2 93.00%. Physical Exam  Constitutional: She is oriented to person, place, and time.  HENT:  Head: Normocephalic.  Eyes: EOM are normal.  Neck: Normal range of motion. Neck supple. No thyromegaly present.  Cardiovascular:  Cardiac rate controlled  Respiratory: Effort normal and breath sounds normal. No respiratory distress.  GI: Soft. Bowel sounds are normal. She exhibits no distension.  Neurological: She is alert and oriented to person, place, and time.  Follows full commands. No facial asymmetries. Left UE and LE sensory 1/2. Left pronator drift. LUE: deltoid 2+, Bicep, 3, tricep 3, grip 3 to 3+. LLE: HF 1+ to 2-, KE 2-, ankle 2- with pf,df. Reasonable insight and awareness. Good historian.    Skin: Skin is warm and dry.    Results for orders placed during the hospital encounter of 08/21/13 (from the past 24 hour(s))  TROPONIN I     Status: None   Collection Time    08/22/13  9:50 AM      Result Value Ref Range   Troponin I <0.30  <0.30 ng/mL  TROPONIN I     Status: None   Collection Time    08/22/13  2:50 PM      Result Value Ref Range   Troponin I <0.30  <0.30 ng/mL  TROPONIN I     Status: None   Collection Time    08/22/13  7:20 PM      Result Value Ref Range   Troponin I <0.30  <0.30 ng/mL   Ct Angio Head W/cm &/or Wo Cm  08/21/2013   CLINICAL DATA:  Weakness of the left side affecting the arm and leg  EXAM: CT ANGIOGRAPHY HEAD AND NECK  TECHNIQUE: Multidetector CT imaging of the head and neck was performed using the standard protocol during bolus administration of intravenous contrast. Multiplanar CT image reconstructions and MIPs were obtained to evaluate the vascular anatomy. Carotid stenosis measurements (when applicable) are obtained utilizing NASCET criteria, using the distal internal carotid diameter as the  denominator.  CONTRAST:  80mL OMNIPAQUE IOHEXOL 350 MG/ML SOLN  COMPARISON:  Head CT same day  FINDINGS: CTA HEAD FINDINGS  Both internal carotid arteries show calcification in the siphon regions but there is no evidence of flow-limiting stenosis. The supra clinoid internal carotid arteries are widely patent. The anterior and middle cerebral arteries are patent without proximal stenosis come aneurysm or vascular malformation. No missing branches are demonstrated. The  Both vertebral arteries are patent through the foramen magnum to the basilar. No basilar stenosis. Posterior circulation branch vessels appear normal.  No intracranial venous pathology. No intracranial hemorrhage or evidence of acute infarction. There is chronic calcification in the left cerebellum which appears to be associated with some abnormal vessels. This probably represents a developmental venous  anomaly.  Review of the MIP images confirms the above findings.  CTA NECK FINDINGS  Lung apices are clear.  No superior mediastinal lesion.  The aortic arch shows atherosclerotic calcification but no aneurysm or dissection. The branching pattern of the brachiocephalic vessels from the arch is normal. No origin stenoses.  The right common carotid artery is extremely tortuous but is widely patent to the bifurcation. The bifurcation is widely patent without stenosis or irregularity. The right internal carotid artery is smooth and of normal caliber.  The left common carotid artery is markedly tortuous but widely patent to the bifurcation. The carotid bifurcation does not show any stenosis or irregularity. The cervical internal carotid artery is tortuous but widely patent.  The right vertebral artery origin is widely patent. The left vertebral artery origin is probably widely patent, but not optimally seen because of venous reflux. Right vertebral artery is tortuous but widely patent beyond that. The left vertebral artery is tortuous. At the C2 and C3 level, there is either a fenestration or chronic dissection at the vertebral artery. There is no stenosis. The vessel appears widely patent beyond that.  Review of the MIP images confirms the above findings.  IMPRESSION: No intracranial occlusion or stenosis. Ordinary atherosclerotic calcification in the carotid siphon regions. 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  Tortuous vessels in the neck suggesting a history of chronic hypertension. No carotid bifurcation stenosis or irregularity.  Linear defect in the left vertebral artery at the C2 and C3 level. This could represent a chronic fenestration or a chronic dissection. I suspected is probably incidental.   Electronically Signed   By: Paulina Fusi M.D.   On: 08/21/2013 15:01   Dg Chest 2 View  08/21/2013   CLINICAL DATA:  Short of breath.  EXAM: CHEST  2 VIEW  COMPARISON:  04/22/2013.   FINDINGS: Apical lordotic projection. Large hiatal hernia. Cardiopericardial silhouette appears similar to the prior exam allowing for differences in projection. There is no airspace disease. No pleural effusion is identified. Thoracic kyphosis is present. Two lead LEFT subclavian cardiac pacemaker. Monitoring leads project over the chest.  IMPRESSION: No active cardiopulmonary disease. Large hiatal hernia. No interval change.   Electronically Signed   By: Andreas Newport M.D.   On: 08/21/2013 20:39   Ct Head Wo Contrast  08/23/2013   CLINICAL DATA:  Follow-up of stroke.  EXAM: CT HEAD WITHOUT CONTRAST  TECHNIQUE: Contiguous axial images were obtained from the base of the skull through the vertex without intravenous contrast.  COMPARISON:  CT of the head August 21, 2013  FINDINGS: The ventricles and sulci are normal for age. No intraparenchymal hemorrhage, mass effect nor midline shift. Patchy supratentorial white matter hypodensities are within  normal range for patient's age and though non-specific suggest sequelae of chronic small vessel ischemic disease. No acute large vascular territory infarcts.  No abnormal extra-axial fluid collections. Dystrophic calcification left cerebellum likely reflects cavernoma. Basal cisterns are patent. Moderate calcific atherosclerosis of the carotid siphons. Slightly dense appearance of the basilar tip likely reflecting atherosclerosis.  No skull fracture. The included ocular globes and orbital contents are non-suspicious. The mastoid aircells and included paranasal sinuses are well-aerated.  IMPRESSION: No acute intracranial process.  Stable appearance of the head: Involutional changes, mild white matter changes suggest chronic small vessel ischemic disease. 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   Electronically Signed   By: Awilda Metroourtnay  Bloomer   On: 08/23/2013 02:01   Ct Head Wo Contrast  08/21/2013   CLINICAL DATA:  Abnormal sensation and weakness on left side involving the arm  and leg.  EXAM: CT HEAD WITHOUT CONTRAST  TECHNIQUE: Contiguous axial images were obtained from the base of the skull through the vertex without intravenous contrast.  COMPARISON:  04/22/2013  FINDINGS: Ventricles are normal in configuration. There is ventricular enlargement, greater than sulcal enlargement, consistent with predominantly central atrophy. This is stable. There is no hydrocephalus.  No parenchymal masses or mass effect. There is no evidence of a recent cortical infarct. Mild white matter hypoattenuation is noted most consistent with chronic microvascular ischemic change. Stable calcification in the left cerebellum.  No extra-axial masses or abnormal fluid collections.  There is no intracranial hemorrhage.  Visualized sinuses and mastoid air cells are clear.  IMPRESSION: 1. No acute intracranial abnormalities.  No intracranial hemorrhage. 2. Stable atrophy and mild chronic microvascular ischemic change. These results were called by telephone at the time of interpretation on 08/21/2013 at 1:46 pm to Dr. Roseanne RenoSTEWART , who verbally acknowledged these results.   Electronically Signed   By: Amie Portlandavid  Ormond M.D.   On: 08/21/2013 13:47   Ct Angio Neck W/cm &/or Wo/cm  08/21/2013   CLINICAL DATA:  Weakness of the left side affecting the arm and leg  EXAM: CT ANGIOGRAPHY HEAD AND NECK  TECHNIQUE: Multidetector CT imaging of the head and neck was performed using the standard protocol during bolus administration of intravenous contrast. Multiplanar CT image reconstructions and MIPs were obtained to evaluate the vascular anatomy. Carotid stenosis measurements (when applicable) are obtained utilizing NASCET criteria, using the distal internal carotid diameter as the denominator.  CONTRAST:  80mL OMNIPAQUE IOHEXOL 350 MG/ML SOLN  COMPARISON:  Head CT same day  FINDINGS: CTA HEAD FINDINGS  Both internal carotid arteries show calcification in the siphon regions but there is no evidence of flow-limiting stenosis. The supra  clinoid internal carotid arteries are widely patent. The anterior and middle cerebral arteries are patent without proximal stenosis come aneurysm or vascular malformation. No missing branches are demonstrated. The  Both vertebral arteries are patent through the foramen magnum to the basilar. No basilar stenosis. Posterior circulation branch vessels appear normal.  No intracranial venous pathology. No intracranial hemorrhage or evidence of acute infarction. There is chronic calcification in the left cerebellum which appears to be associated with some abnormal vessels. This probably represents a developmental venous anomaly.  Review of the MIP images confirms the above findings.  CTA NECK FINDINGS  Lung apices are clear.  No superior mediastinal lesion.  The aortic arch shows atherosclerotic calcification but no aneurysm or dissection. The branching pattern of the brachiocephalic vessels from the arch is normal. No origin stenoses.  The right common carotid artery is extremely tortuous but  is widely patent to the bifurcation. The bifurcation is widely patent without stenosis or irregularity. The right internal carotid artery is smooth and of normal caliber.  The left common carotid artery is markedly tortuous but widely patent to the bifurcation. The carotid bifurcation does not show any stenosis or irregularity. The cervical internal carotid artery is tortuous but widely patent.  The right vertebral artery origin is widely patent. The left vertebral artery origin is probably widely patent, but not optimally seen because of venous reflux. Right vertebral artery is tortuous but widely patent beyond that. The left vertebral artery is tortuous. At the C2 and C3 level, there is either a fenestration or chronic dissection at the vertebral artery. There is no stenosis. The vessel appears widely patent beyond that.  Review of the MIP images confirms the above findings.  IMPRESSION: No intracranial occlusion or stenosis.  Ordinary atherosclerotic calcification in the carotid siphon regions. 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  Tortuous vessels in the neck suggesting a history of chronic hypertension. No carotid bifurcation stenosis or irregularity.  Linear defect in the left vertebral artery at the C2 and C3 level. This could represent a chronic fenestration or a chronic dissection. I suspected is probably incidental.   Electronically Signed   By: Paulina Fusi M.D.   On: 08/21/2013 15:01    Assessment/Plan: Diagnosis: right brain infarct, likely small/subcortical 1. Does the need for close, 24 hr/day medical supervision in concert with the patient's rehab needs make it unreasonable for this patient to be served in a less intensive setting? Yes 2. Co-Morbidities requiring supervision/potential complications: chronic left shoulder pain, CKD, htn, SSS, afib 3. Due to bladder management, bowel management, safety, skin/wound care, disease management, medication administration, pain management and patient education, does the patient require 24 hr/day rehab nursing? Yes 4. Does the patient require coordinated care of a physician, rehab nurse, PT (1-2 hrs/day, 5 days/week) and OT (1-2 hrs/day, 5 days/week) to address physical and functional deficits in the context of the above medical diagnosis(es)? Yes Addressing deficits in the following areas: balance, endurance, locomotion, strength, transferring, bowel/bladder control, bathing, dressing, feeding, grooming, toileting and psychosocial support 5. Can the patient actively participate in an intensive therapy program of at least 3 hrs of therapy per day at least 5 days per week? Yes 6. The potential for patient to make measurable gains while on inpatient rehab is excellent 7. Anticipated functional outcomes upon discharge from inpatient rehab are modified independent  with PT, modified independent with OT, n/a with SLP. 8. Estimated rehab  length of stay to reach the above functional goals is: 7-9 days 9. Does the patient have adequate social supports to accommodate these discharge functional goals? Yes 10. Anticipated D/C setting: Home 11. Anticipated post D/C treatments: HH therapy and Outpatient therapy 12. Overall Rehab/Functional Prognosis: excellent  RECOMMENDATIONS: This patient's condition is appropriate for continued rehabilitative care in the following setting: CIR Patient has agreed to participate in recommended program. Yes Note that insurance prior authorization may be required for reimbursement for recommended care.  Comment: Pt is active and independent PTA. Would do well in our program. Rehab Admissions Coordinator to follow up.  Thanks,  Ranelle Oyster, MD, [place] [facility]     08/23/2013

## 2013-08-23 NOTE — Clinical Social Work Psychosocial (Signed)
Clinical Social Work Department BRIEF PSYCHOSOCIAL ASSESSMENT 08/23/2013  Patient:  [name], [name]     Account Number:  [id number]     Admit date:  08/21/2013  Clinical Social Worker:  Delrae Sawyers  Date/Time:  08/23/2013 02:02 PM  Referred by:  Physician  Date Referred:  08/23/2013 Referred for  SNF Placement   Other Referral:   none.   Interview type:  Patient Other interview type:   Pt's family was also present at bedside.    PSYCHOSOCIAL DATA Living Status:  FAMILY Admitted from facility:   Level of care:   Primary support name:  Estill Bakes Primary support relationship to patient:  CHILD, ADULT Degree of support available:   Strong support system. Pt reports an abundance of family support.    CURRENT CONCERNS Current Concerns  Post-Acute Placement   Other Concerns:   none.    SOCIAL WORK ASSESSMENT / PLAN CSW met with pt at bedside for SNF as a back-up option to CIR. Pt and pt's family stated pt currently lives with her daughter and great-daughter, and is surrounded by the support of her family. Pt states her first choice is CIR, but is open to [facility].    CSW to continue to follow and assist with discharge planning needs.   Assessment/plan status:  Psychosocial Support/Ongoing Assessment of Needs Other assessment/ plan:   none.   Information/referral to community resources:   [facility] bed offers.    PATIENT'S/FAMILY'S RESPONSE TO PLAN OF CARE: Pt and pt's family understanding and agreeable to CSW plan of care. Pt and pt's family had no further questions or concerns at this time.       Lubertha Sayres, MSW, [facility] Licensed Clinical Social Worker [phone] and [phone]

## 2013-08-23 NOTE — Progress Notes (Signed)
Physical Therapy Treatment Patient Details Name: [name] MRN: [id number] DOB: [birth year] Today's Date: 08/23/2013    History of Present Illness Admitted with L upper and lower extremity numbness and weakness.  PMHx--afib, pacer    PT Comments    Pt progressing mobility. Continues to have gt abnormalities secondary to Lt sided weakness and numbness. Pt cont to have hemiparesis on Lt side. Continues to be great candidate for CIR.     Follow Up Recommendations  CIR     Equipment Recommendations  Rolling walker with 5" wheels    Recommendations for Other Services Rehab consult     Precautions / Restrictions Precautions Precautions: Fall Restrictions Weight Bearing Restrictions: No    Mobility  Bed Mobility Overal bed mobility: Needs Assistance Bed Mobility: Supine to Sit     Supine to sit: HOB elevated;Min assist     General bed mobility comments: in chair on arrival  Transfers Overall transfer level: Needs assistance Equipment used: Rolling walker (2 wheeled) Transfers: Sit to/from Stand Sit to Stand: Min assist         General transfer comment: Pt required (A) with anterior weight shift and to power up into static standing. pt using BIL UE and unable to complete task without (A). Pt and family report transfers were becomign harder prior to admission  Ambulation/Gait Ambulation/Gait assistance: Min assist Ambulation Distance (Feet): 30 Feet Assistive device: Rolling walker (2 wheeled) Gait Pattern/deviations: Decreased stance time - left;Decreased step length - right;Wide base of support;Trunk flexed;Staggering left Gait velocity: slow Gait velocity interpretation: Below normal speed for age/gender General Gait Details: pt required min (A) to maintain balance and manage RW; pt with difficulty advancing Lt LE due to hip flexor weakness; fatigues very quickly due to deconditioning    Stairs            Wheelchair Mobility    Modified Rankin  (Stroke Patients Only) Modified Rankin (Stroke Patients Only) Pre-Morbid Rankin Score: No symptoms Modified Rankin: Moderately severe disability     Balance Overall balance assessment: Needs assistance Sitting-balance support: No upper extremity supported;Feet supported Sitting balance-Leahy Scale: Good Sitting balance - Comments: denied any dizziness; tolerated sitting EOB ~5 min for exercises and no UE support    Standing balance support: Bilateral upper extremity supported;During functional activity Standing balance-Leahy Scale: Poor Standing balance comment: UEs supported by RW                     Cognition Arousal/Alertness: Awake/alert Behavior During Therapy: WFL for tasks assessed/performed Overall Cognitive Status: Within Functional Limits for tasks assessed                      Exercises General Exercises - Lower Extremity Ankle Circles/Pumps: AROM;Both;10 reps;Seated Long Arc Quad: AROM;Both;Strengthening;10 reps;Seated Heel Slides: AROM;Strengthening;Both;5 reps;Supine Hip Flexion/Marching: AROM;Strengthening;Both;10 reps;Seated    General Comments General comments (skin integrity, edema, etc.): difficulty assessing vision due to cataracts       Pertinent Vitals/Pain Denies any pain     Home Living Family/patient expects to be discharged to:: Private residence Living Arrangements: Other (Comment) Available Help at Discharge: Family;Available 24 hours/day Type of Home: House Home Access: Stairs to enter Entrance Stairs-Rails: Right;Left Home Layout: One level        Prior Function Level of Independence: Independent      Comments: reports has been driving with cataracts. Pt awaiting license renewal in August and Rt eye cataract surgery   PT Goals (current goals can now  be found in the care plan section) Acute Rehab PT Goals Patient Stated Goal: to get my drivers license PT Goal Formulation: With patient Time For Goal Achievement:  09/05/13 Potential to Achieve Goals: Good Progress towards PT goals: Progressing toward goals    Frequency  Min 4X/week    PT Plan Current plan remains appropriate    Co-evaluation             End of Session Equipment Utilized During Treatment: Gait belt Activity Tolerance: Patient tolerated treatment well Patient left: in chair;with call bell/phone within reach;with family/visitor present;with chair alarm set     Time: 1043-1106 PT Time Calculation (min): 23 min  Charges:  $Gait Training: 8-22 mins $Therapeutic Exercise: 8-22 mins                    G CodesDonell Sievert , [place]  [phone]  08/23/2013, 12:46 PM

## 2013-08-23 NOTE — Progress Notes (Signed)
ANTICOAGULATION CONSULT NOTE   Pharmacy Consult for coumadin Indication: atrial fibrillation and CVA  Allergies  Allergen Reactions  . Morphine And Related Other (See Comments)    hallucinations   Labs:  Recent Labs  08/21/13 1320 08/21/13 1342 08/21/13 1637 08/22/13 0950 08/22/13 1450 08/22/13 1920 08/23/13 0730  HGB 14.3 15.6*  --   --   --   --  14.3  HCT 43.2 46.0  --   --   --   --  43.0  PLT 335  --   --   --   --   --  341  APTT 37  --   --   --   --   --   --   LABPROT 21.1*  --  21.9*  --   --   --  23.2*  INR 1.82*  --  1.91*  --   --   --  2.06*  CREATININE 0.87 0.90  --   --   --   --  0.94  TROPONINI  --   --   --  <0.30 <0.30 <0.30  --     Estimated Creatinine Clearance: 42.9 ml/min (by C-G formula based on Cr of 0.94).   Assessment: 7 female with afib and CVA will be continued on coumadin.   INR 2.06 (trending up) Patient was on coumadin 3mg  po daily prior to admission.  Goal of Therapy:  INR 2-3 Monitor platelets by anticoagulation protocol: Yes   Plan:  1) Repeat Coumadin 4mg  po x1 2) INR in am  Thank you. , PharmD [phone]  08/23/2013,11:31 AM

## 2013-08-23 NOTE — Progress Notes (Signed)
BP re-checked.

## 2013-08-23 NOTE — Clinical Social Work Placement (Addendum)
Clinical Social Work Department CLINICAL SOCIAL WORK PLACEMENT NOTE 08/23/2013  Patient:  [name], [name]  Account Number:  [id number] Admit date:  08/21/2013  Clinical Social Worker:  Mosie Epstein  Date/time:  08/23/2013 02:23 PM  Clinical Social Work is seeking post-discharge placement for this patient at the following level of care:   SKILLED NURSING   (*CSW will update this form in Epic as items are completed)   08/23/2013  Patient/family provided with [facility] Department of Clinical Social Work's list of facilities offering this level of care within the geographic area requested by the patient (or if unable, by the patient's family).  08/23/2013  Patient/family informed of their freedom to choose among providers that offer the needed level of care, that participate in Medicare, Medicaid or managed care program needed by the patient, have an available bed and are willing to accept the patient.  08/23/2013  Patient/family informed of [facility]' ownership interest in [facility], as well as of the fact that they are under no obligation to receive care at this facility.  PASARR submitted to EDS on 08/23/2013 PASARR number received on 08/23/2013  FL2 transmitted to all facilities in geographic area requested by pt/family on  08/23/2013 FL2 transmitted to all facilities within larger geographic area on   Patient informed that his/her managed care company has contracts with or will negotiate with  certain facilities, including the following:     Patient/family informed of bed offers received:   Patient chooses bed at [facility] Physician recommends and patient chooses bed at    Patient to be transferred to [facility] on 08/25/13   Patient to be transferred to facility by [facility]  Patient and family notified of transfer on 08/25/13  Name of family member notified: Roselee Nova daughter    The following physician request were entered in  Epic:   Additional Comments:  Marcelline Deist, MSW, [facility] Licensed Clinical Social Worker [phone] and [phone]

## 2013-08-23 NOTE — Progress Notes (Signed)
IM (Important Message ) from Medicare given to patient about his rights; B Izak Anding RN,BSN,MHA [phone] 

## 2013-08-24 LAB — PROTIME-INR
INR: 2.38 — ABNORMAL HIGH (ref 0.00–1.49)
Prothrombin Time: 26 seconds — ABNORMAL HIGH (ref 11.6–15.2)

## 2013-08-24 MED ORDER — WARFARIN SODIUM 4 MG PO TABS: 4.0000 mg | ORAL_TABLET | ORAL | Status: DC

## 2013-08-24 MED ORDER — WARFARIN SODIUM 3 MG PO TABS
3.0000 mg | ORAL_TABLET | ORAL | Status: DC
  Administered 2013-08-24: 3 mg via ORAL
  Filled 2013-08-24 (×2): qty 1

## 2013-08-24 NOTE — Progress Notes (Signed)
ANTICOAGULATION CONSULT NOTE   Pharmacy Consult for coumadin Indication: atrial fibrillation and CVA  Allergies  Allergen Reactions  . Morphine And Related Other (See Comments)    hallucinations   Labs:  Recent Labs  08/21/13 1320 08/21/13 1342 08/21/13 1637 08/22/13 0950 08/22/13 1450 08/22/13 1920 08/23/13 0730 08/24/13 0830  HGB 14.3 15.6*  --   --   --   --  14.3  --   HCT 43.2 46.0  --   --   --   --  43.0  --   PLT 335  --   --   --   --   --  341  --   APTT 37  --   --   --   --   --   --   --   LABPROT 21.1*  --  21.9*  --   --   --  23.2* 26.0*  INR 1.82*  --  1.91*  --   --   --  2.06* 2.38*  CREATININE 0.87 0.90  --   --   --   --  0.94  --   TROPONINI  --   --   --  <0.30 <0.30 <0.30  --   --     Estimated Creatinine Clearance: 42.9 ml/min (by C-G formula based on Cr of 0.94).   Assessment: 40 female with afib and CVA will be continued on coumadin.   INR 2.38 (trending up) Patient was on coumadin 3mg  po daily prior to admission with admission INR 1.82  Goal of Therapy:  INR 2-3 Monitor platelets by anticoagulation protocol: Yes   Plan:  1) Coumadin 4.5 mg MWF, 3 mg TTSS 2) INR in am  Pravachol not yet ordered if still planned   Thank you. , PharmD [phone]  08/24/2013,11:21 AM

## 2013-08-24 NOTE — Progress Notes (Addendum)
Patient will D/C to [facility] tomorrow morning. Per [facility] admissions coordinator at [place] patient can come with an LOG pending [employer] auth. Mal Amabile reported that he started [facility] on Friday 08/23/13. Patient accepted bed offer. CSW faxed D/C summary to [facility] and sent Mal Amabile D/C summary via carefinder.    Jetta Lout, LCSWA Weekend CSW [phone]

## 2013-08-24 NOTE — Progress Notes (Signed)
Patient has no bed offers at this point for SNF. CSW will continue to work on SNF placement.   Jetta Lout, LCSWA Weekend CSW [phone]

## 2013-08-24 NOTE — Discharge Summary (Addendum)
Physician Discharge Summary  [name]  MRN: [id number]  DOB/AGE: Feb 23, 1923 80 y.o.  PCP: Maryella Shivers, MD  Admit date: 08/21/2013  Discharge date: 08/24/2013    Discharge Diagnoses:  CVA (cerebral infarction)  TIA (transient ischemic attack)  Atrial fibrillation-subtherapeutic INR   Followup recommendations  Continue to follow INR daily on inpatient rehabilitation-pharmacy to dose    Medication List         bisoprolol 5 MG tablet    Commonly known as: ZEBETA    Take 2 tablets (10 mg total) by mouth 2 (two) times daily.    diltiazem 120 MG 24 hr capsule    Commonly known as: CARDIZEM CD    Take 1 capsule (120 mg total) by mouth daily.    furosemide 20 MG tablet    Commonly known as: LASIX    Take 40 mg by mouth daily.    isosorbide mononitrate 30 MG 24 hr tablet    Commonly known as: IMDUR    Take 30 mg by mouth daily.    omeprazole 20 MG capsule    Commonly known as: PRILOSEC    Take 20 mg by mouth daily.    potassium chloride SA 20 MEQ tablet    Commonly known as: K-DUR,KLOR-CON    Take 1 tablet (20 mEq total) by mouth daily.    rOPINIRole 0.5 MG tablet    Commonly known as: REQUIP    Take 0.5 mg by mouth at bedtime.    TUMS 500 MG chewable tablet    Generic drug: calcium carbonate    Chew 1 tablet by mouth daily as needed for indigestion or heartburn.     Coumadin regimen to be $Rem'4mg'VGnR$  MWF and $Remov'3mg'WxeEBD$  TTSS         Pravastatin 40 mg a day   Discharge Condition:  Disposition: 01-Home or Self Care    Consults:  Neurology  Electrophysiology    Significant Diagnostic Studies:  Ct Angio Head W/cm &/or Wo Cm  08/21/2013 CLINICAL DATA: Weakness of the left side affecting the arm and leg EXAM: CT ANGIOGRAPHY HEAD AND NECK TECHNIQUE: Multidetector CT imaging of the head and neck was performed using the standard protocol during bolus administration of intravenous contrast. Multiplanar CT image reconstructions and MIPs were obtained to evaluate the vascular anatomy.  Carotid stenosis measurements (when applicable) are obtained utilizing NASCET criteria, using the distal internal carotid diameter as the denominator. CONTRAST: 50mL OMNIPAQUE IOHEXOL 350 MG/ML SOLN COMPARISON: Head CT same day FINDINGS: CTA HEAD FINDINGS Both internal carotid arteries show calcification in the siphon regions but there is no evidence of flow-limiting stenosis. The supra clinoid internal carotid arteries are widely patent. The anterior and middle cerebral arteries are patent without proximal stenosis come aneurysm or vascular malformation. No missing branches are demonstrated. The Both vertebral arteries are patent through the foramen magnum to the basilar. No basilar stenosis. Posterior circulation branch vessels appear normal. No intracranial venous pathology. No intracranial hemorrhage or evidence of acute infarction. There is chronic calcification in the left cerebellum which appears to be associated with some abnormal vessels. This probably represents a developmental venous anomaly. Review of the MIP images confirms the above findings. CTA NECK FINDINGS Lung apices are clear. No superior mediastinal lesion. The aortic arch shows atherosclerotic calcification but no aneurysm or dissection. The branching pattern of the brachiocephalic vessels from the arch is normal. No origin stenoses. The right common carotid artery is extremely tortuous but is widely patent to the bifurcation. The bifurcation is  widely patent without stenosis or irregularity. The right internal carotid artery is smooth and of normal caliber. The left common carotid artery is markedly tortuous but widely patent to the bifurcation. The carotid bifurcation does not show any stenosis or irregularity. The cervical internal carotid artery is tortuous but widely patent. The right vertebral artery origin is widely patent. The left vertebral artery origin is probably widely patent, but not optimally seen because of venous reflux. Right  vertebral artery is tortuous but widely patent beyond that. The left vertebral artery is tortuous. At the C2 and C3 level, there is either a fenestration or chronic dissection at the vertebral artery. There is no stenosis. The vessel appears widely patent beyond that. Review of the MIP images confirms the above findings. IMPRESSION: No intracranial occlusion or stenosis. Ordinary atherosclerotic calcification in the carotid siphon regions. 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 Tortuous vessels in the neck suggesting a history of chronic hypertension. No carotid bifurcation stenosis or irregularity. Linear defect in the left vertebral artery at the C2 and C3 level. This could represent a chronic fenestration or a chronic dissection. I suspected is probably incidental. Electronically Signed By: Nelson Chimes M.D. On: 08/21/2013 15:01  Dg Chest 2 View  08/21/2013 CLINICAL DATA: Short of breath. EXAM: CHEST 2 VIEW COMPARISON: 04/22/2013. FINDINGS: Apical lordotic projection. Large hiatal hernia. Cardiopericardial silhouette appears similar to the prior exam allowing for differences in projection. There is no airspace disease. No pleural effusion is identified. Thoracic kyphosis is present. Two lead LEFT subclavian cardiac pacemaker. Monitoring leads project over the chest. IMPRESSION: No active cardiopulmonary disease. Large hiatal hernia. No interval change. Electronically Signed By: Dereck Ligas M.D. On: 08/21/2013 20:39  Ct Head Wo Contrast  08/23/2013 CLINICAL DATA: Follow-up of stroke. EXAM: CT HEAD WITHOUT CONTRAST TECHNIQUE: Contiguous axial images were obtained from the base of the skull through the vertex without intravenous contrast. COMPARISON: CT of the head August 21, 2013 FINDINGS: The ventricles and sulci are normal for age. No intraparenchymal hemorrhage, mass effect nor midline shift. Patchy supratentorial white matter hypodensities are within normal range for patient's  age and though non-specific suggest sequelae of chronic small vessel ischemic disease. No acute large vascular territory infarcts. No abnormal extra-axial fluid collections. Dystrophic calcification left cerebellum likely reflects cavernoma. Basal cisterns are patent. Moderate calcific atherosclerosis of the carotid siphons. Slightly dense appearance of the basilar tip likely reflecting atherosclerosis. No skull fracture. The included ocular globes and orbital contents are non-suspicious. The mastoid aircells and included paranasal sinuses are well-aerated. IMPRESSION: No acute intracranial process. Stable appearance of the head: Involutional changes, mild white matter changes suggest chronic small vessel ischemic disease. 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 Electronically Signed By: Elon Alas On: 08/23/2013 02:01  Ct Head Wo Contrast  08/21/2013 CLINICAL DATA: Abnormal sensation and weakness on left side involving the arm and leg. EXAM: CT HEAD WITHOUT CONTRAST TECHNIQUE: Contiguous axial images were obtained from the base of the skull through the vertex without intravenous contrast. COMPARISON: 04/22/2013 FINDINGS: Ventricles are normal in configuration. There is ventricular enlargement, greater than sulcal enlargement, consistent with predominantly central atrophy. This is stable. There is no hydrocephalus. No parenchymal masses or mass effect. There is no evidence of a recent cortical infarct. Mild white matter hypoattenuation is noted most consistent with chronic microvascular ischemic change. Stable calcification in the left cerebellum. No extra-axial masses or abnormal fluid collections. There is no intracranial hemorrhage. Visualized sinuses and mastoid air cells are clear. IMPRESSION: 1. No acute  intracranial abnormalities. No intracranial hemorrhage. 2. Stable atrophy and mild chronic microvascular ischemic change. These results were called by telephone at the time of interpretation on 08/21/2013 at  1:46 pm to Dr. Nicole Kindred , who verbally acknowledged these results. Electronically Signed By: Lajean Manes M.D. On: 08/21/2013 13:47  Ct Angio Neck W/cm &/or Wo/cm  08/21/2013 CLINICAL DATA: Weakness of the left side affecting the arm and leg EXAM: CT ANGIOGRAPHY HEAD AND NECK TECHNIQUE: Multidetector CT imaging of the head and neck was performed using the standard protocol during bolus administration of intravenous contrast. Multiplanar CT image reconstructions and MIPs were obtained to evaluate the vascular anatomy. Carotid stenosis measurements (when applicable) are obtained utilizing NASCET criteria, using the distal internal carotid diameter as the denominator. CONTRAST: 62mL OMNIPAQUE IOHEXOL 350 MG/ML SOLN COMPARISON: Head CT same day FINDINGS: CTA HEAD FINDINGS Both internal carotid arteries show calcification in the siphon regions but there is no evidence of flow-limiting stenosis. The supra clinoid internal carotid arteries are widely patent. The anterior and middle cerebral arteries are patent without proximal stenosis come aneurysm or vascular malformation. No missing branches are demonstrated. The Both vertebral arteries are patent through the foramen magnum to the basilar. No basilar stenosis. Posterior circulation branch vessels appear normal. No intracranial venous pathology. No intracranial hemorrhage or evidence of acute infarction. There is chronic calcification in the left cerebellum which appears to be associated with some abnormal vessels. This probably represents a developmental venous anomaly. Review of the MIP images confirms the above findings. CTA NECK FINDINGS Lung apices are clear. No superior mediastinal lesion. The aortic arch shows atherosclerotic calcification but no aneurysm or dissection. The branching pattern of the brachiocephalic vessels from the arch is normal. No origin stenoses. The right common carotid artery is extremely tortuous but is widely patent to the bifurcation. The  bifurcation is widely patent without stenosis or irregularity. The right internal carotid artery is smooth and of normal caliber. The left common carotid artery is markedly tortuous but widely patent to the bifurcation. The carotid bifurcation does not show any stenosis or irregularity. The cervical internal carotid artery is tortuous but widely patent. The right vertebral artery origin is widely patent. The left vertebral artery origin is probably widely patent, but not optimally seen because of venous reflux. Right vertebral artery is tortuous but widely patent beyond that. The left vertebral artery is tortuous. At the C2 and C3 level, there is either a fenestration or chronic dissection at the vertebral artery. There is no stenosis. The vessel appears widely patent beyond that. Review of the MIP images confirms the above findings. IMPRESSION: No intracranial occlusion or stenosis. Ordinary atherosclerotic calcification in the carotid siphon regions. 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 Tortuous vessels in the neck suggesting a history of chronic hypertension. No carotid bifurcation stenosis or irregularity. Linear defect in the left vertebral artery at the C2 and C3 level. This could represent a chronic fenestration or a chronic dissection. I suspected is probably incidental. Electronically Signed By: Nelson Chimes M.D. On: 08/21/2013 15:01  Microbiology:  No results found for this or any previous visit (from the past 240 hour(s)).  Labs:  Results for orders placed during the hospital encounter of 08/21/13 (from the past 48 hour(s))   ETHANOL Status: None    Collection Time    08/21/13 1:20 PM   Result  Value  Ref Range    Alcohol, Ethyl (B)  <11  0 - 11 mg/dL    Comment:  LOWEST DETECTABLE LIMIT FOR     SERUM ALCOHOL IS 11 mg/dL     FOR MEDICAL PURPOSES ONLY   PROTIME-INR Status: Abnormal    Collection Time    08/21/13 1:20 PM   Result  Value  Ref Range     Prothrombin Time  21.1 (*)  11.6 - 15.2 seconds    INR  1.82 (*)  0.00 - 1.49   APTT Status: None    Collection Time    08/21/13 1:20 PM   Result  Value  Ref Range    aPTT  37  24 - 37 seconds    Comment:      IF BASELINE aPTT IS ELEVATED,     SUGGEST PATIENT RISK ASSESSMENT     BE USED TO DETERMINE APPROPRIATE     ANTICOAGULANT THERAPY.   CBC Status: None    Collection Time    08/21/13 1:20 PM   Result  Value  Ref Range    WBC  8.7  4.0 - 10.5 K/uL    RBC  4.57  3.87 - 5.11 MIL/uL    Hemoglobin  14.3  12.0 - 15.0 g/dL    HCT  43.2  36.0 - 46.0 %    MCV  94.5  78.0 - 100.0 fL    MCH  31.3  26.0 - 34.0 pg    MCHC  33.1  30.0 - 36.0 g/dL    RDW  14.2  11.5 - 15.5 %    Platelets  335  150 - 400 K/uL   DIFFERENTIAL Status: None    Collection Time    08/21/13 1:20 PM   Result  Value  Ref Range    Neutrophils Relative %  43  43 - 77 %    Neutro Abs  3.8  1.7 - 7.7 K/uL    Lymphocytes Relative  44  12 - 46 %    Lymphs Abs  3.8  0.7 - 4.0 K/uL    Monocytes Relative  11  3 - 12 %    Monocytes Absolute  0.9  0.1 - 1.0 K/uL    Eosinophils Relative  1  0 - 5 %    Eosinophils Absolute  0.1  0.0 - 0.7 K/uL    Basophils Relative  1  0 - 1 %    Basophils Absolute  0.1  0.0 - 0.1 K/uL   COMPREHENSIVE METABOLIC PANEL Status: Abnormal    Collection Time    08/21/13 1:20 PM   Result  Value  Ref Range    Sodium  140  137 - 147 mEq/L    Potassium  4.2  3.7 - 5.3 mEq/L    Chloride  103  96 - 112 mEq/L    CO2  22  19 - 32 mEq/L    Glucose, Bld  100 (*)  70 - 99 mg/dL    BUN  13  6 - 23 mg/dL    Creatinine, Ser  0.87  0.50 - 1.10 mg/dL    Calcium  9.1  8.4 - 10.5 mg/dL    Total Protein  7.3  6.0 - 8.3 g/dL    Albumin  3.4 (*)  3.5 - 5.2 g/dL    AST  21  0 - 37 U/L    ALT  11  0 - 35 U/L    Alkaline Phosphatase  130 (*)  39 - 117 U/L    Total Bilirubin  0.4  0.3 - 1.2 mg/dL  GFR calc non Af Amer  58 (*)  >90 mL/min    GFR calc Af Amer  67 (*)  >90 mL/min    Comment:  (NOTE)      The eGFR has been calculated using the CKD EPI equation.     This calculation has not been validated in all clinical situations.     eGFR's persistently <90 mL/min signify possible Chronic Kidney     Disease.    Anion gap  15  5 - 15   I-STAT TROPOININ, ED Status: None    Collection Time    08/21/13 1:40 PM   Result  Value  Ref Range    Troponin i, poc  0.00  0.00 - 0.08 ng/mL    Comment 3      Comment:  Due to the release kinetics of cTnI,     a negative result within the first hours     of the onset of symptoms does not rule out     myocardial infarction with certainty.     If myocardial infarction is still suspected,     repeat the test at appropriate intervals.   I-STAT CHEM 8, ED Status: Abnormal    Collection Time    08/21/13 1:42 PM   Result  Value  Ref Range    Sodium  141  137 - 147 mEq/L    Potassium  4.0  3.7 - 5.3 mEq/L    Chloride  105  96 - 112 mEq/L    BUN  13  6 - 23 mg/dL    Creatinine, Ser  0.90  0.50 - 1.10 mg/dL    Glucose, Bld  103 (*)  70 - 99 mg/dL    Calcium, Ion  1.13  1.13 - 1.30 mmol/L    TCO2  24  0 - 100 mmol/L    Hemoglobin  15.6 (*)  12.0 - 15.0 g/dL    HCT  46.0  36.0 - 46.0 %   CBG MONITORING, ED Status: None    Collection Time    08/21/13 1:49 PM   Result  Value  Ref Range    Glucose-Capillary  93  70 - 99 mg/dL   PROTIME-INR Status: Abnormal    Collection Time    08/21/13 4:37 PM   Result  Value  Ref Range    Prothrombin Time  21.9 (*)  11.6 - 15.2 seconds    INR  1.91 (*)  0.00 - 1.49   URINE RAPID DRUG SCREEN (HOSP PERFORMED) Status: None    Collection Time    08/21/13 6:51 PM   Result  Value  Ref Range    Opiates  NONE DETECTED  NONE DETECTED    Cocaine  NONE DETECTED  NONE DETECTED    Benzodiazepines  NONE DETECTED  NONE DETECTED    Amphetamines  NONE DETECTED  NONE DETECTED    Tetrahydrocannabinol  NONE DETECTED  NONE DETECTED    Barbiturates  NONE DETECTED  NONE DETECTED    Comment:      DRUG SCREEN FOR MEDICAL PURPOSES      ONLY. IF CONFIRMATION IS NEEDED     FOR ANY PURPOSE, NOTIFY LAB     WITHIN 5 DAYS.         LOWEST DETECTABLE LIMITS     FOR URINE DRUG SCREEN     Drug Class Cutoff (ng/mL)     Amphetamine 1000     Barbiturate 200     Benzodiazepine 200  Tricyclics 119     Opiates 300     Cocaine 300     THC 50   URINALYSIS, ROUTINE W REFLEX MICROSCOPIC Status: Abnormal    Collection Time    08/21/13 6:51 PM   Result  Value  Ref Range    Color, Urine  YELLOW  YELLOW    APPearance  CLEAR  CLEAR    Specific Gravity, Urine  1.042 (*)  1.005 - 1.030    pH  7.0  5.0 - 8.0    Glucose, UA  NEGATIVE  NEGATIVE mg/dL    Hgb urine dipstick  MODERATE (*)  NEGATIVE    Bilirubin Urine  NEGATIVE  NEGATIVE    Ketones, ur  NEGATIVE  NEGATIVE mg/dL    Protein, ur  NEGATIVE  NEGATIVE mg/dL    Urobilinogen, UA  0.2  0.0 - 1.0 mg/dL    Nitrite  NEGATIVE  NEGATIVE    Leukocytes, UA  NEGATIVE  NEGATIVE   URINE MICROSCOPIC-ADD ON Status: Abnormal    Collection Time    08/21/13 6:51 PM   Result  Value  Ref Range    Squamous Epithelial / LPF  FEW (*)  RARE    WBC, UA  0-2  <3 WBC/hpf    RBC / HPF  7-10  <3 RBC/hpf    Bacteria, UA  RARE  RARE   HEMOGLOBIN A1C Status: Abnormal    Collection Time    08/22/13 5:44 AM   Result  Value  Ref Range    Hemoglobin A1C  6.2 (*)  <5.7 %    Comment:  (NOTE)         According to the ADA Clinical Practice Recommendations for 2011, when     HbA1c is used as a screening test:     >=6.5% Diagnostic of Diabetes Mellitus     (if abnormal result is confirmed)     5.7-6.4% Increased risk of developing Diabetes Mellitus     References:Diagnosis and Classification of Diabetes Mellitus,Diabetes     JYNW,2956,21(HYQMV 1):S62-S69 and Standards of Medical Care in     Diabetes - 2011,Diabetes Care,2011,34 (Suppl 1):S11-S61.    Mean Plasma Glucose  131 (*)  <117 mg/dL    Comment:  Performed at [facility] Status: Abnormal    Collection Time    08/22/13 5:44  AM   Result  Value  Ref Range    Cholesterol  207 (*)  0 - 200 mg/dL    Triglycerides  199 (*)  <150 mg/dL    HDL  43  >39 mg/dL    Total CHOL/HDL Ratio  4.8     VLDL  40  0 - 40 mg/dL    LDL Cholesterol  124 (*)  0 - 99 mg/dL    Comment:      Total Cholesterol/HDL:CHD Risk     Coronary Heart Disease Risk Table     Men Women     1/2 Average Risk 3.4 3.3     Average Risk 5.0 4.4     2 X Average Risk 9.6 7.1     3 X Average Risk 23.4 11.0         Use the calculated Patient Ratio     above and the CHD Risk Table     to determine the patient's CHD Risk.         ATP III CLASSIFICATION (LDL):     <100 mg/dL Optimal     100-129  mg/dL Near or Above     Optimal     130-159 mg/dL Borderline     160-189 mg/dL High     >190 mg/dL Very High   TROPONIN I Status: None    Collection Time    08/22/13 9:50 AM   Result  Value  Ref Range    Troponin I  <0.30  <0.30 ng/mL    Comment:      Due to the release kinetics of cTnI,     a negative result within the first hours     of the onset of symptoms does not rule out     myocardial infarction with certainty.     If myocardial infarction is still suspected,     repeat the test at appropriate intervals.   TROPONIN I Status: None    Collection Time    08/22/13 2:50 PM   Result  Value  Ref Range    Troponin I  <0.30  <0.30 ng/mL    Comment:      Due to the release kinetics of cTnI,     a negative result within the first hours     of the onset of symptoms does not rule out     myocardial infarction with certainty.     If myocardial infarction is still suspected,     repeat the test at appropriate intervals.   TROPONIN I Status: None    Collection Time    08/22/13 7:20 PM   Result  Value  Ref Range    Troponin I  <0.30  <0.30 ng/mL    Comment:      Due to the release kinetics of cTnI,     a negative result within the first hours     of the onset of symptoms does not rule out     myocardial infarction with certainty.     If myocardial  infarction is still suspected,     repeat the test at appropriate intervals.   BASIC METABOLIC PANEL Status: Abnormal    Collection Time    08/23/13 7:30 AM   Result  Value  Ref Range    Sodium  143  137 - 147 mEq/L    Potassium  4.4  3.7 - 5.3 mEq/L    Chloride  102  96 - 112 mEq/L    CO2  25  19 - 32 mEq/L    Glucose, Bld  100 (*)  70 - 99 mg/dL    BUN  11  6 - 23 mg/dL    Creatinine, Ser  0.94  0.50 - 1.10 mg/dL    Calcium  9.2  8.4 - 10.5 mg/dL    GFR calc non Af Amer  53 (*)  >90 mL/min    GFR calc Af Amer  61 (*)  >90 mL/min    Comment:  (NOTE)     The eGFR has been calculated using the CKD EPI equation.     This calculation has not been validated in all clinical situations.     eGFR's persistently <90 mL/min signify possible Chronic Kidney     Disease.    Anion gap  16 (*)  5 - 15   CBC Status: None    Collection Time    08/23/13 7:30 AM   Result  Value  Ref Range    WBC  8.8  4.0 - 10.5 K/uL    RBC  4.54  3.87 - 5.11 MIL/uL    Hemoglobin  14.3  12.0 - 15.0 g/dL    HCT  43.0  36.0 - 46.0 %    MCV  94.7  78.0 - 100.0 fL    MCH  31.5  26.0 - 34.0 pg    MCHC  33.3  30.0 - 36.0 g/dL    RDW  14.4  11.5 - 15.5 %    Platelets  341  150 - 400 K/uL   PROTIME-INR Status: Abnormal    Collection Time    08/23/13 7:30 AM   Result  Value  Ref Range    Prothrombin Time  23.2 (*)  11.6 - 15.2 seconds    INR  2.06 (*)  0.00 - 1.49   TSH Status: None    Collection Time    08/23/13 9:39 AM   Result  Value  Ref Range    TSH  4.040  0.350 - 4.500 uIU/mL    HPI :*  80 y.o. female history of atrial fibrillation on Coumadin, hypertension and hyperlipidemia who was brought to the emergency room following acute onset of left-sided weakness and numbness which started about 11:30 AM today. She has no previous history of stroke nor TIA. She had no changes in speech and no noticeable changes in mental status. CT scan of her head showed no acute intracranial abnormality. Subsequent CTA showed  no intracranial large vessel occlusion. INR was 1.82. NIH stroke score was 7.   HOSPITAL COURSE:  Possible CVA  presenting with left-sided weakness and numbness. Initial imaging does not confirm new stroke, however, she cannot have MRI due to pacer. She still has left arm symptoms. Suspect right brain embolic infarct secondary to known atrial fibrillation with subtherapeutic INR. However, there are functional component present, likely due to recent stress. On warfarin prior to admission; INR 1.82 on arrival, pt states she has been compliant. Now on warfarin for secondary stroke prevention given no sign of large ischemic stroke. Cannot have an MRI because of pacemaker .CT angiogram of the head and neck is negative  Hypertension. BP 122-182/53-137  Paroxysmal atrial fibrillation. EP recommends change to eliquis. Discussed with pt/family. On home bisoprolol and diltiazem.  Hyperlipidemia, LDL 124, on no statin because of weakness related to Lipitor, will place the patient on simvastatin, goal LDL < 100 (< 70 for diabetics)  A1C 6.2, within the goal  Morbid Obesity, Body mass index is 40.04 kg/(m^2).  Stress related to recent death of her husband 2 weeks ago, may be exacerbating her stroke symptoms  PT recommended CIR, rolling walker  Speech therapy did not identify any current needs  However, pt reported that her INR was stable at therapeutic level at home, did not feel difficulty to control INR, get used to the life style while using coumadin and given her age, continue coumadin is also a good option. Had long discussion with pt and family regarding options on anticoagulation, they are leaning towards coumadin. If change to NOAC, recommend eliquis $RemoveBeforeDEI'5mg'[id number]$  bid  Neurology recommended repeating the CAT scan on 7/24,  CT shows Stable appearance of the head: Involutional changes, mild white  matter changes suggest chronic small vessel ischemic disease.  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  INR therapeutic at  2.38 Will need dosing per pharmacy protocol  Will need to hold coumadin ,and bridge with heparin gtt prior to cataract surgery    Atrial fibrillation  INR subtherapeutic upon admission  Seen by Dr. Rayann Heman, pacemaker interrogated, pacemaker functioning normally,  Given labile INR leading to stroke, cardiology would strongly advise consideration  of a NOAC.  recommend eliquis $RemoveBeforeDEI'5mg'[id number]$  bid  Patient prefers to stay on Coumadin for now  Continue home bisoprolol and diltiazem dosing. Can titrate diltiazem if needed per cardiology. Goal heart rates should be mostly < 100 bpm  Decreased diltiazem to 120 mg a day    hypertension , controlled currently  Uncontrolled upon admission, on bisoprolol, diltiazem, Imdur  Decreased diltiazem to 120 mg a day    Discharge Exam: *  Blood pressure 134/72, pulse 69, temperature 97.8 F (36.6 C), temperature source Oral, resp. rate 18, height 5' (1.524 m), weight 93 kg (205 lb 0.4 oz), SpO2 95.00%.  CARD: RRR; S1 and S2 appreciated; no murmurs, no clicks, no rubs, no gallops  RESP: Normal chest excursion without splinting or tachypnea; breath sounds clear and equal bilaterally; no wheezes, no rhonchi, no rales,  ABD/GI: Normal bowel sounds; non-distended; soft, non-tender, no rebound, no guarding  BACK: The back appears normal and is non-tender to palpation, there is no CVA tenderness  EXT: Normal ROM in all joints; non-tender to palpation; no edema; normal capillary refill; no cyanosis  SKIN: Normal color for age and race; warm  NEURO: Patient has left-sided upper and lower extremity weakness and pronator drift, almost no grip strength the left upper extremity, sensation to light touch intact diffusely, strength 5/5 in her right upper and lower extremity, cranial nerves II through XII intact, no slurred speech, NIH stroke scale 7      Discharge Instructions    Diet - low sodium heart healthy  Complete by: As directed       Increase activity slowly  Complete  by: As directed            Follow-up Information    Schedule an appointment as soon as possible for a visit to follow up.       Follow up with HODGES,FRANCISCO, MD. Schedule an appointment as soon as possible for a visit in 1 week.    Specialty: Family Medicine       Follow up with Thompson Grayer, MD. Schedule an appointment as soon as possible for a visit in 2 weeks.    Specialty: Cardiology    Contact information:    [address]  [address]  [place] [address]  [phone]

## 2013-08-25 LAB — PROTIME-INR
INR: 2.46 — ABNORMAL HIGH (ref 0.00–1.49)
PROTHROMBIN TIME: 26.7 s — AB (ref 11.6–15.2)

## 2013-08-25 MED ORDER — WARFARIN SODIUM 2 MG PO TABS: ORAL_TABLET | ORAL | Status: DC

## 2013-08-25 NOTE — Progress Notes (Signed)
ANTICOAGULATION CONSULT NOTE   Pharmacy Consult for coumadin Indication: atrial fibrillation and CVA  Allergies  Allergen Reactions  . Morphine And Related Other (See Comments)    hallucinations   Labs:  Recent Labs  08/22/13 1450 08/22/13 1920 08/23/13 0730 08/24/13 0830 08/25/13 0402  HGB  --   --  14.3  --   --   HCT  --   --  43.0  --   --   PLT  --   --  341  --   --   LABPROT  --   --  23.2* 26.0* 26.7*  INR  --   --  2.06* 2.38* 2.46*  CREATININE  --   --  0.94  --   --   TROPONINI <0.30 <0.30  --   --   --     Estimated Creatinine Clearance: 42.9 ml/min (by C-G formula based on Cr of 0.94).   Assessment: 89yof continues on coumadin for afib/CVA. INR is therapeutic. Was on coumadin 3mg  daily pta but admitted with subtherapeutic INR and required several 4mg  doses to reach goal.  Goal of Therapy:  INR 2-3 Monitor platelets by anticoagulation protocol: Yes   Plan:  1) Recommend d/c regimen to be 4mg  MWF and 3mg  TTSS  , PharmD, BCPS 08/25/2013,9:55 AM

## 2013-08-25 NOTE — Discharge Summary (Signed)
Physician Discharge Summary  [name]  MRN: [id number]  DOB/AGE: 78-Jun-1925 78 y.o.  PCP: Maryella Shivers, MD  Admit date: 08/21/2013  Discharge date: 08/25/2013    Discharge Diagnoses:  CVA (cerebral infarction)  TIA (transient ischemic attack)  Atrial fibrillation-subtherapeutic INR    Followup recommendations  Continue to follow INR daily , therapeutic between 2-3      Medication List         bisoprolol 5 MG tablet    Commonly known as: ZEBETA    Take 2 tablets (10 mg total) by mouth 2 (two) times daily.    diltiazem 120 MG 24 hr capsule    Commonly known as: CARDIZEM CD    Take 1 capsule (120 mg total) by mouth daily.    furosemide 20 MG tablet    Commonly known as: LASIX    Take 40 mg by mouth daily.    isosorbide mononitrate 30 MG 24 hr tablet    Commonly known as: IMDUR    Take 30 mg by mouth daily.    omeprazole 20 MG capsule    Commonly known as: PRILOSEC    Take 20 mg by mouth daily.    potassium chloride SA 20 MEQ tablet    Commonly known as: K-DUR,KLOR-CON    Take 1 tablet (20 mEq total) by mouth daily.    rOPINIRole 0.5 MG tablet    Commonly known as: REQUIP    Take 0.5 mg by mouth at bedtime.    TUMS 500 MG chewable tablet    Generic drug: calcium carbonate    Chew 1 tablet by mouth daily as needed for indigestion or heartburn.    Coumadin regimen to be $Rem'4mg'BnAq$  MWF and $Remov'3mg'JrFjnm$  TTSS         Pravastatin 40 mg a day   Discharge Condition:  Disposition: 01-SNF   Consults:  Neurology  Electrophysiology  Significant Diagnostic Studies:  Ct Angio Head W/cm &/or Wo Cm  08/21/2013 CLINICAL DATA: Weakness of the left side affecting the arm and leg EXAM: CT ANGIOGRAPHY HEAD AND NECK TECHNIQUE: Multidetector CT imaging of the head and neck was performed using the standard protocol during bolus administration of intravenous contrast. Multiplanar CT image reconstructions and MIPs were obtained to evaluate the vascular anatomy. Carotid stenosis measurements (when  applicable) are obtained utilizing NASCET criteria, using the distal internal carotid diameter as the denominator. CONTRAST: 9mL OMNIPAQUE IOHEXOL 350 MG/ML SOLN COMPARISON: Head CT same day FINDINGS: CTA HEAD FINDINGS Both internal carotid arteries show calcification in the siphon regions but there is no evidence of flow-limiting stenosis. The supra clinoid internal carotid arteries are widely patent. The anterior and middle cerebral arteries are patent without proximal stenosis come aneurysm or vascular malformation. No missing branches are demonstrated. The Both vertebral arteries are patent through the foramen magnum to the basilar. No basilar stenosis. Posterior circulation branch vessels appear normal. No intracranial venous pathology. No intracranial hemorrhage or evidence of acute infarction. There is chronic calcification in the left cerebellum which appears to be associated with some abnormal vessels. This probably represents a developmental venous anomaly. Review of the MIP images confirms the above findings. CTA NECK FINDINGS Lung apices are clear. No superior mediastinal lesion. The aortic arch shows atherosclerotic calcification but no aneurysm or dissection. The branching pattern of the brachiocephalic vessels from the arch is normal. No origin stenoses. The right common carotid artery is extremely tortuous but is widely patent to the bifurcation. The bifurcation is widely patent without stenosis or  irregularity. The right internal carotid artery is smooth and of normal caliber. The left common carotid artery is markedly tortuous but widely patent to the bifurcation. The carotid bifurcation does not show any stenosis or irregularity. The cervical internal carotid artery is tortuous but widely patent. The right vertebral artery origin is widely patent. The left vertebral artery origin is probably widely patent, but not optimally seen because of venous reflux. Right vertebral artery is tortuous but  widely patent beyond that. The left vertebral artery is tortuous. At the C2 and C3 level, there is either a fenestration or chronic dissection at the vertebral artery. There is no stenosis. The vessel appears widely patent beyond that. Review of the MIP images confirms the above findings. IMPRESSION: No intracranial occlusion or stenosis. Ordinary atherosclerotic calcification in the carotid siphon regions. 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 Tortuous vessels in the neck suggesting a history of chronic hypertension. No carotid bifurcation stenosis or irregularity. Linear defect in the left vertebral artery at the C2 and C3 level. This could represent a chronic fenestration or a chronic dissection. I suspected is probably incidental. Electronically Signed By: Nelson Chimes M.D. On: 08/21/2013 15:01  Dg Chest 2 View  08/21/2013 CLINICAL DATA: Short of breath. EXAM: CHEST 2 VIEW COMPARISON: 04/22/2013. FINDINGS: Apical lordotic projection. Large hiatal hernia. Cardiopericardial silhouette appears similar to the prior exam allowing for differences in projection. There is no airspace disease. No pleural effusion is identified. Thoracic kyphosis is present. Two lead LEFT subclavian cardiac pacemaker. Monitoring leads project over the chest. IMPRESSION: No active cardiopulmonary disease. Large hiatal hernia. No interval change. Electronically Signed By: Dereck Ligas M.D. On: 08/21/2013 20:39  Ct Head Wo Contrast  08/23/2013 CLINICAL DATA: Follow-up of stroke. EXAM: CT HEAD WITHOUT CONTRAST TECHNIQUE: Contiguous axial images were obtained from the base of the skull through the vertex without intravenous contrast. COMPARISON: CT of the head August 21, 2013 FINDINGS: The ventricles and sulci are normal for age. No intraparenchymal hemorrhage, mass effect nor midline shift. Patchy supratentorial white matter hypodensities are within normal range for patient's age and though non-specific  suggest sequelae of chronic small vessel ischemic disease. No acute large vascular territory infarcts. No abnormal extra-axial fluid collections. Dystrophic calcification left cerebellum likely reflects cavernoma. Basal cisterns are patent. Moderate calcific atherosclerosis of the carotid siphons. Slightly dense appearance of the basilar tip likely reflecting atherosclerosis. No skull fracture. The included ocular globes and orbital contents are non-suspicious. The mastoid aircells and included paranasal sinuses are well-aerated. IMPRESSION: No acute intracranial process. Stable appearance of the head: Involutional changes, mild white matter changes suggest chronic small vessel ischemic disease. 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 Electronically Signed By: Elon Alas On: 08/23/2013 02:01  Ct Head Wo Contrast  08/21/2013 CLINICAL DATA: Abnormal sensation and weakness on left side involving the arm and leg. EXAM: CT HEAD WITHOUT CONTRAST TECHNIQUE: Contiguous axial images were obtained from the base of the skull through the vertex without intravenous contrast. COMPARISON: 04/22/2013 FINDINGS: Ventricles are normal in configuration. There is ventricular enlargement, greater than sulcal enlargement, consistent with predominantly central atrophy. This is stable. There is no hydrocephalus. No parenchymal masses or mass effect. There is no evidence of a recent cortical infarct. Mild white matter hypoattenuation is noted most consistent with chronic microvascular ischemic change. Stable calcification in the left cerebellum. No extra-axial masses or abnormal fluid collections. There is no intracranial hemorrhage. Visualized sinuses and mastoid air cells are clear. IMPRESSION: 1. No acute intracranial abnormalities. No intracranial hemorrhage.  2. Stable atrophy and mild chronic microvascular ischemic change. These results were called by telephone at the time of interpretation on 08/21/2013 at 1:46 pm to Dr. Roseanne Reno ,  who verbally acknowledged these results. Electronically Signed By: Amie Portland M.D. On: 08/21/2013 13:47  Ct Angio Neck W/cm &/or Wo/cm  08/21/2013 CLINICAL DATA: Weakness of the left side affecting the arm and leg EXAM: CT ANGIOGRAPHY HEAD AND NECK TECHNIQUE: Multidetector CT imaging of the head and neck was performed using the standard protocol during bolus administration of intravenous contrast. Multiplanar CT image reconstructions and MIPs were obtained to evaluate the vascular anatomy. Carotid stenosis measurements (when applicable) are obtained utilizing NASCET criteria, using the distal internal carotid diameter as the denominator. CONTRAST: 86mL OMNIPAQUE IOHEXOL 350 MG/ML SOLN COMPARISON: Head CT same day FINDINGS: CTA HEAD FINDINGS Both internal carotid arteries show calcification in the siphon regions but there is no evidence of flow-limiting stenosis. The supra clinoid internal carotid arteries are widely patent. The anterior and middle cerebral arteries are patent without proximal stenosis come aneurysm or vascular malformation. No missing branches are demonstrated. The Both vertebral arteries are patent through the foramen magnum to the basilar. No basilar stenosis. Posterior circulation branch vessels appear normal. No intracranial venous pathology. No intracranial hemorrhage or evidence of acute infarction. There is chronic calcification in the left cerebellum which appears to be associated with some abnormal vessels. This probably represents a developmental venous anomaly. Review of the MIP images confirms the above findings. CTA NECK FINDINGS Lung apices are clear. No superior mediastinal lesion. The aortic arch shows atherosclerotic calcification but no aneurysm or dissection. The branching pattern of the brachiocephalic vessels from the arch is normal. No origin stenoses. The right common carotid artery is extremely tortuous but is widely patent to the bifurcation. The bifurcation is widely  patent without stenosis or irregularity. The right internal carotid artery is smooth and of normal caliber. The left common carotid artery is markedly tortuous but widely patent to the bifurcation. The carotid bifurcation does not show any stenosis or irregularity. The cervical internal carotid artery is tortuous but widely patent. The right vertebral artery origin is widely patent. The left vertebral artery origin is probably widely patent, but not optimally seen because of venous reflux. Right vertebral artery is tortuous but widely patent beyond that. The left vertebral artery is tortuous. At the C2 and C3 level, there is either a fenestration or chronic dissection at the vertebral artery. There is no stenosis. The vessel appears widely patent beyond that. Review of the MIP images confirms the above findings. IMPRESSION: No intracranial occlusion or stenosis. Ordinary atherosclerotic calcification in the carotid siphon regions. 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 Tortuous vessels in the neck suggesting a history of chronic hypertension. No carotid bifurcation stenosis or irregularity. Linear defect in the left vertebral artery at the C2 and C3 level. This could represent a chronic fenestration or a chronic dissection. I suspected is probably incidental. Electronically Signed By: Paulina Fusi M.D. On: 08/21/2013 15:01  Microbiology:  No results found for this or any previous visit (from the past 240 hour(s)).  Labs:  Results for orders placed during the hospital encounter of 08/21/13 (from the past 48 hour(s))   ETHANOL Status: None    Collection Time    08/21/13 1:20 PM   Result  Value  Ref Range    Alcohol, Ethyl (B)  <11  0 - 11 mg/dL    Comment:      LOWEST  DETECTABLE LIMIT FOR     SERUM ALCOHOL IS 11 mg/dL     FOR MEDICAL PURPOSES ONLY   PROTIME-INR Status: Abnormal    Collection Time    08/21/13 1:20 PM   Result  Value  Ref Range    Prothrombin Time  21.1  (*)  11.6 - 15.2 seconds    INR  1.82 (*)  0.00 - 1.49   APTT Status: None    Collection Time    08/21/13 1:20 PM   Result  Value  Ref Range    aPTT  37  24 - 37 seconds    Comment:      IF BASELINE aPTT IS ELEVATED,     SUGGEST PATIENT RISK ASSESSMENT     BE USED TO DETERMINE APPROPRIATE     ANTICOAGULANT THERAPY.   CBC Status: None    Collection Time    08/21/13 1:20 PM   Result  Value  Ref Range    WBC  8.7  4.0 - 10.5 K/uL    RBC  4.57  3.87 - 5.11 MIL/uL    Hemoglobin  14.3  12.0 - 15.0 g/dL    HCT  43.2  36.0 - 46.0 %    MCV  94.5  78.0 - 100.0 fL    MCH  31.3  26.0 - 34.0 pg    MCHC  33.1  30.0 - 36.0 g/dL    RDW  14.2  11.5 - 15.5 %    Platelets  335  150 - 400 K/uL   DIFFERENTIAL Status: None    Collection Time    08/21/13 1:20 PM   Result  Value  Ref Range    Neutrophils Relative %  43  43 - 77 %    Neutro Abs  3.8  1.7 - 7.7 K/uL    Lymphocytes Relative  44  12 - 46 %    Lymphs Abs  3.8  0.7 - 4.0 K/uL    Monocytes Relative  11  3 - 12 %    Monocytes Absolute  0.9  0.1 - 1.0 K/uL    Eosinophils Relative  1  0 - 5 %    Eosinophils Absolute  0.1  0.0 - 0.7 K/uL    Basophils Relative  1  0 - 1 %    Basophils Absolute  0.1  0.0 - 0.1 K/uL   COMPREHENSIVE METABOLIC PANEL Status: Abnormal    Collection Time    08/21/13 1:20 PM   Result  Value  Ref Range    Sodium  140  137 - 147 mEq/L    Potassium  4.2  3.7 - 5.3 mEq/L    Chloride  103  96 - 112 mEq/L    CO2  22  19 - 32 mEq/L    Glucose, Bld  100 (*)  70 - 99 mg/dL    BUN  13  6 - 23 mg/dL    Creatinine, Ser  0.87  0.50 - 1.10 mg/dL    Calcium  9.1  8.4 - 10.5 mg/dL    Total Protein  7.3  6.0 - 8.3 g/dL    Albumin  3.4 (*)  3.5 - 5.2 g/dL    AST  21  0 - 37 U/L    ALT  11  0 - 35 U/L    Alkaline Phosphatase  130 (*)  39 - 117 U/L    Total Bilirubin  0.4  0.3 - 1.2 mg/dL  GFR calc non Af Amer  58 (*)  >90 mL/min    GFR calc Af Amer  67 (*)  >90 mL/min    Comment:  (NOTE)     The eGFR has been  calculated using the CKD EPI equation.     This calculation has not been validated in all clinical situations.     eGFR's persistently <90 mL/min signify possible Chronic Kidney     Disease.    Anion gap  15  5 - 15   I-STAT TROPOININ, ED Status: None    Collection Time    08/21/13 1:40 PM   Result  Value  Ref Range    Troponin i, poc  0.00  0.00 - 0.08 ng/mL    Comment 3      Comment:  Due to the release kinetics of cTnI,     a negative result within the first hours     of the onset of symptoms does not rule out     myocardial infarction with certainty.     If myocardial infarction is still suspected,     repeat the test at appropriate intervals.   I-STAT CHEM 8, ED Status: Abnormal    Collection Time    08/21/13 1:42 PM   Result  Value  Ref Range    Sodium  141  137 - 147 mEq/L    Potassium  4.0  3.7 - 5.3 mEq/L    Chloride  105  96 - 112 mEq/L    BUN  13  6 - 23 mg/dL    Creatinine, Ser  0.90  0.50 - 1.10 mg/dL    Glucose, Bld  103 (*)  70 - 99 mg/dL    Calcium, Ion  1.13  1.13 - 1.30 mmol/L    TCO2  24  0 - 100 mmol/L    Hemoglobin  15.6 (*)  12.0 - 15.0 g/dL    HCT  46.0  36.0 - 46.0 %   CBG MONITORING, ED Status: None    Collection Time    08/21/13 1:49 PM   Result  Value  Ref Range    Glucose-Capillary  93  70 - 99 mg/dL   PROTIME-INR Status: Abnormal    Collection Time    08/21/13 4:37 PM   Result  Value  Ref Range    Prothrombin Time  21.9 (*)  11.6 - 15.2 seconds    INR  1.91 (*)  0.00 - 1.49   URINE RAPID DRUG SCREEN (HOSP PERFORMED) Status: None    Collection Time    08/21/13 6:51 PM   Result  Value  Ref Range    Opiates  NONE DETECTED  NONE DETECTED    Cocaine  NONE DETECTED  NONE DETECTED    Benzodiazepines  NONE DETECTED  NONE DETECTED    Amphetamines  NONE DETECTED  NONE DETECTED    Tetrahydrocannabinol  NONE DETECTED  NONE DETECTED    Barbiturates  NONE DETECTED  NONE DETECTED    Comment:      DRUG SCREEN FOR MEDICAL PURPOSES     ONLY. IF  CONFIRMATION IS NEEDED     FOR ANY PURPOSE, NOTIFY LAB     WITHIN 5 DAYS.         LOWEST DETECTABLE LIMITS     FOR URINE DRUG SCREEN     Drug Class Cutoff (ng/mL)     Amphetamine 1000     Barbiturate 200     Benzodiazepine 200  Tricyclics 295     Opiates 300     Cocaine 300     THC 50   URINALYSIS, ROUTINE W REFLEX MICROSCOPIC Status: Abnormal    Collection Time    08/21/13 6:51 PM   Result  Value  Ref Range    Color, Urine  YELLOW  YELLOW    APPearance  CLEAR  CLEAR    Specific Gravity, Urine  1.042 (*)  1.005 - 1.030    pH  7.0  5.0 - 8.0    Glucose, UA  NEGATIVE  NEGATIVE mg/dL    Hgb urine dipstick  MODERATE (*)  NEGATIVE    Bilirubin Urine  NEGATIVE  NEGATIVE    Ketones, ur  NEGATIVE  NEGATIVE mg/dL    Protein, ur  NEGATIVE  NEGATIVE mg/dL    Urobilinogen, UA  0.2  0.0 - 1.0 mg/dL    Nitrite  NEGATIVE  NEGATIVE    Leukocytes, UA  NEGATIVE  NEGATIVE   URINE MICROSCOPIC-ADD ON Status: Abnormal    Collection Time    08/21/13 6:51 PM   Result  Value  Ref Range    Squamous Epithelial / LPF  FEW (*)  RARE    WBC, UA  0-2  <3 WBC/hpf    RBC / HPF  7-10  <3 RBC/hpf    Bacteria, UA  RARE  RARE   HEMOGLOBIN A1C Status: Abnormal    Collection Time    08/22/13 5:44 AM   Result  Value  Ref Range    Hemoglobin A1C  6.2 (*)  <5.7 %    Comment:  (NOTE)         According to the ADA Clinical Practice Recommendations for 2011, when     HbA1c is used as a screening test:     >=6.5% Diagnostic of Diabetes Mellitus     (if abnormal result is confirmed)     5.7-6.4% Increased risk of developing Diabetes Mellitus     References:Diagnosis and Classification of Diabetes Mellitus,Diabetes     AOZH,0865,78(IONGE 1):S62-S69 and Standards of Medical Care in     Diabetes - 2011,Diabetes Care,2011,34 (Suppl 1):S11-S61.    Mean Plasma Glucose  131 (*)  <117 mg/dL    Comment:  Performed at [facility] Status: Abnormal    Collection Time    08/22/13 5:44 AM    Result  Value  Ref Range    Cholesterol  207 (*)  0 - 200 mg/dL    Triglycerides  199 (*)  <150 mg/dL    HDL  43  >39 mg/dL    Total CHOL/HDL Ratio  4.8     VLDL  40  0 - 40 mg/dL    LDL Cholesterol  124 (*)  0 - 99 mg/dL    Comment:      Total Cholesterol/HDL:CHD Risk     Coronary Heart Disease Risk Table     Men Women     1/2 Average Risk 3.4 3.3     Average Risk 5.0 4.4     2 X Average Risk 9.6 7.1     3 X Average Risk 23.4 11.0         Use the calculated Patient Ratio     above and the CHD Risk Table     to determine the patient's CHD Risk.         ATP III CLASSIFICATION (LDL):     <100 mg/dL Optimal     100-129  mg/dL Near or Above     Optimal     130-159 mg/dL Borderline     160-189 mg/dL High     >190 mg/dL Very High   TROPONIN I Status: None    Collection Time    08/22/13 9:50 AM   Result  Value  Ref Range    Troponin I  <0.30  <0.30 ng/mL    Comment:      Due to the release kinetics of cTnI,     a negative result within the first hours     of the onset of symptoms does not rule out     myocardial infarction with certainty.     If myocardial infarction is still suspected,     repeat the test at appropriate intervals.   TROPONIN I Status: None    Collection Time    08/22/13 2:50 PM   Result  Value  Ref Range    Troponin I  <0.30  <0.30 ng/mL    Comment:      Due to the release kinetics of cTnI,     a negative result within the first hours     of the onset of symptoms does not rule out     myocardial infarction with certainty.     If myocardial infarction is still suspected,     repeat the test at appropriate intervals.   TROPONIN I Status: None    Collection Time    08/22/13 7:20 PM   Result  Value  Ref Range    Troponin I  <0.30  <0.30 ng/mL    Comment:      Due to the release kinetics of cTnI,     a negative result within the first hours     of the onset of symptoms does not rule out     myocardial infarction with certainty.     If myocardial  infarction is still suspected,     repeat the test at appropriate intervals.   BASIC METABOLIC PANEL Status: Abnormal    Collection Time    08/23/13 7:30 AM   Result  Value  Ref Range    Sodium  143  137 - 147 mEq/L    Potassium  4.4  3.7 - 5.3 mEq/L    Chloride  102  96 - 112 mEq/L    CO2  25  19 - 32 mEq/L    Glucose, Bld  100 (*)  70 - 99 mg/dL    BUN  11  6 - 23 mg/dL    Creatinine, Ser  0.94  0.50 - 1.10 mg/dL    Calcium  9.2  8.4 - 10.5 mg/dL    GFR calc non Af Amer  53 (*)  >90 mL/min    GFR calc Af Amer  61 (*)  >90 mL/min    Comment:  (NOTE)     The eGFR has been calculated using the CKD EPI equation.     This calculation has not been validated in all clinical situations.     eGFR's persistently <90 mL/min signify possible Chronic Kidney     Disease.    Anion gap  16 (*)  5 - 15   CBC Status: None    Collection Time    08/23/13 7:30 AM   Result  Value  Ref Range    WBC  8.8  4.0 - 10.5 K/uL    RBC  4.54  3.87 - 5.11 MIL/uL    Hemoglobin  14.3  12.0 - 15.0 g/dL    HCT  43.0  36.0 - 46.0 %    MCV  94.7  78.0 - 100.0 fL    MCH  31.5  26.0 - 34.0 pg    MCHC  33.3  30.0 - 36.0 g/dL    RDW  14.4  11.5 - 15.5 %    Platelets  341  150 - 400 K/uL   PROTIME-INR Status: Abnormal    Collection Time    08/23/13 7:30 AM   Result  Value  Ref Range    Prothrombin Time  23.2 (*)  11.6 - 15.2 seconds    INR  2.06 (*)  0.00 - 1.49   TSH Status: None    Collection Time    08/23/13 9:39 AM   Result  Value  Ref Range    TSH  4.040  0.350 - 4.500 uIU/mL     HPI :*  80 y.o. female history of atrial fibrillation on Coumadin, hypertension and hyperlipidemia who was brought to the emergency room following acute onset of left-sided weakness and numbness which started about 11:30 AM today. She has no previous history of stroke nor TIA. She had no changes in speech and no noticeable changes in mental status. CT scan of her head showed no acute intracranial abnormality. Subsequent CTA  showed no intracranial large vessel occlusion. INR was 1.82. NIH stroke score was 7.    HOSPITAL COURSE:  Possible CVA  presenting with left-sided weakness and numbness. Initial imaging does not confirm new stroke, however, she cannot have MRI due to pacer. She still has left arm symptoms. Suspect right brain embolic infarct secondary to known atrial fibrillation with subtherapeutic INR. However, there are functional component present, likely due to recent stress. On warfarin prior to admission; INR 1.82 on arrival, pt states she has been compliant. Now on warfarin for secondary stroke prevention given no sign of large ischemic stroke. Cannot have an MRI because of pacemaker .CT angiogram of the head and neck is negative  Hypertension. BP 122-182/53-137  Paroxysmal atrial fibrillation. EP recommends change to eliquis. Discussed with pt/family. On home bisoprolol and diltiazem.  Hyperlipidemia, LDL 124, on no statin because of weakness related to Lipitor, will place the patient on simvastatin, goal LDL < 100 (< 70 for diabetics)  A1C 6.2, within the goal  Morbid Obesity, Body mass index is 40.04 kg/(m^2).  Stress related to recent death of her husband 2 weeks ago, may be exacerbating her stroke symptoms  PT recommended CIR, rolling walker  Speech therapy did not identify any current needs  However, pt reported that her INR was stable at therapeutic level at home, did not feel difficulty to control INR, get used to the life style while using coumadin and given her age, continue coumadin is also a good option. Had long discussion with pt and family regarding options on anticoagulation, they are leaning towards coumadin. If change to NOAC, recommend eliquis $RemoveBeforeDEI'5mg'nySQpOAwpboiQjWw$  bid  Neurology recommended repeating the CAT scan on 7/24,  CT shows Stable appearance of the head: Involutional changes, mild white  matter changes suggest chronic small vessel ischemic disease. 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  INR  therapeutic at 2.38 , goal INR 2-3 Recommend d/c regimen to be $Rem'4mg'mOvi$  MWF and $Remov'3mg'eqZOtl$  TTSS Will need to hold coumadin ,and bridge with heparin gtt prior to cataract surgery   Atrial fibrillation  INR subtherapeutic upon admission  Seen by Dr. Rayann Heman, pacemaker interrogated, pacemaker functioning normally,  Given labile INR leading  to stroke, cardiology would strongly advise consideration of a NOAC.  recommend eliquis $RemoveBeforeDEI'5mg'MgnLYsPDqSldxCdx$  bid  Patient prefers to stay on Coumadin for now  Continue home bisoprolol and diltiazem dosing. Can titrate diltiazem if needed per cardiology. Goal heart rates should be mostly < 100 bpm  Decreased diltiazem to 120 mg a day    hypertension , controlled currently  Uncontrolled upon admission, on bisoprolol, diltiazem, Imdur  Decreased diltiazem to 120 mg a day  Also on Lasix at home that can be continued as a daily or every other day  Diarrhea 1 isolated episode of diarrhea this morning prior to discharge Doubt that this is C. difficile Check C. difficile PCR if diarrhea continues   Discharge Exam: *  Blood pressure 134/72, pulse 69, temperature 97.8 F (36.6 C), temperature source Oral, resp. rate 18, height 5' (1.524 m), weight 93 kg (205 lb 0.4 oz), SpO2 95.00%.  CARD: RRR; S1 and S2 appreciated; no murmurs, no clicks, no rubs, no gallops  RESP: Normal chest excursion without splinting or tachypnea; breath sounds clear and equal bilaterally; no wheezes, no rhonchi, no rales,  ABD/GI: Normal bowel sounds; non-distended; soft, non-tender, no rebound, no guarding  BACK: The back appears normal and is non-tender to palpation, there is no CVA tenderness  EXT: Normal ROM in all joints; non-tender to palpation; no edema; normal capillary refill; no cyanosis  SKIN: Normal color for age and race; warm  NEURO: Patient has left-sided upper and lower extremity weakness and pronator drift, almost no grip strength the left upper extremity, sensation to light touch intact diffusely,  strength 5/5 in her right upper and lower extremity, cranial nerves II through XII intact, no slurred speech, NIH stroke scale 7      Discharge Instructions    Diet - low sodium heart healthy  Complete by: As directed       Increase activity slowly  Complete by: As directed            Follow-up Information    Schedule an appointment as soon as possible for a visit to follow up.       Follow up with HODGES,FRANCISCO, MD. Schedule an appointment as soon as possible for a visit in 1 week.    Specialty: Family Medicine       Follow up with Thompson Grayer, MD. Schedule an appointment as soon as possible for a visit in 2 weeks.    Specialty: Cardiology    Contact information:    [address]  [place]  [place] [address]  [phone]

## 2013-08-25 NOTE — Progress Notes (Signed)
Patient is medically stable for D/C to [facility] and rehab today. Per Environmental education officer at [place] patient is going to a private room on station 1. Clinical Child psychotherapist (CSW) prepared D/C packet and arranged EMS for transport. CSW made [facility] admissions coordinator at [place] aware of above. Patient's daughter is at bedside and aware of above. Nursing is aware of above. Please reconsult if future social work needs arise. CSW signing off.   Jetta Lout, LCSWA Weekend CSW [phone]

## 2013-09-25 ENCOUNTER — Ambulatory Visit: Payer: Medicare HMO | Admitting: Physician Assistant

## 2013-11-12 ENCOUNTER — Encounter: Payer: Self-pay | Admitting: Cardiology

## 2014-03-03 DIAGNOSIS — I4891 Unspecified atrial fibrillation: Secondary | ICD-10-CM | POA: Diagnosis not present

## 2014-03-11 ENCOUNTER — Other Ambulatory Visit: Payer: Self-pay | Admitting: Internal Medicine

## 2014-03-21 ENCOUNTER — Emergency Department (HOSPITAL_COMMUNITY): Payer: Commercial Managed Care - HMO

## 2014-03-21 ENCOUNTER — Encounter (HOSPITAL_COMMUNITY): Payer: Self-pay | Admitting: *Deleted

## 2014-03-21 ENCOUNTER — Observation Stay (HOSPITAL_COMMUNITY)
Admission: EM | Admit: 2014-03-21 | Discharge: 2014-03-22 | Disposition: A | Discharge status: Home / Self Care | Payer: Commercial Managed Care - HMO | Attending: Internal Medicine | Admitting: Internal Medicine

## 2014-03-21 DIAGNOSIS — I480 Paroxysmal atrial fibrillation: Secondary | ICD-10-CM | POA: Insufficient documentation

## 2014-03-21 DIAGNOSIS — I481 Persistent atrial fibrillation: Secondary | ICD-10-CM | POA: Diagnosis not present

## 2014-03-21 DIAGNOSIS — I10 Essential (primary) hypertension: Secondary | ICD-10-CM | POA: Insufficient documentation

## 2014-03-21 DIAGNOSIS — R079 Chest pain, unspecified: Secondary | ICD-10-CM | POA: Diagnosis not present

## 2014-03-21 DIAGNOSIS — Z7901 Long term (current) use of anticoagulants: Secondary | ICD-10-CM | POA: Diagnosis not present

## 2014-03-21 DIAGNOSIS — I5032 Chronic diastolic (congestive) heart failure: Secondary | ICD-10-CM | POA: Diagnosis not present

## 2014-03-21 DIAGNOSIS — R110 Nausea: Secondary | ICD-10-CM | POA: Insufficient documentation

## 2014-03-21 DIAGNOSIS — R911 Solitary pulmonary nodule: Secondary | ICD-10-CM | POA: Diagnosis present

## 2014-03-21 DIAGNOSIS — B349 Viral infection, unspecified: Secondary | ICD-10-CM | POA: Diagnosis not present

## 2014-03-21 DIAGNOSIS — K8020 Calculus of gallbladder without cholecystitis without obstruction: Secondary | ICD-10-CM | POA: Diagnosis not present

## 2014-03-21 DIAGNOSIS — M810 Age-related osteoporosis without current pathological fracture: Secondary | ICD-10-CM | POA: Insufficient documentation

## 2014-03-21 DIAGNOSIS — Z950 Presence of cardiac pacemaker: Secondary | ICD-10-CM | POA: Insufficient documentation

## 2014-03-21 DIAGNOSIS — K219 Gastro-esophageal reflux disease without esophagitis: Secondary | ICD-10-CM | POA: Diagnosis not present

## 2014-03-21 DIAGNOSIS — E785 Hyperlipidemia, unspecified: Secondary | ICD-10-CM | POA: Insufficient documentation

## 2014-03-21 DIAGNOSIS — E669 Obesity, unspecified: Secondary | ICD-10-CM | POA: Diagnosis not present

## 2014-03-21 DIAGNOSIS — I482 Chronic atrial fibrillation, unspecified: Secondary | ICD-10-CM | POA: Diagnosis present

## 2014-03-21 DIAGNOSIS — Z79899 Other long term (current) drug therapy: Secondary | ICD-10-CM | POA: Insufficient documentation

## 2014-03-21 DIAGNOSIS — R072 Precordial pain: Secondary | ICD-10-CM | POA: Diagnosis not present

## 2014-03-21 DIAGNOSIS — I2510 Atherosclerotic heart disease of native coronary artery without angina pectoris: Secondary | ICD-10-CM | POA: Diagnosis not present

## 2014-03-21 DIAGNOSIS — R6883 Chills (without fever): Secondary | ICD-10-CM | POA: Diagnosis present

## 2014-03-21 DIAGNOSIS — R197 Diarrhea, unspecified: Secondary | ICD-10-CM | POA: Diagnosis present

## 2014-03-21 LAB — COMPREHENSIVE METABOLIC PANEL
ALT: 11 U/L (ref 0–35)
ANION GAP: 9 (ref 5–15)
AST: 25 U/L (ref 0–37)
Albumin: 3.4 g/dL — ABNORMAL LOW (ref 3.5–5.2)
Alkaline Phosphatase: 129 U/L — ABNORMAL HIGH (ref 39–117)
BILIRUBIN TOTAL: 1.2 mg/dL (ref 0.3–1.2)
BUN: 13 mg/dL (ref 6–23)
CALCIUM: 8.7 mg/dL (ref 8.4–10.5)
CO2: 23 mmol/L (ref 19–32)
CREATININE: 0.88 mg/dL (ref 0.50–1.10)
Chloride: 105 mmol/L (ref 96–112)
GFR calc Af Amer: 66 mL/min — ABNORMAL LOW (ref >90)
GFR, EST NON AFRICAN AMERICAN: 57 mL/min — AB (ref >90)
Glucose, Bld: 116 mg/dL — ABNORMAL HIGH (ref 70–99)
POTASSIUM: 4.3 mmol/L (ref 3.5–5.1)
SODIUM: 137 mmol/L (ref 135–145)
Total Protein: 6.8 g/dL (ref 6.0–8.3)

## 2014-03-21 LAB — URINALYSIS, ROUTINE W REFLEX MICROSCOPIC
Glucose, UA: NEGATIVE mg/dL
KETONES UR: NEGATIVE mg/dL
LEUKOCYTES UA: NEGATIVE
NITRITE: NEGATIVE
PROTEIN: NEGATIVE mg/dL
Specific Gravity, Urine: 1.028 (ref 1.005–1.030)
UROBILINOGEN UA: 1 mg/dL (ref 0.0–1.0)
pH: 5 (ref 5.0–8.0)

## 2014-03-21 LAB — CBC WITH DIFFERENTIAL/PLATELET
BASOS PCT: 0 % (ref 0–1)
Basophils Absolute: 0 10*3/uL (ref 0.0–0.1)
Eosinophils Absolute: 0.1 10*3/uL (ref 0.0–0.7)
Eosinophils Relative: 1 % (ref 0–5)
HEMATOCRIT: 43.4 % (ref 36.0–46.0)
Hemoglobin: 14.3 g/dL (ref 12.0–15.0)
LYMPHS ABS: 1.3 10*3/uL (ref 0.7–4.0)
Lymphocytes Relative: 11 % — ABNORMAL LOW (ref 12–46)
MCH: 30.4 pg (ref 26.0–34.0)
MCHC: 32.9 g/dL (ref 30.0–36.0)
MCV: 92.3 fL (ref 78.0–100.0)
MONOS PCT: 8 % (ref 3–12)
Monocytes Absolute: 0.9 10*3/uL (ref 0.1–1.0)
NEUTROS ABS: 9.6 10*3/uL — AB (ref 1.7–7.7)
Neutrophils Relative %: 80 % — ABNORMAL HIGH (ref 43–77)
PLATELETS: 316 10*3/uL (ref 150–400)
RBC: 4.7 MIL/uL (ref 3.87–5.11)
RDW: 15 % (ref 11.5–15.5)
WBC: 12 10*3/uL — ABNORMAL HIGH (ref 4.0–10.5)

## 2014-03-21 LAB — URINE MICROSCOPIC-ADD ON

## 2014-03-21 LAB — TROPONIN I: Troponin I: <0.03 ng/mL (ref <0.031)

## 2014-03-21 LAB — PROTIME-INR
INR: 1.75 — ABNORMAL HIGH (ref 0.00–1.49)
Prothrombin Time: 20.6 seconds — ABNORMAL HIGH (ref 11.6–15.2)

## 2014-03-21 LAB — LIPASE, BLOOD: Lipase: 26 U/L (ref 11–59)

## 2014-03-21 MED ORDER — ROPINIROLE HCL 0.5 MG PO TABS
0.5000 mg | ORAL_TABLET | Freq: Every day | ORAL | Status: DC
  Administered 2014-03-21: 0.5 mg via ORAL
  Filled 2014-03-21: qty 1

## 2014-03-21 MED ORDER — ISOSORBIDE MONONITRATE ER 30 MG PO TB24
30.0000 mg | ORAL_TABLET | Freq: Every day | ORAL | Status: DC
  Administered 2014-03-22: 30 mg via ORAL
  Filled 2014-03-21: qty 1

## 2014-03-21 MED ORDER — ONDANSETRON HCL 4 MG PO TABS: 4.0000 mg | ORAL_TABLET | Freq: Four times a day (QID) | ORAL | Status: DC | PRN

## 2014-03-21 MED ORDER — PANTOPRAZOLE SODIUM 40 MG PO TBEC
40.0000 mg | DELAYED_RELEASE_TABLET | Freq: Every day | ORAL | Status: DC
  Administered 2014-03-22: 40 mg via ORAL
  Filled 2014-03-21: qty 1

## 2014-03-21 MED ORDER — DILTIAZEM HCL ER COATED BEADS 120 MG PO CP24
120.0000 mg | ORAL_CAPSULE | Freq: Every day | ORAL | Status: DC
  Administered 2014-03-22: 120 mg via ORAL
  Filled 2014-03-21: qty 1

## 2014-03-21 MED ORDER — PROMETHAZINE HCL 25 MG/ML IJ SOLN
12.5000 mg | Freq: Once | INTRAMUSCULAR | Status: AC
  Administered 2014-03-21: 12.5 mg via INTRAVENOUS
  Filled 2014-03-21: qty 1

## 2014-03-21 MED ORDER — ACETAMINOPHEN 325 MG PO TABS
650.0000 mg | ORAL_TABLET | Freq: Once | ORAL | Status: AC
  Administered 2014-03-21: 650 mg via ORAL
  Filled 2014-03-21: qty 2

## 2014-03-21 MED ORDER — SODIUM CHLORIDE 0.9 % IV BOLUS (SEPSIS)
250.0000 mL | Freq: Once | INTRAVENOUS | Status: AC
  Administered 2014-03-21: 250 mL via INTRAVENOUS

## 2014-03-21 MED ORDER — WARFARIN - PHARMACIST DOSING INPATIENT: Freq: Every day | Status: DC

## 2014-03-21 MED ORDER — PANTOPRAZOLE SODIUM 40 MG PO TBEC: 40.0000 mg | DELAYED_RELEASE_TABLET | Freq: Every day | ORAL | Status: DC

## 2014-03-21 MED ORDER — BISOPROLOL FUMARATE 10 MG PO TABS
10.0000 mg | ORAL_TABLET | Freq: Two times a day (BID) | ORAL | Status: DC
  Administered 2014-03-21 – 2014-03-22 (×2): 10 mg via ORAL
  Filled 2014-03-21 (×3): qty 1

## 2014-03-21 MED ORDER — ONDANSETRON HCL 4 MG/2ML IJ SOLN: 4.0000 mg | Freq: Four times a day (QID) | INTRAMUSCULAR | Status: DC | PRN

## 2014-03-21 MED ORDER — PRAVASTATIN SODIUM 40 MG PO TABS
40.0000 mg | ORAL_TABLET | Freq: Every day | ORAL | Status: DC
  Administered 2014-03-22: 40 mg via ORAL
  Filled 2014-03-21: qty 1

## 2014-03-21 MED ORDER — IOHEXOL 350 MG/ML SOLN
100.0000 mL | Freq: Once | INTRAVENOUS | Status: AC | PRN
  Administered 2014-03-21: 100 mL via INTRAVENOUS

## 2014-03-21 MED ORDER — SODIUM CHLORIDE 0.9 % IV SOLN
INTRAVENOUS | Status: DC
  Administered 2014-03-21: 75 mL/h via INTRAVENOUS

## 2014-03-21 MED ORDER — ONDANSETRON HCL 4 MG/2ML IJ SOLN
4.0000 mg | Freq: Once | INTRAMUSCULAR | Status: AC
  Administered 2014-03-21: 4 mg via INTRAVENOUS
  Filled 2014-03-21: qty 2

## 2014-03-21 MED ORDER — ONDANSETRON HCL 4 MG/2ML IJ SOLN: 4.0000 mg | Freq: Three times a day (TID) | INTRAMUSCULAR | Status: AC | PRN

## 2014-03-21 MED ORDER — WARFARIN SODIUM 3 MG PO TABS
3.0000 mg | ORAL_TABLET | Freq: Once | ORAL | Status: AC
  Administered 2014-03-21: 3 mg via ORAL
  Filled 2014-03-21 (×2): qty 1

## 2014-03-21 NOTE — ED Notes (Signed)
The pt took a zofran for nausea just pta

## 2014-03-21 NOTE — ED Provider Notes (Signed)
CSN: [id number]     Arrival date & time 03/21/14  1612 History   First MD Initiated Contact with Patient 03/21/14 1721     Chief Complaint  Patient presents with  . Chills     (Consider location/radiation/quality/duration/timing/severity/associated sxs/prior Treatment) Patient is a 81 y.o. female presenting with chest pain. The history is provided by the patient.  Chest Pain Pain location:  Substernal area Pain quality: pressure   Pain radiates to:  Upper back Pain radiates to the back: yes   Pain severity:  Mild Onset quality:  Gradual Timing:  Constant Progression:  Unchanged Chronicity:  New Context: at rest   Relieved by:  Nothing Worsened by:  Nothing tried Ineffective treatments:  None tried Associated symptoms: nausea   Associated symptoms: no abdominal pain, no back pain, no cough, no dizziness, no fatigue, no fever, no headache, no shortness of breath and not vomiting     Past Medical History  Diagnosis Date  . Paroxysmal atrial fibrillation     tachy-brady syndrome  . Hypertension   . Hyperlipidemia   . GERD (gastroesophageal reflux disease)   . Chest pain     with cath 2002 and 2004 showing mild non-obstructive CAD  . Osteoporosis   . Dyspnea     chronic; myoview 11/9. EF 81% probable soft-tissue attenuation in anterior and lat walls, can't exclude ischemia  . Obesity    Past Surgical History  Procedure Laterality Date  . Cardiac catheterization    . Breast lumpectomy      right  . Total abdominal hysterectomy w/ bilateral salpingoophorectomy  1974  . Colectomy  1991  . Pacemaker insertion      [employer]   Family History  Problem Relation Age of Onset  . Cancer Other   . Heart disease Other   . Hypertension Other    History  Substance Use Topics  . Smoking status: Never Smoker   . Smokeless tobacco: Not on file  . Alcohol Use: No   OB History    No data available     Review of Systems  Constitutional: Negative for fever and fatigue.   HENT: Negative for congestion and drooling.   Eyes: Negative for pain.  Respiratory: Negative for cough and shortness of breath.   Cardiovascular: Positive for chest pain.  Gastrointestinal: Positive for nausea. Negative for vomiting, abdominal pain and diarrhea.  Genitourinary: Negative for dysuria and hematuria.  Musculoskeletal: Negative for back pain, gait problem and neck pain.  Skin: Negative for color change.  Neurological: Negative for dizziness and headaches.  Hematological: Negative for adenopathy.  Psychiatric/Behavioral: Negative for behavioral problems.  All other systems reviewed and are negative.     Allergies  Morphine and related  Home Medications   Prior to Admission medications   Medication Sig Start Date End Date Taking? Authorizing Provider  bisoprolol (ZEBETA) 5 MG tablet Take 2 tablets (10 mg total) by mouth 2 (two) times daily. 08/23/13   Richarda Overlie, MD  calcium carbonate (TUMS) 500 MG chewable tablet Chew 1 tablet by mouth daily as needed for indigestion or heartburn.    Historical Provider, MD  diltiazem (CARDIZEM CD) 120 MG 24 hr capsule Take 1 capsule (120 mg total) by mouth daily. 08/23/13   Richarda Overlie, MD  furosemide (LASIX) 20 MG tablet Take 40 mg by mouth daily.    Historical Provider, MD  isosorbide mononitrate (IMDUR) 30 MG 24 hr tablet Take 30 mg by mouth daily.    Historical Provider, MD  isosorbide mononitrate (IMDUR) 30 MG 24 hr tablet TAKE 1 TABLET ONCE DAILY. 03/12/14   Duke Salvia, MD  omeprazole (PRILOSEC) 20 MG capsule Take 20 mg by mouth daily.      Historical Provider, MD  potassium chloride SA (K-DUR,KLOR-CON) 20 MEQ tablet Take 1 tablet (20 mEq total) by mouth daily. 12/03/12   Aundria Rud, NP  pravastatin (PRAVACHOL) 40 MG tablet Take 1 tablet (40 mg total) by mouth daily. 08/23/13   Richarda Overlie, MD  rOPINIRole (REQUIP) 0.5 MG tablet Take 0.5 mg by mouth at bedtime.    Historical Provider, MD  warfarin (COUMADIN) 2 MG tablet  Recommend d/c regimen to be 4mg  MWF and 3mg  TTSS 08/25/13   , MD   BP 138/80 mmHg  Pulse 62  Temp(Src) 99.4 F (37.4 C) (Oral)  Resp 26  SpO2 94% Physical Exam  Constitutional: She is oriented to person, place, and time. She appears well-developed and well-nourished.  HENT:  Head: Normocephalic.  Mouth/Throat: Oropharynx is clear and moist. No oropharyngeal exudate.  Eyes: Conjunctivae and EOM are normal. Pupils are equal, round, and reactive to light.  Neck: Normal range of motion. Neck supple.  Cardiovascular: Normal rate, regular rhythm, normal heart sounds and intact distal pulses.  Exam reveals no gallop and no friction rub.   No murmur heard. Pulmonary/Chest: Effort normal and breath sounds normal. No respiratory distress. She has no wheezes.  Abdominal: Soft. Bowel sounds are normal. There is no tenderness. There is no rebound and no guarding.  Musculoskeletal: Normal range of motion. She exhibits no edema or tenderness.  Normal strength and sensation in the upper and lower extremities.  2+ distal pulses which are equal in all extremities.  Neurological: She is alert and oriented to person, place, and time.  Skin: Skin is warm and dry.  Psychiatric: She has a normal mood and affect. Her behavior is normal.  Nursing note and vitals reviewed.   ED Course  Procedures (including critical care time) Labs Review Labs Reviewed  CBC WITH DIFFERENTIAL/PLATELET - Abnormal; Notable for the following:    WBC 12.0 (*)    Neutrophils Relative % 80 (*)    Neutro Abs 9.6 (*)    Lymphocytes Relative 11 (*)    All other components within normal limits  COMPREHENSIVE METABOLIC PANEL - Abnormal; Notable for the following:    Glucose, Bld 116 (*)    Albumin 3.4 (*)    Alkaline Phosphatase 129 (*)    GFR calc non Af Amer 57 (*)    GFR calc Af Amer 66 (*)    All other components within normal limits  URINALYSIS, ROUTINE W REFLEX MICROSCOPIC - Abnormal; Notable for the  following:    Hgb urine dipstick LARGE (*)    Bilirubin Urine SMALL (*)    All other components within normal limits  URINE MICROSCOPIC-ADD ON - Abnormal; Notable for the following:    Squamous Epithelial / LPF FEW (*)    All other components within normal limits  URINE CULTURE  LIPASE, BLOOD  TROPONIN I  PROTIME-INR    Imaging Review No results found.   EKG Interpretation   Date/Time:  Friday March 21 2014 16:20:08 EST Ventricular Rate:  65 PR Interval:    QRS Duration: 74 QT Interval:  428 QTC Calculation: 445 R Axis:   59 Text Interpretation:  Demand pacemaker; interpretation is based on  intrinsic rhythm Atrial fibrillation with premature ventricular or  aberrantly conducted complexes RSR' or QR pattern  in V1 suggests right  ventricular conduction delay ST abnormality, possible digitalis effect  Confirmed by Jeramy Dimmick  MD, Onica Davidovich ([id number]) on 03/21/2014 5:34:33 PM      MDM   Final diagnoses:  Chest pain  Nausea    5:48 PM 81 y.o. female w hx of afib on coumadin, HTN, HLP, pacemaker who presents with chest pain. She states that this morning she developed chills and nausea. She denies any emesis. Around lunchtime she developed a sharp fleeting pain under her left breast which turned into a pressure on her central chest and radiated to her back. While the chest pain only lasted a few minutes the pressure in her upper back persisted throughout the day. She currently rates it an 8 out of 10. She would like Tylenol for her pain. She is afebrileand vital signs are unremarkable here. We'll get screening labs and CT scan chest and abdomen given radiation of pain to back.  Will admit to hospitalist.     Purvis Sheffield, MD 03/21/14 [phone]

## 2014-03-21 NOTE — ED Notes (Signed)
Pt O2sats 89% ra. O2 2lpm via n/c applied.

## 2014-03-21 NOTE — ED Notes (Signed)
MD at bedside. 

## 2014-03-21 NOTE — Progress Notes (Signed)
ANTICOAGULATION CONSULT NOTE - Initial Consult  Pharmacy Consult for Coumadin Indication: atrial fibrillation  Allergies  Allergen Reactions  . Morphine And Related Other (See Comments)    hallucinations    Patient Measurements: TBW - 93 kg as of 07/2013  Vital Signs: Temp: 98.9 F (37.2 C) (02/19 2029) Temp Source: Rectal (02/19 2029) BP: 125/70 mmHg (02/19 1945) Pulse Rate: 66 (02/19 1945)  Labs:  Recent Labs  03/21/14 1630  HGB 14.3  HCT 43.4  PLT 316  LABPROT 20.6*  INR 1.75*  CREATININE 0.88  TROPONINI <0.03    CrCl cannot be calculated (Unknown ideal weight.).   Medical History: Past Medical History  Diagnosis Date  . Paroxysmal atrial fibrillation     tachy-brady syndrome  . Hypertension   . Hyperlipidemia   . GERD (gastroesophageal reflux disease)   . Chest pain     with cath 2002 and 2004 showing mild non-obstructive CAD  . Osteoporosis   . Dyspnea     chronic; myoview 11/9. EF 81% probable soft-tissue attenuation in anterior and lat walls, can't exclude ischemia  . Obesity     Medications:   (Not in a hospital admission)  Assessment: 81 yo F presents on 2/19 with chest pain. PMH includes Afib, HTN, HLD, GERD, and obesity. Pharmacy to continue coumadin for Afib while inpatient. Coumadin dose recently changed by MD. Pt has not taken dose today. INR on admission is subtherapeutic at 1.75. CBC stable. No s/s of bleed.  PTA Coumadin is 3mg  daily exc 1.5mg  on Tues/Thurs  Goal of Therapy:  INR 2-3 Monitor platelets by anticoagulation protocol: Yes   Plan:  Give coumadin 3mg  PO x 1 tonight Monitor daily INR, CBC, s/s of bleed  Alexsis Branscom J 03/21/2014,8:52 PM

## 2014-03-21 NOTE — ED Notes (Signed)
The pt has had chills  With n v since this am followed by generalized chest pain this afternoon and she hurts through to her posterior chest both sides.  Epigastric pain

## 2014-03-21 NOTE — ED Notes (Signed)
The pt reports that she stays sob she is in af chrionically

## 2014-03-21 NOTE — ED Notes (Signed)
Report given to the floor, RN ask for 10 -15 min wait to transfer pt up, room is not ready yet they are cleaning the room. Pt changed to room [id number].

## 2014-03-21 NOTE — H&P (Signed)
[facility] History and Physical  [name] WYO:[id number] DOB: Jun 02, 1923 DOA: 03/21/2014  Referring physician: EDP PCP: Charlott Rakes, MD   Chief Complaint: chest pain   HPI: [name] is a 81 y.o. female who presents to the ED with upper back pain and substernal chest pain.  Symptoms onset earlier today, associated with nausea, diarrhea, runny nose.  Patient has a sick contact Engineer, building services) who visited her yesterday with similar symptoms of N/V/D.  Nothing makes symptoms better or worse.  No actual vomiting yet, just nausea.  Review of Systems: No SOB, does have mild dry cough that is long standing, no dizziness, no fever (Tm in ED is 99.4), Systems reviewed.  As above, otherwise negative  Past Medical History  Diagnosis Date  . Paroxysmal atrial fibrillation     tachy-brady syndrome  . Hypertension   . Hyperlipidemia   . GERD (gastroesophageal reflux disease)   . Chest pain     with cath 2002 and 2004 showing mild non-obstructive CAD  . Osteoporosis   . Dyspnea     chronic; myoview 11/9. EF 81% probable soft-tissue attenuation in anterior and lat walls, can't exclude ischemia  . Obesity    Past Surgical History  Procedure Laterality Date  . Cardiac catheterization    . Breast lumpectomy      right  . Total abdominal hysterectomy w/ bilateral salpingoophorectomy  1974  . Colectomy  1991  . Pacemaker insertion      [employer]  . Splenectomy     Social History:  reports that she has never smoked. She does not have any smokeless tobacco history on file. She reports that she does not drink alcohol or use illicit drugs.  Allergies  Allergen Reactions  . Morphine And Related Other (See Comments)    hallucinations    Family History  Problem Relation Age of Onset  . Cancer Other   . Heart disease Other   . Hypertension Other      Prior to Admission medications   Medication Sig Start Date End Date Taking? Authorizing Provider  bisoprolol (ZEBETA) 5 MG  tablet Take 2 tablets (10 mg total) by mouth 2 (two) times daily. 08/23/13   Richarda Overlie, MD  calcium carbonate (TUMS) 500 MG chewable tablet Chew 1 tablet by mouth daily as needed for indigestion or heartburn.    Historical Provider, MD  diltiazem (CARDIZEM CD) 120 MG 24 hr capsule Take 1 capsule (120 mg total) by mouth daily. 08/23/13   Richarda Overlie, MD  furosemide (LASIX) 20 MG tablet Take 40 mg by mouth daily.    Historical Provider, MD  isosorbide mononitrate (IMDUR) 30 MG 24 hr tablet Take 30 mg by mouth daily.    Historical Provider, MD  isosorbide mononitrate (IMDUR) 30 MG 24 hr tablet TAKE 1 TABLET ONCE DAILY. 03/12/14   Duke Salvia, MD  omeprazole (PRILOSEC) 20 MG capsule Take 20 mg by mouth daily.      Historical Provider, MD  potassium chloride SA (K-DUR,KLOR-CON) 20 MEQ tablet Take 1 tablet (20 mEq total) by mouth daily. 12/03/12   Aundria Rud, NP  pravastatin (PRAVACHOL) 40 MG tablet Take 1 tablet (40 mg total) by mouth daily. 08/23/13   Richarda Overlie, MD  rOPINIRole (REQUIP) 0.5 MG tablet Take 0.5 mg by mouth at bedtime.    Historical Provider, MD  warfarin (COUMADIN) 2 MG tablet Recommend d/c regimen to be 4mg  MWF and 3mg  TTSS 08/25/13   , MD   Physical  Exam: Filed Vitals:   03/21/14 2029  BP:   Pulse:   Temp: 98.9 F (37.2 C)  Resp:     BP 125/70 mmHg  Pulse 66  Temp(Src) 98.9 F (37.2 C) (Rectal)  Resp 24  SpO2 93%  General Appearance:    Alert, oriented, no distress, appears stated age  Head:    Normocephalic, atraumatic  Eyes:    PERRL, EOMI, sclera non-icteric        Nose:   Nares without drainage or epistaxis. Mucosa, turbinates normal  Throat:   Moist mucous membranes. Oropharynx without erythema or exudate.  Neck:   Supple. No carotid bruits.  No thyromegaly.  No lymphadenopathy.   Back:     No CVA tenderness, no spinal tenderness  Lungs:     Clear to auscultation bilaterally, without wheezes, rhonchi or rales  Chest wall:    No tenderness to  palpitation  Heart:    Regular rate and rhythm without murmurs, gallops, rubs  Abdomen:     Soft, non-tender, nondistended, normal bowel sounds, no organomegaly  Genitalia:    deferred  Rectal:    deferred  Extremities:   No clubbing, cyanosis or edema.  Pulses:   2+ and symmetric all extremities  Skin:   Skin color, texture, turgor normal, no rashes or lesions  Lymph nodes:   Cervical, supraclavicular, and axillary nodes normal  Neurologic:   CNII-XII intact. Normal strength, sensation and reflexes      throughout    Labs on Admission:  Basic Metabolic Panel:  Recent Labs Lab 03/21/14 1630  NA 137  K 4.3  CL 105  CO2 23  GLUCOSE 116*  BUN 13  CREATININE 0.88  CALCIUM 8.7   Liver Function Tests:  Recent Labs Lab 03/21/14 1630  AST 25  ALT 11  ALKPHOS 129*  BILITOT 1.2  PROT 6.8  ALBUMIN 3.4*    Recent Labs Lab 03/21/14 1630  LIPASE 26   No results for input(s): AMMONIA in the last 168 hours. CBC:  Recent Labs Lab 03/21/14 1630  WBC 12.0*  NEUTROABS 9.6*  HGB 14.3  HCT 43.4  MCV 92.3  PLT 316   Cardiac Enzymes:  Recent Labs Lab 03/21/14 1630  TROPONINI <0.03    BNP (last 3 results) No results for input(s): PROBNP in the last 8760 hours. CBG: No results for input(s): GLUCAP in the last 168 hours.  Radiological Exams on Admission: Ct Angio Chest Aorta W/cm &/or Wo/cm  03/21/2014   CLINICAL DATA:  Centralized chest pain.  Nausea and vomiting.  EXAM: CT ANGIOGRAPHY CHEST, ABDOMEN AND PELVIS  TECHNIQUE: Multidetector CT imaging through the chest, abdomen and pelvis was performed using the standard protocol during bolus administration of intravenous contrast. Multiplanar reconstructed images and MIPs were obtained and reviewed to evaluate the vascular anatomy.  CONTRAST:  OMNIPAQUE IOHEXOL 350 MG/ML SOLN  COMPARISON:  03/22/2012  FINDINGS: CTA CHEST FINDINGS  Mediastinum: Normal heart size. No pericardial effusion. Calcified atherosclerotic  plaque involves the thoracic aorta as well as the LAD and RCA Coronary artery. There is no dissection. The main pulmonary artery appears patent. No proximal pulmonary artery embolus identified. There is a large hiatal hernia. The trachea appears patent and is midline.  Lungs/Pleura: There is no pleural effusion identified. No airspace consolidation identified. Ground-glass nodule in the right upper lobe measures 4 mm, image 21/series 6. Several tiny peripheral nodules in the left upper lobe are noted measuring up to 3 mm.  Musculoskeletal: The bones  appear diffusely osteopenic. No acute abnormality identified.  Review of the MIP images confirms the above findings.  CTA ABDOMEN AND PELVIS FINDINGS  Hepatobiliary: No focal liver abnormality identified. 7 mm gallstone is identified. There is mild fusiform dilatation of the common bile duct which measures up to 8 mm. The pancreatic duct has a normal caliber.  Pancreas: Negative.  Spleen: Previous splenectomy.  Adrenals/Urinary Tract: Normal adrenal glands. Mild scarring involves the left kidney. There is a cyst in the right kidney measuring 1.6 cm. The urinary bladder appears normal.  Stomach/Bowel: Large hiatal hernia as above. The small bowel loops have a normal caliber without evidence for obstruction. Normal appearance of the colon no evidence for bowel obstruction.  Vascular/Lymphatic: Calcified atherosclerotic disease involves the abdominal aorta. No aneurysm or evidence of dissection. No enlarged retroperitoneal or mesenteric lymph nodes.  Reproductive: Previous hysterectomy.  No adnexal mass noted.  Other: There is no ascites or focal fluid collections within the abdomen or pelvis.  Musculoskeletal: Review of the visualized osseous structures is significant for diffuse osteopenia. There is a kyphosis deformity which is end around T10 through T12 vertebra. There is degenerative disc disease at these levels.  Review of the MIP images confirms the above findings.   IMPRESSION: 1. There is no evidence for thoracic or abdominal aortic dissection. 2. No acute findings and no explanation for patient's nausea, vomiting and chest pain. 3. Atherosclerotic disease including multi vessel Coronary artery calcification. 4. Ground-glass attenuating nodule within the right lung measures 4 mm. This is favored to represent a benign process and no specific follow-up is recommended. Initial follow-up by chest CT without contrast is recommended in 3 months to confirm persistence. This recommendation follows the consensus statement: Recommendations for the Management of Subsolid Pulmonary Nodules Detected at CT: A Statement from the Fleischner Society as published in Radiology 2013; 266:304-317. 5. Several solid appearing nodules are noted within the periphery of the left upper lobe measuring up to 3 mm. If the patient is at high risk for bronchogenic carcinoma, follow-up chest CT at 1 year is recommended. If the patient is at low risk, no follow-up is needed. This recommendation follows the consensus statement: Guidelines for Management of Small Pulmonary Nodules Detected on CT Scans: A Statement from the Fleischner Society as published in Radiology 2005; 237:395-400. 6. Gallstone. 7. Large hiatal hernia.   Electronically Signed   By: Signa Kell M.D.   On: 03/21/2014 18:57   Ct Cta Abd/pel W/cm &/or W/o Cm  03/21/2014   CLINICAL DATA:  Centralized chest pain.  Nausea and vomiting.  EXAM: CT ANGIOGRAPHY CHEST, ABDOMEN AND PELVIS  TECHNIQUE: Multidetector CT imaging through the chest, abdomen and pelvis was performed using the standard protocol during bolus administration of intravenous contrast. Multiplanar reconstructed images and MIPs were obtained and reviewed to evaluate the vascular anatomy.  CONTRAST:  OMNIPAQUE IOHEXOL 350 MG/ML SOLN  COMPARISON:  03/22/2012  FINDINGS: CTA CHEST FINDINGS  Mediastinum: Normal heart size. No pericardial effusion. Calcified atherosclerotic  plaque involves the thoracic aorta as well as the LAD and RCA Coronary artery. There is no dissection. The main pulmonary artery appears patent. No proximal pulmonary artery embolus identified. There is a large hiatal hernia. The trachea appears patent and is midline.  Lungs/Pleura: There is no pleural effusion identified. No airspace consolidation identified. Ground-glass nodule in the right upper lobe measures 4 mm, image 21/series 6. Several tiny peripheral nodules in the left upper lobe are noted measuring up to 3 mm.  Musculoskeletal: The bones appear diffusely osteopenic. No acute abnormality identified.  Review of the MIP images confirms the above findings.  CTA ABDOMEN AND PELVIS FINDINGS  Hepatobiliary: No focal liver abnormality identified. 7 mm gallstone is identified. There is mild fusiform dilatation of the common bile duct which measures up to 8 mm. The pancreatic duct has a normal caliber.  Pancreas: Negative.  Spleen: Previous splenectomy.  Adrenals/Urinary Tract: Normal adrenal glands. Mild scarring involves the left kidney. There is a cyst in the right kidney measuring 1.6 cm. The urinary bladder appears normal.  Stomach/Bowel: Large hiatal hernia as above. The small bowel loops have a normal caliber without evidence for obstruction. Normal appearance of the colon no evidence for bowel obstruction.  Vascular/Lymphatic: Calcified atherosclerotic disease involves the abdominal aorta. No aneurysm or evidence of dissection. No enlarged retroperitoneal or mesenteric lymph nodes.  Reproductive: Previous hysterectomy.  No adnexal mass noted.  Other: There is no ascites or focal fluid collections within the abdomen or pelvis.  Musculoskeletal: Review of the visualized osseous structures is significant for diffuse osteopenia. There is a kyphosis deformity which is end around T10 through T12 vertebra. There is degenerative disc disease at these levels.  Review of the MIP images confirms the above findings.   IMPRESSION: 1. There is no evidence for thoracic or abdominal aortic dissection. 2. No acute findings and no explanation for patient's nausea, vomiting and chest pain. 3. Atherosclerotic disease including multi vessel Coronary artery calcification. 4. Ground-glass attenuating nodule within the right lung measures 4 mm. This is favored to represent a benign process and no specific follow-up is recommended. Initial follow-up by chest CT without contrast is recommended in 3 months to confirm persistence. This recommendation follows the consensus statement: Recommendations for the Management of Subsolid Pulmonary Nodules Detected at CT: A Statement from the Fleischner Society as published in Radiology 2013; 266:304-317. 5. Several solid appearing nodules are noted within the periphery of the left upper lobe measuring up to 3 mm. If the patient is at high risk for bronchogenic carcinoma, follow-up chest CT at 1 year is recommended. If the patient is at low risk, no follow-up is needed. This recommendation follows the consensus statement: Guidelines for Management of Small Pulmonary Nodules Detected on CT Scans: A Statement from the Fleischner Society as published in Radiology 2005; 237:395-400. 6. Gallstone. 7. Large hiatal hernia.   Electronically Signed   By: Signa Kell M.D.   On: 03/21/2014 18:57    EKG: Independently reviewed.  Assessment/Plan Principal Problem:   Chest pain Active Problems:   Essential hypertension   Atrial fibrillation   Chronic diastolic heart failure   Viral syndrome   Nausea   Diarrhea   Pulmonary nodule   1. Chest pain - most likely due to viral syndrome, CTA chest, abd, pelvis is negative for dissection. 1. Serial trops 2. Tele monitor 2. Viral syndrome 1. Zofran for nausea 2. Influenza swab pending but dosent sound much like flu 3. Hold lasix 4. Gentle hydration with IVF 5. Repeat labs in AM 6. Monitor for development of SIRS criteria 3. HTN - continue home  meds except diuretics and potassium 4. Pulmonary nodule - needs F/U CT in 3 months    Code Status: Full Code  Family Communication: No family in room Disposition Plan: Admit to obs   Time spent: 70 min  GARDNER, JARED M. [facility] Pager [phone]  If 7AM-7PM, please contact the day team taking care of the patient [email or web address] Password [facility] 03/21/2014, 8:43  PM        

## 2014-03-22 DIAGNOSIS — Z8673 Personal history of transient ischemic attack (TIA), and cerebral infarction without residual deficits: Secondary | ICD-10-CM

## 2014-03-22 DIAGNOSIS — I2510 Atherosclerotic heart disease of native coronary artery without angina pectoris: Secondary | ICD-10-CM | POA: Diagnosis not present

## 2014-03-22 DIAGNOSIS — I482 Chronic atrial fibrillation: Secondary | ICD-10-CM

## 2014-03-22 DIAGNOSIS — E669 Obesity, unspecified: Secondary | ICD-10-CM | POA: Diagnosis not present

## 2014-03-22 DIAGNOSIS — M810 Age-related osteoporosis without current pathological fracture: Secondary | ICD-10-CM | POA: Diagnosis not present

## 2014-03-22 DIAGNOSIS — I480 Paroxysmal atrial fibrillation: Secondary | ICD-10-CM | POA: Diagnosis not present

## 2014-03-22 DIAGNOSIS — R911 Solitary pulmonary nodule: Secondary | ICD-10-CM | POA: Diagnosis not present

## 2014-03-22 DIAGNOSIS — K219 Gastro-esophageal reflux disease without esophagitis: Secondary | ICD-10-CM | POA: Diagnosis not present

## 2014-03-22 DIAGNOSIS — I10 Essential (primary) hypertension: Secondary | ICD-10-CM | POA: Diagnosis not present

## 2014-03-22 DIAGNOSIS — E785 Hyperlipidemia, unspecified: Secondary | ICD-10-CM | POA: Diagnosis not present

## 2014-03-22 DIAGNOSIS — K449 Diaphragmatic hernia without obstruction or gangrene: Secondary | ICD-10-CM

## 2014-03-22 DIAGNOSIS — B349 Viral infection, unspecified: Secondary | ICD-10-CM | POA: Diagnosis not present

## 2014-03-22 DIAGNOSIS — R079 Chest pain, unspecified: Secondary | ICD-10-CM | POA: Diagnosis not present

## 2014-03-22 DIAGNOSIS — R110 Nausea: Secondary | ICD-10-CM | POA: Diagnosis not present

## 2014-03-22 DIAGNOSIS — Z7901 Long term (current) use of anticoagulants: Secondary | ICD-10-CM

## 2014-03-22 LAB — CBC
HEMATOCRIT: 40.5 % (ref 36.0–46.0)
HEMOGLOBIN: 13.2 g/dL (ref 12.0–15.0)
MCH: 30.1 pg (ref 26.0–34.0)
MCHC: 32.6 g/dL (ref 30.0–36.0)
MCV: 92.3 fL (ref 78.0–100.0)
Platelets: 277 10*3/uL (ref 150–400)
RBC: 4.39 MIL/uL (ref 3.87–5.11)
RDW: 15 % (ref 11.5–15.5)
WBC: 7.4 10*3/uL (ref 4.0–10.5)

## 2014-03-22 LAB — PROTIME-INR
INR: 1.77 — ABNORMAL HIGH (ref 0.00–1.49)
Prothrombin Time: 20.7 seconds — ABNORMAL HIGH (ref 11.6–15.2)

## 2014-03-22 LAB — BASIC METABOLIC PANEL
Anion gap: 5 (ref 5–15)
BUN: 12 mg/dL (ref 6–23)
CO2: 26 mmol/L (ref 19–32)
CREATININE: 0.87 mg/dL (ref 0.50–1.10)
Calcium: 8.5 mg/dL (ref 8.4–10.5)
Chloride: 105 mmol/L (ref 96–112)
GFR calc Af Amer: 67 mL/min — ABNORMAL LOW (ref >90)
GFR calc non Af Amer: 58 mL/min — ABNORMAL LOW (ref >90)
Glucose, Bld: 106 mg/dL — ABNORMAL HIGH (ref 70–99)
POTASSIUM: 3.8 mmol/L (ref 3.5–5.1)
SODIUM: 136 mmol/L (ref 135–145)

## 2014-03-22 LAB — INFLUENZA PANEL BY PCR (TYPE A & B)
H1N1FLUPCR: NOT DETECTED
INFLAPCR: NEGATIVE
INFLBPCR: NEGATIVE

## 2014-03-22 LAB — TROPONIN I
Troponin I: 0.03 ng/mL (ref <0.031)
Troponin I: <0.03 ng/mL (ref <0.031)

## 2014-03-22 MED ORDER — WARFARIN SODIUM 3 MG PO TABS: 3.0000 mg | ORAL_TABLET | Freq: Once | ORAL | Status: DC

## 2014-03-22 NOTE — Progress Notes (Signed)
ANTICOAGULATION CONSULT NOTE - Initial Consult  Pharmacy Consult for Coumadin Indication: atrial fibrillation  Allergies  Allergen Reactions  . Morphine And Related Other (See Comments)    hallucinations    Patient Measurements: TBW - 93 kg as of 07/2013  Vital Signs: Temp: 97.8 F (36.6 C) (02/20 0617) BP: 129/79 mmHg (02/20 0617) Pulse Rate: 67 (02/20 0617)  Labs:  Recent Labs  03/21/14 1630 03/21/14 2310 03/22/14 0403  HGB 14.3  --  13.2  HCT 43.4  --  40.5  PLT 316  --  277  LABPROT 20.6*  --  20.7*  INR 1.75*  --  1.77*  CREATININE 0.88  --  0.87  TROPONINI <0.03 <0.03 0.03    Estimated Creatinine Clearance: 46.9 mL/min (by C-G formula based on Cr of 0.87).   Medical History: Past Medical History  Diagnosis Date  . Paroxysmal atrial fibrillation     tachy-brady syndrome  . Hypertension   . Hyperlipidemia   . GERD (gastroesophageal reflux disease)   . Chest pain     with cath 2002 and 2004 showing mild non-obstructive CAD  . Osteoporosis   . Dyspnea     chronic; myoview 11/9. EF 81% probable soft-tissue attenuation in anterior and lat walls, can't exclude ischemia  . Obesity     Medications:  Prescriptions prior to admission  Medication Sig Dispense Refill Last Dose  . bisoprolol (ZEBETA) 5 MG tablet Take 2 tablets (10 mg total) by mouth 2 (two) times daily. 60 tablet 2 03/21/2014 at Unknown time  . calcium carbonate (TUMS) 500 MG chewable tablet Chew 1 tablet by mouth daily as needed for indigestion or heartburn.   03/21/2014 at Unknown time  . diltiazem (CARDIZEM CD) 120 MG 24 hr capsule Take 1 capsule (120 mg total) by mouth daily. 30 capsule 2 03/21/2014 at Unknown time  . furosemide (LASIX) 20 MG tablet Take 40 mg by mouth daily.   03/20/2014 at Unknown time  . isosorbide mononitrate (IMDUR) 30 MG 24 hr tablet TAKE 1 TABLET ONCE DAILY. 90 tablet 3 03/21/2014 at Unknown time  . omeprazole (PRILOSEC) 20 MG capsule Take 20 mg by mouth daily.      03/21/2014 at Unknown time  . pravastatin (PRAVACHOL) 40 MG tablet Take 1 tablet (40 mg total) by mouth daily. 30 tablet 2 03/21/2014 at Unknown time  . rOPINIRole (REQUIP) 0.5 MG tablet Take 0.5 mg by mouth at bedtime.   03/20/2014 at Unknown time  . warfarin (COUMADIN) 3 MG tablet Take 1.5-3 mg by mouth daily. On Tuesdays and Thursdays take half of the 3mg , Then whole tablet rest of days   03/20/2014 at Unknown time  . warfarin (COUMADIN) 2 MG tablet Recommend d/c regimen to be 4mg  MWF and 3mg  TTSS (Patient not taking: Reported on 03/21/2014) 100 tablet 2 Not Taking at Unknown time    Assessment: 81 yo F presents on 2/19 with chest pain differential includes viral syndrome. Pharmacy consulted to continue warfarin.  PMH includes Afib, HTN, HLD, GERD, and obesity.   Coag: Afib,  CT negative for dissection, Coumadin dose recently changed by MD. Pt has not taken dose today. INR on admission is subtherapeutic at 1.75, remains so. CBC stable. No s/s of bleed.  PTA Coumadin is 3mg  daily exc 1.5mg  on Tues/Thurs  Goal of Therapy:  INR 2-3 Monitor platelets by anticoagulation protocol: Yes   Plan:  Give coumadin 3mg  PO x 1 tonight Monitor daily INR, CBC, s/s of bleed  Thank you for  allowing pharmacy to be a part of this patients care team.  Lovenia Kim Pharm.D., BCPS, AQ-Cardiology Clinical Pharmacist 03/22/2014 10:54 AM Pager: [phone] Phone: [phone]

## 2014-03-22 NOTE — Discharge Instructions (Signed)
Cardiac Diet  A cardiac diet can help stop heart disease or a stroke from happening. It involves eating less unhealthy fats and eating more healthy fats.   FOODS TO AVOID OR LIMIT  · Limit saturated fats. This type of fat is found in oils and dairy products, such as:  ¨ Coconut oil.  ¨ Palm oil.  ¨ Cocoa butter.  ¨ Butter.  · Avoid trans-fat or hydrogenated oils. These are found in fried or pre-made baked goods, such as:  ¨ Margarine.  ¨ Pre-made cookies, cakes, and crackers.  · Limit processed meats (hot dogs, deli meats, sausage) to 3 ounces a week.  · Limit high-fat meats (marbled meats, fried chicken, or chicken with skin) to 3 ounces a week.  · Limit salt (sodium) to 1500 milligrams a day.  ·  Limit sweets and drinks with added sugar to no more than 5 servings a week. One serving is:  ¨ 1 tablespoon of sugar.  ¨ 1 tablespoon of jelly or jam.  ¨ ½ cup sorbet.  ¨ 1 cup lemonade.  ¨ ½ cup regular soda.  EAT MORE OF THE FOLLOWING FOODS  Fruit  · Eat 4 to 5 servings a day. One serving of fruit is:  ¨ 1 medium whole fruit.  ¨ ¼ cup dried fruit.  ¨ ½ cup of fresh, frozen, or canned fruit.  ¨ ½ cup 100% fruit juice.  Vegetables  · Eat 4 to 5 servings a day. One serving is:  ¨ 1 cup raw leafy vegetables.  ¨ ½ cup raw or cooked, cut-up vegetables.  ¨ ½ cup vegetable juice.  Whole Grains  · Eat 3 servings a day (1 ounce equals 1 serving).  Legumes (such as beans, peas, and lentils)   · Eat at least 4 servings a week (½ cup equals 1 serving).  Nuts and Seeds   · Eat at least 4 servings a week (¼ cup equals 1 serving).  Dietary Fiber  · Eat 20 to 30 grams a day. Some foods high in dietary fiber include:  ¨ Dried beans.  ¨ Citrus fruits.  ¨ Apples, bananas.  ¨ Broccoli, Brussels sprouts, and eggplant.  ¨ Oats.  Omega-3 Fats  · Eat food with omega-3 fats. You can also take a dietary pill (supplement) that has 1 gram of DHA and EPA. Have 3.5 ounces of fatty fish a week, such as:  ¨ Salmon.  ¨ Mackerel.  ¨ Albacore  tuna.  ¨ Sardines.  ¨ Lake trout.  ¨ Herring.  PREPARING YOUR FOOD  · Broil, bake, steam, or roast foods. Do not fry food. Do not cook food in butter (fat).  · Use non-stick cooking sprays.  · Remove skin from poultry, such as chicken and turkey.  · Remove fat from meat.  · Take the fat off the top of stews, soups, and gravy.  · Use lemon or herbs to flavor food instead of using butter or margarine.  · Use nonfat yogurt, salsa, or low-fat dressings for salads.  Document Released: 07/19/2011 Document Reviewed: 07/19/2011  Exit[employer]® Patient Information ©2015 [employer]. This information is not intended to replace advice given to you by your health care provider. Make sure you discuss any questions you have with your health care provider.

## 2014-03-22 NOTE — Progress Notes (Signed)
UR completed 

## 2014-03-22 NOTE — Discharge Summary (Signed)
Discharge Summary  [name] NLG:[id number] DOB: Oct 01, 1923  PCP: Charlott Rakes, MD  Admit date: 03/21/2014 Discharge date: 03/22/2014  Time spent: less than  Recommendations for Outpatient Follow-up:  1. F/u with pcp in a week, Pulmonary nodule - needs F/U CT in 3 months   Discharge Diagnoses:  Active Hospital Problems   Diagnosis Date Noted  . Chest pain 03/21/2014  . Viral syndrome 03/21/2014  . Nausea 03/21/2014  . Diarrhea 03/21/2014  . Pulmonary nodule 03/21/2014  . Chronic diastolic heart failure 10/18/2011  . Essential hypertension 06/04/2008  . Atrial fibrillation 06/04/2008    Resolved Hospital Problems   Diagnosis Date Noted Date Resolved  No resolved problems to display.    Discharge Condition: stable  Diet recommendation: heart healthy  Filed Weights   03/21/14 2141  Weight: 91.082 kg (200 lb 12.8 oz)    History of present illness:  BRI [name] is a 81 y.o. female who presents to the ED with upper back pain and substernal chest pain. Symptoms onset earlier today, associated with nausea, diarrhea, runny nose. Patient has a sick contact Engineer, building services) who visited her yesterday with similar symptoms of N/V/D. Nothing makes symptoms better or worse. No actual vomiting yet, just nausea. She reported has poor appetite since Thursday.  Hospital Course:  Chest pain - most likely atypical, CTA chest, abd, pelvis no acute finding, patient has known history of very large hiatal hernia. 1. Negative Serial trops 2. Tele monitor chronic afib, rate controlled Viral syndrome 3. Zofran for nausea 4. Influenza swab negative 5. Hold lasix 6. Gentle hydration with IVF 7.  labs in AM unremarkable 8. improved HTN - continue home meds, diuretics and potassium on hold in the hospital, resumed at time of discharge Pulmonary nodule - needs F/U CT in 3 months Large hiatal hernia: chronic, discussed minimal invasive surgery for correction of hiatal hernia.  patient currently not interested in aggressive intervention. She will discuss with pmd. H/o Afib, tachy-brady syndrome, s/p pacemaker, on chronic anticoagulation. H/o CVA with left lower extremity weakness, patient does not compliant with walker or cane, no falls. Fall precaution education provided.   Procedures:  none  Consultations:  none  Discharge Exam: BP 129/79 mmHg  Pulse 67  Temp(Src) 97.8 F (36.6 C) (Oral)  Resp 20  Ht 5\' 2"  (1.575 m)  Wt 91.082 kg (200 lb 12.8 oz)  BMI 36.72 kg/m2  SpO2 92%  General Appearance:   Alert, oriented, no distress, appears stated age  Head:   Normocephalic, atraumatic  Eyes:   PERRL, EOMI, sclera non-icteric      Nose:  Nares without drainage or epistaxis. Mucosa, turbinates normal  Throat:  Moist mucous membranes. Oropharynx without erythema or exudate.  Neck:  Supple. No carotid bruits. No thyromegaly. No lymphadenopathy.   Back:   No CVA tenderness, no spinal tenderness  Lungs:   Clear to auscultation bilaterally, without wheezes, rhonchi or rales  Chest wall:   No tenderness to palpitation  Heart:   Regular rate and rhythm without murmurs, gallops, rubs  Abdomen:   Soft, non-tender, nondistended, normal bowel sounds, no organomegaly  Genitalia:   deferred  Rectal:   deferred  Extremities:  No clubbing, cyanosis or edema.  Pulses:  2+ and symmetric all extremities  Skin:  Skin color, texture, turgor normal, no rashes or lesions  Lymph nodes:  Cervical, supraclavicular, and axillary nodes normal  Neurologic:  chronic left lower extremity weakness from prior CVA in 2015, left lower extremity strength 3/5.  CNII-XII intact.              Discharge Instructions You were cared for by a hospitalist during your hospital stay. If you have any questions about your discharge medications or the care you received while you were in the hospital after you are discharged, you  can call the unit and asked to speak with the hospitalist on call if the hospitalist that took care of you is not available. Once you are discharged, your primary care physician will handle any further medical issues. Please note that NO REFILLS for any discharge medications will be authorized once you are discharged, as it is imperative that you return to your primary care physician (or establish a relationship with a primary care physician if you do not have one) for your aftercare needs so that they can reassess your need for medications and monitor your lab values.  Discharge Instructions    Diet - low sodium heart healthy    Complete by:  As directed      Increase activity slowly    Complete by:  As directed             Medication List    TAKE these medications        bisoprolol 5 MG tablet  Commonly known as:  ZEBETA  Take 2 tablets (10 mg total) by mouth 2 (two) times daily.     diltiazem 120 MG 24 hr capsule  Commonly known as:  CARDIZEM CD  Take 1 capsule (120 mg total) by mouth daily.     furosemide 20 MG tablet  Commonly known as:  LASIX  Take 40 mg by mouth daily.     isosorbide mononitrate 30 MG 24 hr tablet  Commonly known as:  IMDUR  TAKE 1 TABLET ONCE DAILY.     omeprazole 20 MG capsule  Commonly known as:  PRILOSEC  Take 20 mg by mouth daily.     pravastatin 40 MG tablet  Commonly known as:  PRAVACHOL  Take 1 tablet (40 mg total) by mouth daily.     rOPINIRole 0.5 MG tablet  Commonly known as:  REQUIP  Take 0.5 mg by mouth at bedtime.     TUMS 500 MG chewable tablet  Generic drug:  calcium carbonate  Chew 1 tablet by mouth daily as needed for indigestion or heartburn.     warfarin 3 MG tablet  Commonly known as:  COUMADIN  Take 1.5-3 mg by mouth daily. On Tuesdays and Thursdays take half of the 3mg , Then whole tablet rest of days       Allergies  Allergen Reactions  . Morphine And Related Other (See Comments)    hallucinations         Follow-up Information    Follow up with HODGES,FRANCISCO, MD In 1 week.   Specialty:  Family Medicine       The results of significant diagnostics from this hospitalization (including imaging, microbiology, ancillary and laboratory) are listed below for reference.    Significant Diagnostic Studies: Ct Angio Chest Aorta W/cm &/or Wo/cm  03/21/2014   CLINICAL DATA:  Centralized chest pain.  Nausea and vomiting.  EXAM: CT ANGIOGRAPHY CHEST, ABDOMEN AND PELVIS  TECHNIQUE: Multidetector CT imaging through the chest, abdomen and pelvis was performed using the standard protocol during bolus administration of intravenous contrast. Multiplanar reconstructed images and MIPs were obtained and reviewed to evaluate the vascular anatomy.  CONTRAST:  03/23/2014 OMNIPAQUE IOHEXOL 350 MG/ML SOLN  COMPARISON:  03/22/2012  FINDINGS: CTA CHEST FINDINGS  Mediastinum: Normal heart size. No pericardial effusion. Calcified atherosclerotic plaque involves the thoracic aorta as well as the LAD and RCA Coronary artery. There is no dissection. The main pulmonary artery appears patent. No proximal pulmonary artery embolus identified. There is a large hiatal hernia. The trachea appears patent and is midline.  Lungs/Pleura: There is no pleural effusion identified. No airspace consolidation identified. Ground-glass nodule in the right upper lobe measures 4 mm, image 21/series 6. Several tiny peripheral nodules in the left upper lobe are noted measuring up to 3 mm.  Musculoskeletal: The bones appear diffusely osteopenic. No acute abnormality identified.  Review of the MIP images confirms the above findings.  CTA ABDOMEN AND PELVIS FINDINGS  Hepatobiliary: No focal liver abnormality identified. 7 mm gallstone is identified. There is mild fusiform dilatation of the common bile duct which measures up to 8 mm. The pancreatic duct has a normal caliber.  Pancreas: Negative.  Spleen: Previous splenectomy.  Adrenals/Urinary Tract: Normal adrenal  glands. Mild scarring involves the left kidney. There is a cyst in the right kidney measuring 1.6 cm. The urinary bladder appears normal.  Stomach/Bowel: Large hiatal hernia as above. The small bowel loops have a normal caliber without evidence for obstruction. Normal appearance of the colon no evidence for bowel obstruction.  Vascular/Lymphatic: Calcified atherosclerotic disease involves the abdominal aorta. No aneurysm or evidence of dissection. No enlarged retroperitoneal or mesenteric lymph nodes.  Reproductive: Previous hysterectomy.  No adnexal mass noted.  Other: There is no ascites or focal fluid collections within the abdomen or pelvis.  Musculoskeletal: Review of the visualized osseous structures is significant for diffuse osteopenia. There is a kyphosis deformity which is end around T10 through T12 vertebra. There is degenerative disc disease at these levels.  Review of the MIP images confirms the above findings.  IMPRESSION: 1. There is no evidence for thoracic or abdominal aortic dissection. 2. No acute findings and no explanation for patient's nausea, vomiting and chest pain. 3. Atherosclerotic disease including multi vessel Coronary artery calcification. 4. Ground-glass attenuating nodule within the right lung measures 4 mm. This is favored to represent a benign process and no specific follow-up is recommended. Initial follow-up by chest CT without contrast is recommended in 3 months to confirm persistence. This recommendation follows the consensus statement: Recommendations for the Management of Subsolid Pulmonary Nodules Detected at CT: A Statement from the Fleischner Society as published in Radiology 2013; 266:304-317. 5. Several solid appearing nodules are noted within the periphery of the left upper lobe measuring up to 3 mm. If the patient is at high risk for bronchogenic carcinoma, follow-up chest CT at 1 year is recommended. If the patient is at low risk, no follow-up is needed. This  recommendation follows the consensus statement: Guidelines for Management of Small Pulmonary Nodules Detected on CT Scans: A Statement from the Fleischner Society as published in Radiology 2005; 237:395-400. 6. Gallstone. 7. Large hiatal hernia.   Electronically Signed   By: Signa Kell M.D.   On: 03/21/2014 18:57   Ct Cta Abd/pel W/cm &/or W/o Cm  03/21/2014   CLINICAL DATA:  Centralized chest pain.  Nausea and vomiting.  EXAM: CT ANGIOGRAPHY CHEST, ABDOMEN AND PELVIS  TECHNIQUE: Multidetector CT imaging through the chest, abdomen and pelvis was performed using the standard protocol during bolus administration of intravenous contrast. Multiplanar reconstructed images and MIPs were obtained and reviewed to evaluate the vascular anatomy.  CONTRAST:  OMNIPAQUE IOHEXOL 350 MG/ML SOLN  COMPARISON:  03/22/2012  FINDINGS: CTA CHEST FINDINGS  Mediastinum: Normal heart size. No pericardial effusion. Calcified atherosclerotic plaque involves the thoracic aorta as well as the LAD and RCA Coronary artery. There is no dissection. The main pulmonary artery appears patent. No proximal pulmonary artery embolus identified. There is a large hiatal hernia. The trachea appears patent and is midline.  Lungs/Pleura: There is no pleural effusion identified. No airspace consolidation identified. Ground-glass nodule in the right upper lobe measures 4 mm, image 21/series 6. Several tiny peripheral nodules in the left upper lobe are noted measuring up to 3 mm.  Musculoskeletal: The bones appear diffusely osteopenic. No acute abnormality identified.  Review of the MIP images confirms the above findings.  CTA ABDOMEN AND PELVIS FINDINGS  Hepatobiliary: No focal liver abnormality identified. 7 mm gallstone is identified. There is mild fusiform dilatation of the common bile duct which measures up to 8 mm. The pancreatic duct has a normal caliber.  Pancreas: Negative.  Spleen: Previous splenectomy.  Adrenals/Urinary Tract: Normal  adrenal glands. Mild scarring involves the left kidney. There is a cyst in the right kidney measuring 1.6 cm. The urinary bladder appears normal.  Stomach/Bowel: Large hiatal hernia as above. The small bowel loops have a normal caliber without evidence for obstruction. Normal appearance of the colon no evidence for bowel obstruction.  Vascular/Lymphatic: Calcified atherosclerotic disease involves the abdominal aorta. No aneurysm or evidence of dissection. No enlarged retroperitoneal or mesenteric lymph nodes.  Reproductive: Previous hysterectomy.  No adnexal mass noted.  Other: There is no ascites or focal fluid collections within the abdomen or pelvis.  Musculoskeletal: Review of the visualized osseous structures is significant for diffuse osteopenia. There is a kyphosis deformity which is end around T10 through T12 vertebra. There is degenerative disc disease at these levels.  Review of the MIP images confirms the above findings.  IMPRESSION: 1. There is no evidence for thoracic or abdominal aortic dissection. 2. No acute findings and no explanation for patient's nausea, vomiting and chest pain. 3. Atherosclerotic disease including multi vessel Coronary artery calcification. 4. Ground-glass attenuating nodule within the right lung measures 4 mm. This is favored to represent a benign process and no specific follow-up is recommended. Initial follow-up by chest CT without contrast is recommended in 3 months to confirm persistence. This recommendation follows the consensus statement: Recommendations for the Management of Subsolid Pulmonary Nodules Detected at CT: A Statement from the Fleischner Society as published in Radiology 2013; 266:304-317. 5. Several solid appearing nodules are noted within the periphery of the left upper lobe measuring up to 3 mm. If the patient is at high risk for bronchogenic carcinoma, follow-up chest CT at 1 year is recommended. If the patient is at low risk, no follow-up is needed. This  recommendation follows the consensus statement: Guidelines for Management of Small Pulmonary Nodules Detected on CT Scans: A Statement from the Fleischner Society as published in Radiology 2005; 237:395-400. 6. Gallstone. 7. Large hiatal hernia.   Electronically Signed   By: Signa Kell M.D.   On: 03/21/2014 18:57    Microbiology: No results found for this or any previous visit (from the past 240 hour(s)).   Labs: Basic Metabolic Panel:  Recent Labs Lab 03/21/14 1630 03/22/14 0403  NA 137 136  K 4.3 3.8  CL 105 105  CO2 23 26  GLUCOSE 116* 106*  BUN 13 12  CREATININE 0.88 0.87  CALCIUM 8.7 8.5   Liver Function Tests:  Recent Labs Lab 03/21/14 1630  AST  25  ALT 11  ALKPHOS 129*  BILITOT 1.2  PROT 6.8  ALBUMIN 3.4*    Recent Labs Lab 03/21/14 1630  LIPASE 26   No results for input(s): AMMONIA in the last 168 hours. CBC:  Recent Labs Lab 03/21/14 1630 03/22/14 0403  WBC 12.0* 7.4  NEUTROABS 9.6*  --   HGB 14.3 13.2  HCT 43.4 40.5  MCV 92.3 92.3  PLT 316 277   Cardiac Enzymes:  Recent Labs Lab 03/21/14 1630 03/21/14 2310 03/22/14 0403 03/22/14 0930  TROPONINI <0.03 <0.03 0.03 <0.03   BNP: BNP (last 3 results) No results for input(s): BNP in the last 8760 hours.  ProBNP (last 3 results) No results for input(s): PROBNP in the last 8760 hours.  CBG: No results for input(s): GLUCAP in the last 168 hours.     Signed:  Gelisa Tieken  [facility] 03/22/2014, 12:48 PM

## 2014-03-23 LAB — URINE CULTURE: Colony Count: 50000

## 2014-03-26 ENCOUNTER — Encounter: Payer: Self-pay | Admitting: *Deleted

## 2014-04-01 DIAGNOSIS — I4891 Unspecified atrial fibrillation: Secondary | ICD-10-CM | POA: Diagnosis not present

## 2014-04-09 ENCOUNTER — Telehealth: Payer: Self-pay | Admitting: Internal Medicine

## 2014-04-09 NOTE — Telephone Encounter (Signed)
New Message  Pt wanted to speak w/ someone about remote device. Please call back and discuss.

## 2014-04-09 NOTE — Telephone Encounter (Signed)
Instructed pt that if she sent transmission to keep her May 3 appt w/ SK. Pt verbalized understanding.

## 2014-04-10 ENCOUNTER — Ambulatory Visit (HOSPITAL_COMMUNITY)
Admission: RE | Admit: 2014-04-10 | Discharge: 2014-04-10 | Disposition: A | Discharge status: Home / Self Care | Payer: Medicare HMO | Source: Ambulatory Visit | Attending: Internal Medicine | Admitting: Internal Medicine

## 2014-04-10 ENCOUNTER — Encounter (HOSPITAL_COMMUNITY): Payer: Self-pay

## 2014-04-10 VITALS — BP 130/70 | HR 80 | Wt 196.8 lb

## 2014-04-10 DIAGNOSIS — Z950 Presence of cardiac pacemaker: Secondary | ICD-10-CM | POA: Insufficient documentation

## 2014-04-10 DIAGNOSIS — I482 Chronic atrial fibrillation: Secondary | ICD-10-CM | POA: Diagnosis not present

## 2014-04-10 DIAGNOSIS — I5032 Chronic diastolic (congestive) heart failure: Secondary | ICD-10-CM | POA: Diagnosis not present

## 2014-04-10 DIAGNOSIS — M810 Age-related osteoporosis without current pathological fracture: Secondary | ICD-10-CM | POA: Diagnosis not present

## 2014-04-10 DIAGNOSIS — Z79899 Other long term (current) drug therapy: Secondary | ICD-10-CM | POA: Insufficient documentation

## 2014-04-10 DIAGNOSIS — K219 Gastro-esophageal reflux disease without esophagitis: Secondary | ICD-10-CM | POA: Diagnosis not present

## 2014-04-10 DIAGNOSIS — I480 Paroxysmal atrial fibrillation: Secondary | ICD-10-CM | POA: Diagnosis not present

## 2014-04-10 DIAGNOSIS — Z8673 Personal history of transient ischemic attack (TIA), and cerebral infarction without residual deficits: Secondary | ICD-10-CM | POA: Insufficient documentation

## 2014-04-10 DIAGNOSIS — E785 Hyperlipidemia, unspecified: Secondary | ICD-10-CM | POA: Insufficient documentation

## 2014-04-10 DIAGNOSIS — R0600 Dyspnea, unspecified: Secondary | ICD-10-CM | POA: Diagnosis not present

## 2014-04-10 DIAGNOSIS — I10 Essential (primary) hypertension: Secondary | ICD-10-CM | POA: Insufficient documentation

## 2014-04-10 DIAGNOSIS — R0602 Shortness of breath: Secondary | ICD-10-CM

## 2014-04-10 DIAGNOSIS — Z7901 Long term (current) use of anticoagulants: Secondary | ICD-10-CM | POA: Insufficient documentation

## 2014-04-10 DIAGNOSIS — I495 Sick sinus syndrome: Secondary | ICD-10-CM | POA: Diagnosis not present

## 2014-04-10 DIAGNOSIS — R42 Dizziness and giddiness: Secondary | ICD-10-CM | POA: Diagnosis not present

## 2014-04-10 NOTE — Progress Notes (Signed)
Patient ID: [name], female   DOB: [birth year], 81 y.o.   MRN: [id number]  HPI: [name] is a delightful 81 year old woman with a history of syncope in the setting of atrial fibrillation c/b tachybrady syndrome s/p pacemaker placement in 2010, chronic dyspnea as well as hypertension and gastroesophageal reflux disease.  2011 Decreased bisprolol due to resting breadycardia and ordered a Myoview to further evaluate her chronic dyspnea. Myoview low risk. Had event monitor last year with PACs but no significant arrhythmias or pauses.   09/2010  LHC = Aortic pressure is 132/74 with a mean of 100 mmHg, left ventricular pressure is 139 with an EDP of 16 mmHg. L [place] normal LAD 0% narrowing in the proximal and midvessel. The first diagonal branch has a 40% ostial stenosis. The left circumflex coronary artery has mild irregularities up to 10- 20%. The right coronary artery is a dominant vessel. It has mild wall irregularities less than or equal to 10%.   Follow up:  Ongoing dyspnea with exertion. Not able to weigh at home due to balance problems. Says she skips every not and then.  Having dizziness. Legs swell through out the day.   Labs 09/26/12: K 3.5, creatinine 0.84, proBNP 1265          10/09/12: K 3.5, Cr 0.83, AST 21, ALT 11  ECHO 07/2009: EF 55-60%, mild MR, LA mod dilated, RA mild dilated ECHO 10/14: EF 55-60%, biatrial enlargement, normal RV size/systolic function, PA systolic pressure 39 mmHg.   SH: Lives in [place] with her husband, nonsmoker.   ROS: All systems negative except as listed in HPI, PMH and Problem List.  Past Medical History  Diagnosis Date  . Paroxysmal atrial fibrillation     tachy-brady syndrome  . Hypertension   . Hyperlipidemia   . GERD (gastroesophageal reflux disease)   . Chest pain     with cath 2002 and 2004 showing mild non-obstructive CAD  . Osteoporosis   . Dyspnea     chronic; myoview 11/9. EF 81% probable soft-tissue attenuation in anterior and lat  walls, can't exclude ischemia  . Obesity    Current Outpatient Prescriptions  Medication Sig Dispense Refill  . bisoprolol (ZEBETA) 5 MG tablet Take 2 tablets (10 mg total) by mouth 2 (two) times daily. 60 tablet 2  . calcium carbonate (TUMS) 500 MG chewable tablet Chew 1 tablet by mouth daily as needed for indigestion or heartburn.    . diltiazem (CARDIZEM CD) 120 MG 24 hr capsule Take 1 capsule (120 mg total) by mouth daily. 30 capsule 2  . furosemide (LASIX) 20 MG tablet Take 40 mg by mouth daily.    . isosorbide mononitrate (IMDUR) 30 MG 24 hr tablet TAKE 1 TABLET ONCE DAILY. 90 tablet 3  . omeprazole (PRILOSEC) 20 MG capsule Take 20 mg by mouth daily.      . pravastatin (PRAVACHOL) 40 MG tablet Take 1 tablet (40 mg total) by mouth daily. 30 tablet 2  . rOPINIRole (REQUIP) 0.5 MG tablet Take 0.5 mg by mouth at bedtime.    [name] warfarin (COUMADIN) 3 MG tablet Take 1.5-3 mg by mouth daily. On Tuesdays and Thursdays take half of the 3mg , Then whole tablet rest of days     No current facility-administered medications for this encounter.   Filed Vitals:   04/10/14 1428  BP: 130/70  Pulse: 80  Weight: 196 lb 12 oz (89.245 kg)  SpO2: 96%  Last weight: 200 lbs  PHYSICAL EXAM: General:  Elderly, chronically ill appearing. +diffuse  tremor. No acute distress. With grandaughter HEENT: normal;  Neck: supple. JVD 6-7 . Carotids 2+ bilat; no bruits. No lymphadenopathy or thryomegaly appreciated. Cor: PMI nondisplaced. Irregular rate & rhythm. No rubs, gallops, murmur. Back + scoliosis/kyphosis. Lungs: clear Abdomen: soft, nontender,  Mildly distended.  Good bowel sounds. Extremities: no cyanosis, clubbing, rash, 1-2+ ankle edema R>L Neuro: alert & orientedx3, cranial nerves grossly intact. moves all 4 extremities w/o difficulty  ASSESSMENT & PLAN:  1) Chronic Diastolic HF, EF 55-60% - Volume status stable. Continue lasix 40 mg daily.   Reinforced need for daily weights and reviewed use of  sliding scale diuretics. 2) Chronic A-fib-  Rate controlled. Will continue diltiazem and coumadin.   3) Dyspnea 4) CVA S/P 2015. -on coumadin, and statin  5) Vertigo - Refer to outpatient PT.   Follow up in 6 months.  CLEGG,AMY 04/10/2014  Patient seen and examined with Tonye Becket, NP. We discussed all aspects of the encounter. I agree with the assessment and plan as stated above.   Doing very well. Overall quite stable. Continues with exertional dyspnea. She says this was much better when she was exercising in cardiac rehab. Urged her to join the [facility] with her family as they have encouraged her. Also suggested getting an exercise bike at home which she did well on. Volume status looks fine. Will refer to PT for stone repositioning for vertigo. Continue coumadin for AF.   Daniel Bensimhon,MD 1:34 AM

## 2014-04-10 NOTE — Patient Instructions (Addendum)
Will refer you to Outpatient Physical Therapy for Vertigo. Address: [address] [place], [place], [place] [address]  [phone]   Your physician recommends that you schedule a follow-up appointment in:  1 year  Do the following things EVERYDAY: 1) Weigh yourself in the morning before breakfast. Write it down and keep it in a log. 2) Take your medicines as prescribed 3) Eat low salt foods-Limit salt (sodium) to 2000 mg per day.  4) Stay as active as you can everyday 5) Limit all fluids for the day to less than 2 liters 6)

## 2014-04-11 DIAGNOSIS — R42 Dizziness and giddiness: Secondary | ICD-10-CM | POA: Insufficient documentation

## 2014-05-06 DIAGNOSIS — I4891 Unspecified atrial fibrillation: Secondary | ICD-10-CM | POA: Diagnosis not present

## 2014-05-09 DIAGNOSIS — I4891 Unspecified atrial fibrillation: Secondary | ICD-10-CM | POA: Diagnosis not present

## 2014-05-13 ENCOUNTER — Other Ambulatory Visit (HOSPITAL_COMMUNITY): Payer: Self-pay | Admitting: Internal Medicine

## 2014-05-13 DIAGNOSIS — I4891 Unspecified atrial fibrillation: Secondary | ICD-10-CM | POA: Diagnosis not present

## 2014-05-27 DIAGNOSIS — I4891 Unspecified atrial fibrillation: Secondary | ICD-10-CM | POA: Diagnosis not present

## 2014-06-03 ENCOUNTER — Encounter: Payer: Self-pay | Admitting: Internal Medicine

## 2014-06-03 ENCOUNTER — Ambulatory Visit (INDEPENDENT_AMBULATORY_CARE_PROVIDER_SITE_OTHER): Payer: Commercial Managed Care - HMO | Admitting: Internal Medicine

## 2014-06-03 VITALS — BP 120/82 | HR 61 | Ht 60.0 in | Wt 196.5 lb

## 2014-06-03 DIAGNOSIS — I480 Paroxysmal atrial fibrillation: Secondary | ICD-10-CM | POA: Diagnosis not present

## 2014-06-03 DIAGNOSIS — Z950 Presence of cardiac pacemaker: Secondary | ICD-10-CM

## 2014-06-03 DIAGNOSIS — I495 Sick sinus syndrome: Secondary | ICD-10-CM | POA: Diagnosis not present

## 2014-06-03 LAB — CUP PACEART INCLINIC DEVICE CHECK
Battery Remaining Longevity: 130.8 mo
Battery Voltage: 2.95 V
Date Time Interrogation Session: 20160503115638
Lead Channel Pacing Threshold Amplitude: 0.75 V
Lead Channel Pacing Threshold Amplitude: 0.875 V
Lead Channel Pacing Threshold Pulse Width: 0.4 ms
Lead Channel Pacing Threshold Pulse Width: 0.4 ms
Lead Channel Pacing Threshold Pulse Width: 0.4 ms
Lead Channel Setting Pacing Amplitude: 2 V
Lead Channel Setting Pacing Pulse Width: 0.4 ms
Lead Channel Setting Sensing Sensitivity: 2 mV
MDC IDC MSMT LEADCHNL RA SENSING INTR AMPL: 1.7 mV
MDC IDC MSMT LEADCHNL RV IMPEDANCE VALUE: 662.5 Ohm
MDC IDC MSMT LEADCHNL RV PACING THRESHOLD AMPLITUDE: 0.75 V
MDC IDC MSMT LEADCHNL RV SENSING INTR AMPL: 12 mV
MDC IDC STAT BRADY RA PERCENT PACED: 0 %
MDC IDC STAT BRADY RV PERCENT PACED: 21 %
Pulse Gen Serial Number: 2285053

## 2014-06-03 NOTE — Progress Notes (Signed)
Patient Care Team: Charlott Rakes, MD as PCP - General (Family Medicine) Charlott Rakes, MD as Referring Physician (Family Medicine)   HPI  [name] is a 81 y.o. female Seen in followup for pacer implanted for tachybrady and now with permanent AF  She continues with some dyspnea on exertion although this is relatively stable. She's had no significant edema of late  Her husband died a year ago. Her son-in-law died a few months ago. Her daughter is struggling immensely.  she is [name] grandmother.    She has been followed by CHF clinic. DOE rx with increasing diuretics  Echo 10/14 EF>>55-60% Past Medical History  Diagnosis Date  . Paroxysmal atrial fibrillation     tachy-brady syndrome  . Hypertension   . Hyperlipidemia   . GERD (gastroesophageal reflux disease)   . Chest pain     with cath 2002 and 2004 showing mild non-obstructive CAD  . Osteoporosis   . Dyspnea     chronic; myoview 11/9. EF 81% probable soft-tissue attenuation in anterior and lat walls, can't exclude ischemia  . Obesity   . Stroke     Past Surgical History  Procedure Laterality Date  . Cardiac catheterization    . Breast lumpectomy      right  . Total abdominal hysterectomy w/ bilateral salpingoophorectomy  1974  . Colectomy  1991  . Pacemaker insertion      St Jude  . Splenectomy      Current Outpatient Prescriptions  Medication Sig Dispense Refill  . bisoprolol (ZEBETA) 5 MG tablet Take 2 tablets (10 mg total) by mouth 2 (two) times daily. 60 tablet 2  . calcium carbonate (TUMS) 500 MG chewable tablet Chew 1 tablet by mouth daily as needed for indigestion or heartburn.    . diltiazem (CARDIZEM CD) 240 MG 24 hr capsule Take 240 mg by mouth daily.     . furosemide (LASIX) 20 MG tablet Take 20 mg by mouth as needed.     . isosorbide mononitrate (IMDUR) 30 MG 24 hr tablet TAKE 1 TABLET ONCE DAILY. 90 tablet 3  . meclizine (ANTIVERT) 25 MG tablet as needed.    [name]  omeprazole (PRILOSEC) 20 MG capsule Take 20 mg by mouth daily.      . pravastatin (PRAVACHOL) 40 MG tablet Take 1 tablet (40 mg total) by mouth daily. 30 tablet 2  . rOPINIRole (REQUIP) 0.5 MG tablet Take 0.5 mg by mouth at bedtime.    [name] warfarin (COUMADIN) 1 MG tablet Take 1 mg by mouth as directed.     . warfarin (COUMADIN) 3 MG tablet Take 1.5-3 mg by mouth daily. On Tuesdays and Thursdays take half of the 3mg , Then whole tablet rest of days     No current facility-administered medications for this visit.    Allergies  Allergen Reactions  . Morphine And Related Other (See Comments)    hallucinations    Review of Systems negative except from HPI and PMH  Physical Exam BP 120/82 mmHg  Pulse 61  Ht 5' (1.524 m)  Wt 196 lb 8 oz (89.132 kg)  BMI 38.38 kg/m2 Well developed and well nourished in no acute distress HENT normal E scleral and icterus clear Neck Supple JVP 7-8 ; carotids brisk and full Clear to ausculation Kyphosis Device pocket well healed; without hematoma or erythema.  There is no tethering Regular rate and rhythm, no murmurs gallops or rub Soft with active bowel sounds No clubbing cyanosis  tr Edema Alert and oriented, grossly normal motor and sensory function Skin Warm and Dry  ECG demonstrates atrial fibrillation with intermittent ventricular pacing  Assessment and  Plan  HFpEF Euvolemic continue current meds   Atrial fib-permanent  Continue warfarin  Hypertension  Well controlled  Pacemaker St Jude The patient's device was interrogated.  The information was reviewed. No changes were made in the programming.

## 2014-06-03 NOTE — Patient Instructions (Signed)
Medication Instructions: - no changes  Labwork: - none  Procedures/Testing: - none  Follow-Up: - Your physician wants you to follow-up in: 1 year with Dr. Klein. You will receive a reminder letter in the mail two months in advance. If you don't receive a letter, please call our office to schedule the follow-up appointment.  Any Additional Special Instructions Will Be Listed Below (If Applicable). - none  

## 2014-06-04 DIAGNOSIS — I4891 Unspecified atrial fibrillation: Secondary | ICD-10-CM | POA: Diagnosis not present

## 2014-06-28 DIAGNOSIS — J40 Bronchitis, not specified as acute or chronic: Secondary | ICD-10-CM | POA: Diagnosis not present

## 2014-06-28 DIAGNOSIS — R0602 Shortness of breath: Secondary | ICD-10-CM | POA: Diagnosis not present

## 2014-06-28 DIAGNOSIS — R079 Chest pain, unspecified: Secondary | ICD-10-CM | POA: Diagnosis not present

## 2014-06-28 DIAGNOSIS — R05 Cough: Secondary | ICD-10-CM | POA: Diagnosis not present

## 2014-06-28 DIAGNOSIS — J209 Acute bronchitis, unspecified: Secondary | ICD-10-CM | POA: Diagnosis not present

## 2014-07-06 DIAGNOSIS — M1712 Unilateral primary osteoarthritis, left knee: Secondary | ICD-10-CM | POA: Diagnosis not present

## 2014-07-06 DIAGNOSIS — R52 Pain, unspecified: Secondary | ICD-10-CM | POA: Diagnosis not present

## 2014-07-06 DIAGNOSIS — M7122 Synovial cyst of popliteal space [Baker], left knee: Secondary | ICD-10-CM | POA: Diagnosis not present

## 2014-07-06 DIAGNOSIS — M25511 Pain in right shoulder: Secondary | ICD-10-CM | POA: Diagnosis not present

## 2014-07-06 DIAGNOSIS — M179 Osteoarthritis of knee, unspecified: Secondary | ICD-10-CM | POA: Diagnosis not present

## 2014-08-07 ENCOUNTER — Encounter: Payer: Self-pay | Admitting: Internal Medicine

## 2014-08-08 ENCOUNTER — Encounter: Payer: Self-pay | Admitting: Internal Medicine

## 2014-08-20 DIAGNOSIS — I4891 Unspecified atrial fibrillation: Secondary | ICD-10-CM | POA: Diagnosis not present

## 2014-08-26 DIAGNOSIS — I129 Hypertensive chronic kidney disease with stage 1 through stage 4 chronic kidney disease, or unspecified chronic kidney disease: Secondary | ICD-10-CM | POA: Diagnosis not present

## 2014-08-26 DIAGNOSIS — N182 Chronic kidney disease, stage 2 (mild): Secondary | ICD-10-CM | POA: Diagnosis not present

## 2014-08-26 DIAGNOSIS — R7301 Impaired fasting glucose: Secondary | ICD-10-CM | POA: Diagnosis not present

## 2014-08-26 DIAGNOSIS — I4891 Unspecified atrial fibrillation: Secondary | ICD-10-CM | POA: Diagnosis not present

## 2014-09-24 DIAGNOSIS — I4891 Unspecified atrial fibrillation: Secondary | ICD-10-CM | POA: Diagnosis not present

## 2014-10-15 DIAGNOSIS — G4700 Insomnia, unspecified: Secondary | ICD-10-CM | POA: Diagnosis not present

## 2014-10-15 DIAGNOSIS — J189 Pneumonia, unspecified organism: Secondary | ICD-10-CM | POA: Diagnosis not present

## 2014-10-15 DIAGNOSIS — R634 Abnormal weight loss: Secondary | ICD-10-CM | POA: Diagnosis not present

## 2014-10-15 DIAGNOSIS — J449 Chronic obstructive pulmonary disease, unspecified: Secondary | ICD-10-CM | POA: Diagnosis not present

## 2014-10-27 DIAGNOSIS — I4891 Unspecified atrial fibrillation: Secondary | ICD-10-CM | POA: Diagnosis not present

## 2014-11-02 DIAGNOSIS — I4891 Unspecified atrial fibrillation: Secondary | ICD-10-CM | POA: Diagnosis not present

## 2014-11-02 DIAGNOSIS — N3001 Acute cystitis with hematuria: Secondary | ICD-10-CM | POA: Diagnosis not present

## 2014-11-02 DIAGNOSIS — K219 Gastro-esophageal reflux disease without esophagitis: Secondary | ICD-10-CM | POA: Diagnosis not present

## 2014-11-02 DIAGNOSIS — N390 Urinary tract infection, site not specified: Secondary | ICD-10-CM | POA: Diagnosis not present

## 2014-11-02 DIAGNOSIS — Z7901 Long term (current) use of anticoagulants: Secondary | ICD-10-CM | POA: Diagnosis not present

## 2014-11-02 DIAGNOSIS — I8002 Phlebitis and thrombophlebitis of superficial vessels of left lower extremity: Secondary | ICD-10-CM | POA: Diagnosis not present

## 2014-11-02 DIAGNOSIS — R002 Palpitations: Secondary | ICD-10-CM | POA: Diagnosis not present

## 2014-11-02 DIAGNOSIS — Z79899 Other long term (current) drug therapy: Secondary | ICD-10-CM | POA: Diagnosis not present

## 2014-11-02 DIAGNOSIS — I10 Essential (primary) hypertension: Secondary | ICD-10-CM | POA: Diagnosis not present

## 2014-11-02 DIAGNOSIS — Z8673 Personal history of transient ischemic attack (TIA), and cerebral infarction without residual deficits: Secondary | ICD-10-CM | POA: Diagnosis not present

## 2014-11-03 DIAGNOSIS — I10 Essential (primary) hypertension: Secondary | ICD-10-CM | POA: Diagnosis not present

## 2014-11-03 DIAGNOSIS — Z79899 Other long term (current) drug therapy: Secondary | ICD-10-CM | POA: Diagnosis not present

## 2014-11-03 DIAGNOSIS — I8002 Phlebitis and thrombophlebitis of superficial vessels of left lower extremity: Secondary | ICD-10-CM | POA: Diagnosis not present

## 2014-11-03 DIAGNOSIS — Z7901 Long term (current) use of anticoagulants: Secondary | ICD-10-CM | POA: Diagnosis not present

## 2014-11-03 DIAGNOSIS — I4891 Unspecified atrial fibrillation: Secondary | ICD-10-CM | POA: Diagnosis not present

## 2014-11-03 DIAGNOSIS — N3001 Acute cystitis with hematuria: Secondary | ICD-10-CM | POA: Diagnosis not present

## 2014-11-03 DIAGNOSIS — Z8673 Personal history of transient ischemic attack (TIA), and cerebral infarction without residual deficits: Secondary | ICD-10-CM | POA: Diagnosis not present

## 2014-11-03 DIAGNOSIS — K219 Gastro-esophageal reflux disease without esophagitis: Secondary | ICD-10-CM | POA: Diagnosis not present

## 2014-11-10 DIAGNOSIS — I803 Phlebitis and thrombophlebitis of lower extremities, unspecified: Secondary | ICD-10-CM | POA: Diagnosis not present

## 2014-11-10 DIAGNOSIS — Z23 Encounter for immunization: Secondary | ICD-10-CM | POA: Diagnosis not present

## 2014-11-10 DIAGNOSIS — I4891 Unspecified atrial fibrillation: Secondary | ICD-10-CM | POA: Diagnosis not present

## 2014-11-10 DIAGNOSIS — N390 Urinary tract infection, site not specified: Secondary | ICD-10-CM | POA: Diagnosis not present

## 2014-11-24 ENCOUNTER — Other Ambulatory Visit (HOSPITAL_COMMUNITY): Payer: Self-pay | Admitting: Internal Medicine

## 2014-11-27 DIAGNOSIS — Z9181 History of falling: Secondary | ICD-10-CM | POA: Diagnosis not present

## 2014-11-27 DIAGNOSIS — Z1389 Encounter for screening for other disorder: Secondary | ICD-10-CM | POA: Diagnosis not present

## 2014-11-27 DIAGNOSIS — Z0000 Encounter for general adult medical examination without abnormal findings: Secondary | ICD-10-CM | POA: Diagnosis not present

## 2014-11-27 DIAGNOSIS — Z139 Encounter for screening, unspecified: Secondary | ICD-10-CM | POA: Diagnosis not present

## 2014-12-01 DIAGNOSIS — R300 Dysuria: Secondary | ICD-10-CM | POA: Diagnosis not present

## 2014-12-01 DIAGNOSIS — H6120 Impacted cerumen, unspecified ear: Secondary | ICD-10-CM | POA: Diagnosis not present

## 2014-12-01 DIAGNOSIS — R319 Hematuria, unspecified: Secondary | ICD-10-CM | POA: Diagnosis not present

## 2014-12-01 DIAGNOSIS — R42 Dizziness and giddiness: Secondary | ICD-10-CM | POA: Diagnosis not present

## 2014-12-06 DIAGNOSIS — R319 Hematuria, unspecified: Secondary | ICD-10-CM | POA: Diagnosis not present

## 2014-12-06 DIAGNOSIS — K760 Fatty (change of) liver, not elsewhere classified: Secondary | ICD-10-CM | POA: Diagnosis not present

## 2014-12-10 DIAGNOSIS — Z6836 Body mass index (BMI) 36.0-36.9, adult: Secondary | ICD-10-CM | POA: Diagnosis not present

## 2014-12-10 DIAGNOSIS — I4891 Unspecified atrial fibrillation: Secondary | ICD-10-CM | POA: Diagnosis not present

## 2014-12-10 DIAGNOSIS — R300 Dysuria: Secondary | ICD-10-CM | POA: Diagnosis not present

## 2014-12-10 DIAGNOSIS — R319 Hematuria, unspecified: Secondary | ICD-10-CM | POA: Diagnosis not present

## 2014-12-18 DIAGNOSIS — I4891 Unspecified atrial fibrillation: Secondary | ICD-10-CM | POA: Diagnosis not present

## 2015-01-01 DIAGNOSIS — I4891 Unspecified atrial fibrillation: Secondary | ICD-10-CM | POA: Diagnosis not present

## 2015-01-07 DIAGNOSIS — I4891 Unspecified atrial fibrillation: Secondary | ICD-10-CM | POA: Diagnosis not present

## 2015-01-08 DIAGNOSIS — Z6836 Body mass index (BMI) 36.0-36.9, adult: Secondary | ICD-10-CM | POA: Diagnosis not present

## 2015-01-08 DIAGNOSIS — R05 Cough: Secondary | ICD-10-CM | POA: Diagnosis not present

## 2015-01-08 DIAGNOSIS — J029 Acute pharyngitis, unspecified: Secondary | ICD-10-CM | POA: Diagnosis not present

## 2015-01-15 DIAGNOSIS — E785 Hyperlipidemia, unspecified: Secondary | ICD-10-CM | POA: Diagnosis not present

## 2015-01-27 DIAGNOSIS — Z720 Tobacco use: Secondary | ICD-10-CM | POA: Diagnosis not present

## 2015-01-27 DIAGNOSIS — Z1211 Encounter for screening for malignant neoplasm of colon: Secondary | ICD-10-CM | POA: Diagnosis not present

## 2015-01-27 DIAGNOSIS — G4700 Insomnia, unspecified: Secondary | ICD-10-CM | POA: Diagnosis not present

## 2015-01-27 DIAGNOSIS — E785 Hyperlipidemia, unspecified: Secondary | ICD-10-CM | POA: Diagnosis not present

## 2015-01-28 DIAGNOSIS — N3090 Cystitis, unspecified without hematuria: Secondary | ICD-10-CM | POA: Diagnosis not present

## 2015-01-28 DIAGNOSIS — R3129 Other microscopic hematuria: Secondary | ICD-10-CM | POA: Diagnosis not present

## 2015-02-12 DIAGNOSIS — I4891 Unspecified atrial fibrillation: Secondary | ICD-10-CM | POA: Diagnosis not present

## 2015-02-17 DIAGNOSIS — I4891 Unspecified atrial fibrillation: Secondary | ICD-10-CM | POA: Diagnosis not present

## 2015-02-24 DIAGNOSIS — I129 Hypertensive chronic kidney disease with stage 1 through stage 4 chronic kidney disease, or unspecified chronic kidney disease: Secondary | ICD-10-CM | POA: Diagnosis not present

## 2015-02-24 DIAGNOSIS — I4891 Unspecified atrial fibrillation: Secondary | ICD-10-CM | POA: Diagnosis not present

## 2015-02-24 DIAGNOSIS — R42 Dizziness and giddiness: Secondary | ICD-10-CM | POA: Diagnosis not present

## 2015-02-24 DIAGNOSIS — R7301 Impaired fasting glucose: Secondary | ICD-10-CM | POA: Diagnosis not present

## 2015-03-10 DIAGNOSIS — I129 Hypertensive chronic kidney disease with stage 1 through stage 4 chronic kidney disease, or unspecified chronic kidney disease: Secondary | ICD-10-CM | POA: Diagnosis not present

## 2015-03-10 DIAGNOSIS — I4891 Unspecified atrial fibrillation: Secondary | ICD-10-CM | POA: Diagnosis not present

## 2015-03-10 DIAGNOSIS — N182 Chronic kidney disease, stage 2 (mild): Secondary | ICD-10-CM | POA: Diagnosis not present

## 2015-03-10 DIAGNOSIS — R7303 Prediabetes: Secondary | ICD-10-CM | POA: Diagnosis not present

## 2015-03-12 DIAGNOSIS — I4891 Unspecified atrial fibrillation: Secondary | ICD-10-CM | POA: Diagnosis not present

## 2015-03-18 ENCOUNTER — Other Ambulatory Visit: Payer: Self-pay | Admitting: Internal Medicine

## 2015-03-19 DIAGNOSIS — I4891 Unspecified atrial fibrillation: Secondary | ICD-10-CM | POA: Diagnosis not present

## 2015-03-26 DIAGNOSIS — I4891 Unspecified atrial fibrillation: Secondary | ICD-10-CM | POA: Diagnosis not present

## 2015-04-07 DIAGNOSIS — I4891 Unspecified atrial fibrillation: Secondary | ICD-10-CM | POA: Diagnosis not present

## 2015-04-14 DIAGNOSIS — R251 Tremor, unspecified: Secondary | ICD-10-CM | POA: Diagnosis not present

## 2015-04-14 DIAGNOSIS — I4891 Unspecified atrial fibrillation: Secondary | ICD-10-CM | POA: Diagnosis not present

## 2015-05-05 DIAGNOSIS — Z6836 Body mass index (BMI) 36.0-36.9, adult: Secondary | ICD-10-CM | POA: Diagnosis not present

## 2015-05-05 DIAGNOSIS — R251 Tremor, unspecified: Secondary | ICD-10-CM | POA: Diagnosis not present

## 2015-05-05 DIAGNOSIS — J040 Acute laryngitis: Secondary | ICD-10-CM | POA: Diagnosis not present

## 2015-05-05 DIAGNOSIS — I4891 Unspecified atrial fibrillation: Secondary | ICD-10-CM | POA: Diagnosis not present

## 2015-06-11 ENCOUNTER — Encounter: Payer: Medicare HMO | Admitting: Internal Medicine

## 2015-06-23 DIAGNOSIS — I495 Sick sinus syndrome: Secondary | ICD-10-CM | POA: Diagnosis not present

## 2015-06-23 DIAGNOSIS — I4891 Unspecified atrial fibrillation: Secondary | ICD-10-CM | POA: Diagnosis not present

## 2015-06-23 DIAGNOSIS — R609 Edema, unspecified: Secondary | ICD-10-CM | POA: Diagnosis not present

## 2015-06-23 DIAGNOSIS — Z6835 Body mass index (BMI) 35.0-35.9, adult: Secondary | ICD-10-CM | POA: Diagnosis not present

## 2015-06-25 ENCOUNTER — Other Ambulatory Visit: Payer: Self-pay | Admitting: Internal Medicine

## 2015-07-03 DIAGNOSIS — R609 Edema, unspecified: Secondary | ICD-10-CM | POA: Diagnosis not present

## 2015-07-03 DIAGNOSIS — Z6836 Body mass index (BMI) 36.0-36.9, adult: Secondary | ICD-10-CM | POA: Diagnosis not present

## 2015-07-03 DIAGNOSIS — M79609 Pain in unspecified limb: Secondary | ICD-10-CM | POA: Diagnosis not present

## 2015-07-03 DIAGNOSIS — I4891 Unspecified atrial fibrillation: Secondary | ICD-10-CM | POA: Diagnosis not present

## 2015-07-07 DIAGNOSIS — M79662 Pain in left lower leg: Secondary | ICD-10-CM | POA: Diagnosis not present

## 2015-07-14 ENCOUNTER — Other Ambulatory Visit (HOSPITAL_COMMUNITY): Payer: Self-pay | Admitting: *Deleted

## 2015-07-14 MED ORDER — FUROSEMIDE 20 MG PO TABS
20.0000 mg | ORAL_TABLET | ORAL | Status: DC | PRN
Start: 2015-07-14 — End: 2015-08-21

## 2015-07-20 DIAGNOSIS — Z6836 Body mass index (BMI) 36.0-36.9, adult: Secondary | ICD-10-CM | POA: Diagnosis not present

## 2015-07-20 DIAGNOSIS — I4891 Unspecified atrial fibrillation: Secondary | ICD-10-CM | POA: Diagnosis not present

## 2015-07-28 ENCOUNTER — Ambulatory Visit (INDEPENDENT_AMBULATORY_CARE_PROVIDER_SITE_OTHER): Payer: Commercial Managed Care - HMO | Admitting: Internal Medicine

## 2015-07-28 ENCOUNTER — Encounter: Payer: Self-pay | Admitting: Internal Medicine

## 2015-07-28 VITALS — BP 144/80 | HR 101 | Ht 64.0 in | Wt 196.5 lb

## 2015-07-28 DIAGNOSIS — I482 Chronic atrial fibrillation: Secondary | ICD-10-CM | POA: Diagnosis not present

## 2015-07-28 DIAGNOSIS — I5030 Unspecified diastolic (congestive) heart failure: Secondary | ICD-10-CM | POA: Diagnosis not present

## 2015-07-28 DIAGNOSIS — I4821 Permanent atrial fibrillation: Secondary | ICD-10-CM

## 2015-07-28 DIAGNOSIS — I495 Sick sinus syndrome: Secondary | ICD-10-CM | POA: Diagnosis not present

## 2015-07-28 DIAGNOSIS — Z950 Presence of cardiac pacemaker: Secondary | ICD-10-CM | POA: Diagnosis not present

## 2015-07-28 LAB — CUP PACEART INCLINIC DEVICE CHECK
Battery Voltage: 2.93 V
Brady Statistic RA Percent Paced: 0 %
Date Time Interrogation Session: 20170627135057
Implantable Lead Implant Date: 20100616
Implantable Lead Location: 753859
Lead Channel Pacing Threshold Amplitude: 1 V
Lead Channel Setting Pacing Amplitude: 2 V
Lead Channel Setting Sensing Sensitivity: 2 mV
MDC IDC LEAD IMPLANT DT: 20100616
MDC IDC LEAD LOCATION: 753860
MDC IDC LEAD MODEL: 1948
MDC IDC MSMT BATTERY REMAINING LONGEVITY: 115.2
MDC IDC MSMT LEADCHNL RV IMPEDANCE VALUE: 712.5 Ohm
MDC IDC MSMT LEADCHNL RV PACING THRESHOLD PULSEWIDTH: 0.4 ms
MDC IDC MSMT LEADCHNL RV SENSING INTR AMPL: 12 mV
MDC IDC PG SERIAL: 2285053
MDC IDC SET LEADCHNL RV PACING PULSEWIDTH: 0.4 ms
MDC IDC STAT BRADY RV PERCENT PACED: 22 %
Pulse Gen Model: 2110

## 2015-07-28 MED ORDER — METOPROLOL TARTRATE 100 MG PO TABS: 100.0000 mg | ORAL_TABLET | Freq: Two times a day (BID) | ORAL | Status: DC

## 2015-07-28 NOTE — Patient Instructions (Signed)
Medication Instructions: 1) Increase metoprolol tartrate to 100 mg one tablet by mouth twice daily  Labwork: - none  Procedures/Testing: - Your physician has requested that you have an echocardiogram. Echocardiography is a painless test that uses sound waves to create images of your heart. It provides your doctor with information about the size and shape of your heart and how well your heart's chambers and valves are working. This procedure takes approximately one hour. There are no restrictions for this procedure.  Follow-Up: - Your physician recommends that you schedule a follow-up appointment in: 4-5 weeks with Dr. Graciela Husbands.  Any Additional Special Instructions Will Be Listed Below (If Applicable).     If you need a refill on your cardiac medications before your next appointment, please call your pharmacy.

## 2015-07-28 NOTE — Progress Notes (Signed)
Patient Care Team: Charlott Rakes, MD as PCP - General (Family Medicine) Charlott Rakes, MD as Referring Physician (Family Medicine)   HPI  [name] is a 82 y.o. female Seen in followup for pacer implanted for tachybrady and now with permanent AF  She continues with some dyspnea on exertion and this has been progressive. She is having problems with asymmetric edema. She was seen by her PCP who undertook a left lower extremity Doppler noted no DVT. Her bisoprolol was changed to metoprolol; not quite sure why. TSH 1/17 was normal  She notes that her heart rate is racing. Her frequently.  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   She previously was seen by the heart failure clinic; it has been more than 2 years.   Echo 10/14 EF>>55-60% Past Medical History  Diagnosis Date  . Permanent atrial fibrillation (HCC)     tachy-brady syndrome  . Hypertension   . Hyperlipidemia   . GERD (gastroesophageal reflux disease)   . Chest pain     with cath 2002 and 2004 showing mild non-obstructive CAD  . Osteoporosis   . Pacemaker-St. Jude     chronic; myoview 11/9. EF 81% probable soft-tissue attenuation in anterior and lat walls, can't exclude ischemia  . Obesity   . Stroke (HCC)   . (HFpEF) heart failure with preserved ejection fraction [facility])     Past Surgical History  Procedure Laterality Date  . Cardiac catheterization    . Breast lumpectomy      right  . Total abdominal hysterectomy w/ bilateral salpingoophorectomy  1974  . Colectomy  1991  . Pacemaker insertion      St Jude  . Splenectomy      Current Outpatient Prescriptions  Medication Sig Dispense Refill  . calcium carbonate (TUMS) 500 MG chewable tablet Chew 1 tablet by mouth daily as needed for indigestion or heartburn.    . diltiazem (CARDIZEM CD) 240 MG 24 hr capsule Take 240 mg by mouth daily.     . furosemide (LASIX) 20 MG tablet Take 1 tablet (20 mg total) by mouth as needed. 30 tablet 6  . isosorbide  mononitrate (IMDUR) 30 MG 24 hr tablet TAKE 1 TABLET ONCE DAILY. 90 tablet 3  . meclizine (ANTIVERT) 25 MG tablet as needed.    [name] omeprazole (PRILOSEC) 20 MG capsule Take 20 mg by mouth daily.      . pravastatin (PRAVACHOL) 40 MG tablet Take 1 tablet (40 mg total) by mouth daily. 30 tablet 2  . rOPINIRole (REQUIP) 0.5 MG tablet Take 0.5 mg by mouth at bedtime.    [name] warfarin (COUMADIN) 1 MG tablet Take 1 mg by mouth as directed.     . warfarin (COUMADIN) 3 MG tablet Take 1.5-3 mg by mouth daily. On Tuesdays and Thursdays take half of the 3mg , Then whole tablet rest of days     No current facility-administered medications for this visit.    Allergies  Allergen Reactions  . Morphine And Related Other (See Comments)    hallucinations    Review of Systems negative except from HPI and PMH  Physical Exam BP 144/80 mmHg  Pulse 101  Ht 5\' 4"  (1.626 m)  Wt 196 lb 8 oz (89.132 kg)  BMI 33.71 kg/m2 Well developed and well nourished in no acute distress HENT normal E scleral and icterus clear Neck Supple JVP 7-8 ; carotids brisk and full Clear to ausculation Kyphosis Device pocket well healed; without hematoma or erythema.  There is no tethering Regular rate and rhythm, no murmurs gallops or rub Soft with active bowel sounds No clubbing cyanosis tr Edema Alert and oriented, grossly normal motor and sensory function Skin Warm and Dry  ECG demonstrates atrial fibrillation with intermittent ventricular pacing  Assessment and  Plan  HFpEF     Atrial fib-permanent Increasingly rapid ventricular response Continue warfarin  Hypertension  Well controlled  Pacemaker St Jude The patient's device was interrogated.  The information was reviewed. No changes were made in the programming.      She is having progressive problems with shortness of breath with very rapid rates. 7% of her heart rates are faster than 120 bpm. No clear primary cause is evident with normal hemoglobin and normal TSH  in the last 6 months. It may well be contributing to heart failure and there may be a consequence in terms of left ventricular dysfunction. We will plan to repeat her echocardiogram. In the interim we will increase her metoprolol 50--100 twice a day. If there is LV dysfunction we will add digoxin. If we can't control her heart rate medically, I think would be reasonable particularly for LV function is reasonable to undertake AV junction ablation for symptom relief  There is some degree of volume overload. She has unilateral edema primarily. We discussed the importance of restricting by mouth fluid intake  Blood pressure is reasonably controlled

## 2015-08-03 ENCOUNTER — Other Ambulatory Visit: Payer: Commercial Managed Care - HMO

## 2015-08-11 ENCOUNTER — Telehealth (HOSPITAL_COMMUNITY): Payer: Self-pay | Admitting: Vascular Surgery

## 2015-08-11 NOTE — Telephone Encounter (Signed)
Left pt message to reschedule apt 08/27/15

## 2015-08-13 ENCOUNTER — Other Ambulatory Visit: Payer: Commercial Managed Care - HMO

## 2015-08-18 ENCOUNTER — Encounter (HOSPITAL_COMMUNITY): Payer: Self-pay | Admitting: *Deleted

## 2015-08-18 ENCOUNTER — Telehealth (HOSPITAL_COMMUNITY): Payer: Self-pay | Admitting: Vascular Surgery

## 2015-08-18 ENCOUNTER — Emergency Department (HOSPITAL_COMMUNITY): Payer: Commercial Managed Care - HMO

## 2015-08-18 ENCOUNTER — Observation Stay (HOSPITAL_COMMUNITY)
Admission: EM | Admit: 2015-08-18 | Discharge: 2015-08-21 | Disposition: A | Discharge status: Home / Self Care | Payer: Commercial Managed Care - HMO | Attending: Cardiovascular Disease | Admitting: Cardiovascular Disease

## 2015-08-18 DIAGNOSIS — E669 Obesity, unspecified: Secondary | ICD-10-CM | POA: Insufficient documentation

## 2015-08-18 DIAGNOSIS — R110 Nausea: Secondary | ICD-10-CM | POA: Diagnosis not present

## 2015-08-18 DIAGNOSIS — I495 Sick sinus syndrome: Secondary | ICD-10-CM | POA: Diagnosis not present

## 2015-08-18 DIAGNOSIS — I509 Heart failure, unspecified: Secondary | ICD-10-CM | POA: Diagnosis not present

## 2015-08-18 DIAGNOSIS — R0789 Other chest pain: Secondary | ICD-10-CM | POA: Diagnosis not present

## 2015-08-18 DIAGNOSIS — K449 Diaphragmatic hernia without obstruction or gangrene: Secondary | ICD-10-CM | POA: Diagnosis not present

## 2015-08-18 DIAGNOSIS — I083 Combined rheumatic disorders of mitral, aortic and tricuspid valves: Secondary | ICD-10-CM | POA: Insufficient documentation

## 2015-08-18 DIAGNOSIS — Z6834 Body mass index (BMI) 34.0-34.9, adult: Secondary | ICD-10-CM | POA: Insufficient documentation

## 2015-08-18 DIAGNOSIS — M40204 Unspecified kyphosis, thoracic region: Secondary | ICD-10-CM | POA: Insufficient documentation

## 2015-08-18 DIAGNOSIS — I491 Atrial premature depolarization: Secondary | ICD-10-CM | POA: Diagnosis not present

## 2015-08-18 DIAGNOSIS — I482 Chronic atrial fibrillation, unspecified: Secondary | ICD-10-CM | POA: Diagnosis present

## 2015-08-18 DIAGNOSIS — I5033 Acute on chronic diastolic (congestive) heart failure: Secondary | ICD-10-CM | POA: Diagnosis present

## 2015-08-18 DIAGNOSIS — I10 Essential (primary) hypertension: Secondary | ICD-10-CM | POA: Diagnosis present

## 2015-08-18 DIAGNOSIS — I700 Atherosclerosis of aorta: Secondary | ICD-10-CM | POA: Diagnosis not present

## 2015-08-18 DIAGNOSIS — Z5181 Encounter for therapeutic drug level monitoring: Secondary | ICD-10-CM

## 2015-08-18 DIAGNOSIS — R0989 Other specified symptoms and signs involving the circulatory and respiratory systems: Secondary | ICD-10-CM | POA: Diagnosis not present

## 2015-08-18 DIAGNOSIS — Z7901 Long term (current) use of anticoagulants: Secondary | ICD-10-CM | POA: Insufficient documentation

## 2015-08-18 DIAGNOSIS — R778 Other specified abnormalities of plasma proteins: Secondary | ICD-10-CM

## 2015-08-18 DIAGNOSIS — I2510 Atherosclerotic heart disease of native coronary artery without angina pectoris: Secondary | ICD-10-CM | POA: Diagnosis not present

## 2015-08-18 DIAGNOSIS — R079 Chest pain, unspecified: Secondary | ICD-10-CM | POA: Diagnosis not present

## 2015-08-18 DIAGNOSIS — Z8673 Personal history of transient ischemic attack (TIA), and cerebral infarction without residual deficits: Secondary | ICD-10-CM | POA: Insufficient documentation

## 2015-08-18 DIAGNOSIS — E785 Hyperlipidemia, unspecified: Secondary | ICD-10-CM | POA: Insufficient documentation

## 2015-08-18 DIAGNOSIS — K219 Gastro-esophageal reflux disease without esophagitis: Secondary | ICD-10-CM | POA: Diagnosis not present

## 2015-08-18 DIAGNOSIS — I959 Hypotension, unspecified: Secondary | ICD-10-CM | POA: Diagnosis not present

## 2015-08-18 DIAGNOSIS — R0602 Shortness of breath: Secondary | ICD-10-CM | POA: Diagnosis not present

## 2015-08-18 DIAGNOSIS — R7989 Other specified abnormal findings of blood chemistry: Secondary | ICD-10-CM | POA: Diagnosis present

## 2015-08-18 DIAGNOSIS — I5032 Chronic diastolic (congestive) heart failure: Secondary | ICD-10-CM | POA: Diagnosis present

## 2015-08-18 DIAGNOSIS — Z950 Presence of cardiac pacemaker: Secondary | ICD-10-CM | POA: Diagnosis not present

## 2015-08-18 DIAGNOSIS — Q2546 Tortuous aortic arch: Secondary | ICD-10-CM | POA: Insufficient documentation

## 2015-08-18 DIAGNOSIS — I110 Hypertensive heart disease with heart failure: Principal | ICD-10-CM | POA: Insufficient documentation

## 2015-08-18 DIAGNOSIS — Z45018 Encounter for adjustment and management of other part of cardiac pacemaker: Secondary | ICD-10-CM

## 2015-08-18 HISTORY — DX: Hypotension, unspecified: I95.9

## 2015-08-18 HISTORY — DX: Other abnormalities of gait and mobility: R26.89

## 2015-08-18 HISTORY — DX: Atherosclerotic heart disease of native coronary artery without angina pectoris: I25.10

## 2015-08-18 HISTORY — DX: Unspecified osteoarthritis, unspecified site: M19.90

## 2015-08-18 HISTORY — DX: Encounter for therapeutic drug level monitoring: Z51.81

## 2015-08-18 HISTORY — DX: Long term (current) use of anticoagulants: Z79.01

## 2015-08-18 HISTORY — DX: Chronic diastolic (congestive) heart failure: I50.32

## 2015-08-18 LAB — BASIC METABOLIC PANEL
ANION GAP: 6 (ref 5–15)
BUN: 7 mg/dL (ref 6–20)
CALCIUM: 9.2 mg/dL (ref 8.9–10.3)
CO2: 27 mmol/L (ref 22–32)
CREATININE: 0.97 mg/dL (ref 0.44–1.00)
Chloride: 106 mmol/L (ref 101–111)
GFR calc non Af Amer: 50 mL/min — ABNORMAL LOW (ref >60)
GFR, EST AFRICAN AMERICAN: 58 mL/min — AB (ref >60)
Glucose, Bld: 99 mg/dL (ref 65–99)
Potassium: 4.6 mmol/L (ref 3.5–5.1)
SODIUM: 139 mmol/L (ref 135–145)

## 2015-08-18 LAB — PROTIME-INR
INR: 2.03 — ABNORMAL HIGH (ref 0.00–1.49)
PROTHROMBIN TIME: 22.9 s — AB (ref 11.6–15.2)

## 2015-08-18 LAB — I-STAT TROPONIN, ED
TROPONIN I, POC: 0.09 ng/mL — AB (ref 0.00–0.08)
Troponin i, poc: 0.01 ng/mL (ref 0.00–0.08)

## 2015-08-18 LAB — BRAIN NATRIURETIC PEPTIDE: B NATRIURETIC PEPTIDE 5: 582 pg/mL — AB (ref 0.0–100.0)

## 2015-08-18 LAB — CBC
HEMATOCRIT: 41.9 % (ref 36.0–46.0)
Hemoglobin: 14 g/dL (ref 12.0–15.0)
MCH: 31.7 pg (ref 26.0–34.0)
MCHC: 33.4 g/dL (ref 30.0–36.0)
MCV: 94.8 fL (ref 78.0–100.0)
PLATELETS: 125 10*3/uL — AB (ref 150–400)
RBC: 4.42 MIL/uL (ref 3.87–5.11)
RDW: 14.1 % (ref 11.5–15.5)
WBC: 7.4 10*3/uL (ref 4.0–10.5)

## 2015-08-18 LAB — TSH: TSH: 4.568 u[IU]/mL — ABNORMAL HIGH (ref 0.350–4.500)

## 2015-08-18 LAB — TROPONIN I

## 2015-08-18 MED ORDER — ROPINIROLE HCL 0.5 MG PO TABS
0.5000 mg | ORAL_TABLET | Freq: Every day | ORAL | Status: DC
  Administered 2015-08-18 – 2015-08-20 (×3): 0.5 mg via ORAL
  Filled 2015-08-18 (×3): qty 1

## 2015-08-18 MED ORDER — METOPROLOL TARTRATE 25 MG PO TABS
100.0000 mg | ORAL_TABLET | Freq: Once | ORAL | Status: AC
  Administered 2015-08-18: 100 mg via ORAL
  Filled 2015-08-18: qty 4

## 2015-08-18 MED ORDER — WARFARIN SODIUM 3 MG PO TABS
3.0000 mg | ORAL_TABLET | Freq: Once | ORAL | Status: AC
  Administered 2015-08-18: 3 mg via ORAL
  Filled 2015-08-18: qty 1

## 2015-08-18 MED ORDER — ISOSORBIDE MONONITRATE ER 30 MG PO TB24
30.0000 mg | ORAL_TABLET | Freq: Every day | ORAL | Status: DC
  Administered 2015-08-18 – 2015-08-20 (×2): 30 mg via ORAL
  Filled 2015-08-18 (×3): qty 1

## 2015-08-18 MED ORDER — CARBIDOPA-LEVODOPA ER 50-200 MG PO TBCR
1.0000 | EXTENDED_RELEASE_TABLET | Freq: Two times a day (BID) | ORAL | Status: DC
  Administered 2015-08-18 – 2015-08-21 (×6): 1 via ORAL
  Filled 2015-08-18 (×7): qty 1

## 2015-08-18 MED ORDER — ACETAMINOPHEN 325 MG PO TABS
650.0000 mg | ORAL_TABLET | ORAL | Status: DC | PRN
  Administered 2015-08-19: 650 mg via ORAL
  Filled 2015-08-18: qty 2

## 2015-08-18 MED ORDER — SODIUM CHLORIDE 0.9% FLUSH
3.0000 mL | Freq: Two times a day (BID) | INTRAVENOUS | Status: DC
  Administered 2015-08-18 – 2015-08-21 (×6): 3 mL via INTRAVENOUS

## 2015-08-18 MED ORDER — FUROSEMIDE 10 MG/ML IJ SOLN
20.0000 mg | Freq: Once | INTRAMUSCULAR | Status: AC
  Administered 2015-08-18: 20 mg via INTRAVENOUS
  Filled 2015-08-18: qty 2

## 2015-08-18 MED ORDER — ASPIRIN EC 81 MG PO TBEC
81.0000 mg | DELAYED_RELEASE_TABLET | Freq: Every day | ORAL | Status: DC
  Administered 2015-08-19 – 2015-08-21 (×3): 81 mg via ORAL
  Filled 2015-08-18 (×3): qty 1

## 2015-08-18 MED ORDER — PANTOPRAZOLE SODIUM 40 MG PO TBEC
40.0000 mg | DELAYED_RELEASE_TABLET | Freq: Every day | ORAL | Status: DC
  Administered 2015-08-18 – 2015-08-21 (×4): 40 mg via ORAL
  Filled 2015-08-18 (×4): qty 1

## 2015-08-18 MED ORDER — METOPROLOL TARTRATE 100 MG PO TABS
100.0000 mg | ORAL_TABLET | Freq: Two times a day (BID) | ORAL | Status: DC
  Filled 2015-08-18: qty 1

## 2015-08-18 MED ORDER — SODIUM CHLORIDE 0.9% FLUSH: 3.0000 mL | INTRAVENOUS | Status: DC | PRN

## 2015-08-18 MED ORDER — PRAVASTATIN SODIUM 40 MG PO TABS
40.0000 mg | ORAL_TABLET | Freq: Every day | ORAL | Status: DC
  Administered 2015-08-18 – 2015-08-19 (×2): 40 mg via ORAL
  Filled 2015-08-18 (×2): qty 1

## 2015-08-18 MED ORDER — ONDANSETRON HCL 4 MG/2ML IJ SOLN
4.0000 mg | Freq: Four times a day (QID) | INTRAMUSCULAR | Status: DC | PRN
  Administered 2015-08-19: 4 mg via INTRAVENOUS
  Filled 2015-08-18: qty 2

## 2015-08-18 MED ORDER — DILTIAZEM HCL ER COATED BEADS 240 MG PO CP24
240.0000 mg | ORAL_CAPSULE | Freq: Every day | ORAL | Status: DC
  Administered 2015-08-18 – 2015-08-19 (×2): 240 mg via ORAL
  Filled 2015-08-18 (×3): qty 1

## 2015-08-18 MED ORDER — MECLIZINE HCL 25 MG PO TABS: 25.0000 mg | ORAL_TABLET | Freq: Every day | ORAL | Status: DC | PRN

## 2015-08-18 MED ORDER — WARFARIN - PHARMACIST DOSING INPATIENT
Freq: Every day | Status: DC
  Administered 2015-08-18 – 2015-08-20 (×2)

## 2015-08-18 MED ORDER — SODIUM CHLORIDE 0.9 % IV SOLN: 250.0000 mL | INTRAVENOUS | Status: DC | PRN

## 2015-08-18 NOTE — H&P (Signed)
Cardiology History & Physical    Patient ID: [name] MRN: [id number], DOB: 1923/05/21 Date of Encounter: 08/18/2015, 5:07 PM Primary Physician: Charlott Rakes, MD Primary Cardiologist: Bensimhon (CHF), Graciela Husbands (EP)  Chief Complaint: "smothering" & chest pressure Reason for Admission: see above Requesting MD: Ms. Brunetta Genera  HPI: [name] is an 82 y/o F with history of permanent atrial fib, syncope with tachybrady syndrome s/p PPM 2010, chronic dyspnea, h/o bradycardia (bisoprolol reduced), HTN, GERD, PACs by event monitor, nonobstructive CAD by cath in 09/2010 (neg nuc 2011), balance issues, stroke 2015 who presented to [facility] with complaints of CP and SOB. Per her report, "I should probably take my medicines like the doctors tell me to" - she has not been faithful with her PRN diuretic or BP meds. She does not like how the fluid pill makes her incontinent despite wearing depends. Last night she felt "smothering" all night - had to sit up on the side of the bed due to dyspnea. This AM she still felt SOB. She noticed a sensation of chest pain, sharp, going through to her back very briefly which then settled into a dull chest pressure which has been constant. This improved with SL NTG. She can still feel an overall sensation of fullness. She feels like she has retained fluid. Doesn't drink much liquid per day. Doesn't weigh regularly due to balance issues. No weight on file in ER. She received 324mg  ASA by EMS. In the ED she is noted to be hypertensive up to 170/124. She has noticed subjective improvement in dyspnea with 2L O2. She was also given 100mg  oral Lopressor and 20mg  IV Lasix. Labs notable for Cr 0.97, BNP 582, troponin 0.01->0.9, Hgb 14.0, INR 2.03. CXR: pulm vasc congestion, small left pleural effusion, right base atx. With 20mg  IV Lasix she is already beginning to diurese briskly.  Past Medical History  Diagnosis Date  . Permanent atrial fibrillation (HCC)     a. c/b tachy-brady  syndrome s/p PPM 2010.  Hypertension   . Hyperlipidemia   . GERD (gastroesophageal reflux disease)   . Mild CAD     a. nonobst CAD by cath 09/2010.  . Osteoporosis   . Pacemaker-St. Jude   . Obesity   . Stroke (HCC)   . Chronic diastolic CHF (congestive heart failure) (HCC)   . Dyspnea   . Balance problem      Surgical History:  Past Surgical History  Procedure Laterality Date  . Cardiac catheterization    . Breast lumpectomy      right  . Total abdominal hysterectomy w/ bilateral salpingoophorectomy  1974  . Colectomy  1991  . Pacemaker insertion      St Jude  . Splenectomy       Home Meds: Prior to Admission medications   Medication Sig Start Date End Date Taking? Authorizing Provider  calcium carbonate (TUMS) 500 MG chewable tablet Chew 1 tablet by mouth daily as needed for indigestion or heartburn.   Yes Historical Provider, MD  carbidopa-levodopa (SINEMET CR) 50-200 MG tablet Take 1 tablet by mouth 2 (two) times daily. 08/10/15  Yes Historical Provider, MD  diltiazem (CARDIZEM CD) 240 MG 24 hr capsule Take 240 mg by mouth daily.  05/29/14  Yes Historical Provider, MD  furosemide (LASIX) 20 MG tablet Take 1 tablet (20 mg total) by mouth as needed. Patient taking differently: Take 20 mg by mouth daily as needed for fluid.  07/14/15  Yes 10/2010, MD  isosorbide  mononitrate (IMDUR) 30 MG 24 hr tablet TAKE 1 TABLET ONCE DAILY. 03/18/15  Yes Duke Salvia, MD  metoprolol (LOPRESSOR) 100 MG tablet Take 1 tablet (100 mg total) by mouth 2 (two) times daily. 07/28/15  Yes Duke Salvia, MD  omeprazole (PRILOSEC) 20 MG capsule Take 20 mg by mouth daily.     Yes Historical Provider, MD  pravastatin (PRAVACHOL) 40 MG tablet Take 1 tablet (40 mg total) by mouth daily. 08/23/13  Yes Richarda Overlie, MD  rOPINIRole (REQUIP) 0.5 MG tablet Take 0.5 mg by mouth at bedtime.   Yes Historical Provider, MD  warfarin (COUMADIN) 3 MG tablet Take 3 mg by mouth daily.    Yes Historical  Provider, MD  meclizine (ANTIVERT) 25 MG tablet Take 25 mg by mouth as needed for dizziness.  04/30/14   Historical Provider, MD    Allergies:  Allergies  Allergen Reactions  . Morphine And Related Other (See Comments)    hallucinations    Social History   Social History  . Marital Status: Married    Spouse Name: N/A  . Number of Children: N/A  . Years of Education: N/A   Occupational History  . Not on file.   Social History Main Topics  . Smoking status: Never Smoker   . Smokeless tobacco: Not on file  . Alcohol Use: No  . Drug Use: No  . Sexual Activity: Not on file   Other Topics Concern  . Not on file   Social History Narrative     Family History  Problem Relation Age of Onset  . Cancer Other   . Heart disease Other   . Hypertension Other     Review of Systems: All other systems reviewed and are otherwise negative except as noted above.  Labs:   Lab Results  Component Value Date   WBC 7.4 08/18/2015   HGB 14.0 08/18/2015   HCT 41.9 08/18/2015   MCV 94.8 08/18/2015   PLT 125* 08/18/2015    Recent Labs Lab 08/18/15 1322  NA 139  K 4.6  CL 106  CO2 27  BUN 7  CREATININE 0.97  CALCIUM 9.2  GLUCOSE 99    Lab Results  Component Value Date   CHOL 207* 08/22/2013   HDL 43 08/22/2013   LDLCALC 124* 08/22/2013   TRIG 199* 08/22/2013   Lab Results  Component Value Date   DDIMER 0.31 02/21/2012    Radiology/Studies:  Dg Chest 2 View  08/18/2015  CLINICAL DATA:  82 year old female with left-sided chest pain extending into left arm for the past day. Shortness breath. Hypertension. Nonsmoker. Initial encounter. EXAM: CHEST  2 VIEW COMPARISON:  11/02/2014 and 06/20/2014 chest x-ray. 03/21/2014 chest CT. FINDINGS: Sequential pacer in place with leads unchanged in position. Cardiomegaly. Calcified tortuous aorta. Pulmonary vascular prominence. Small left pleural effusion. Right base subsegmental atelectasis. No pneumothorax. Large hiatal hernia. CT  detected tiny pulmonary nodules not delineated by plain film exam. Please see prior CT report. Thoracic kyphosis similar to prior exam. IMPRESSION: Sequential pacer in place.  Cardiomegaly. Calcified tortuous aorta.  Aortic atherosclerosis. Pulmonary vascular congestion with small left pleural effusion. Right base subsegmental atelectasis. Large hiatal hernia. CT detected tiny pulmonary nodules not delineated by plain film exam. Please see prior CT report. Electronically Signed   By: Lacy Duverney M.D.   On: 08/18/2015 12:16   Wt Readings from Last 3 Encounters:  07/28/15 196 lb 8 oz (89.132 kg)  06/03/14 196 lb 8 oz (89.132 kg)  04/10/14 196 lb 12 oz (89.245 kg)    EKG: atrial fib 85bpm, nonspecific ST-T changes  Physical Exam: Blood pressure 140/86, pulse 78, temperature 98.5 F (36.9 C), temperature source Oral, resp. rate 21, height 5' (1.524 m), SpO2 98 %. There is no weight on file to calculate BMI. General: Well developed, well nourished WF, in no acute distress. Kyphotic posture Head: Normocephalic, atraumatic, sclera non-icteric, no xanthomas, nares are without discharge.  Neck: JVD not elevated. Lungs: Diminished BS at bases, otherwise clear bilaterally to auscultation without wheezes, rales, or rhonchi. Breathing is unlabored. Heart: Irregularly irregular with S1 S2. No murmurs, rubs, or gallops appreciated. Abdomen: Soft, non-tender, non-distended with normoactive bowel sounds. No hepatomegaly. No rebound/guarding. No obvious abdominal masses. Msk:  Strength and tone appear normal for age. Extremities: No clubbing or cyanosis. 1+ BLE edema (L>R).  Distal pedal pulses are 2+ and equal bilaterally. Neuro: Alert and oriented X 3. No focal deficit. No facial asymmetry. Moves all extremities spontaneously. Psych:  Responds to questions appropriately with a normal affect.    Assessment and Plan   1. Acute on chronic diastolic CHF - plan to admit, diurese with another dose of IV Lasix  this PM and f/u volume status in AM before re-dosing given brisk response in ER. Follow weights, I/O's. Med compliance reinforced.  Update EF with echo last done in 2015  2. Chest pressure - difficult to know if related to CHF versus underlying CAD. Per d/w MD, cycle enzymes but continue Coumadin for now and consider outpatient stress test.  3. Accelerated HTN - follow with resumption of BP meds.  4. Chronic afib - rate controlled. Follow on home regimen. Continue Coumadin.   Signed, Marilyn Montana PA-C 08/18/2015, 5:07 PM Pager: [phone]  Patient examined chart reviewed.Chronic afib with Rx coumadin Diastolic CHF with med compliance Issues Weight up and LE edema.  BNP elevated Good response to iv lasix. No chest pain on my interview Given age no acute ECG changes and negative troponin would not w/u ischemia further.; Will update Echo for EF since last one was in 20 15  Suspect she will need 48 hr stay.    Charlton Haws

## 2015-08-18 NOTE — ED Notes (Signed)
Hospital provider at bedside

## 2015-08-18 NOTE — ED Notes (Signed)
MD at bedside. 

## 2015-08-18 NOTE — ED Notes (Signed)
Report called to receiving RN Shanda Bumps at this time. VS; 81 HR, 16 RR, 100% on RA, 155/87 B/P.

## 2015-08-18 NOTE — ED Notes (Signed)
PT reports she first had CP that radiated into LT arm last night.  Pt reports this AM she had Chest Pressure on LT side to radiated to back.Pt took 1 NGT at home and on arrival The EMS team gave PT 2 additional NGT SL  With lowered her Chest pressure from 8/10 to 1/10 . Pt was slso given 324mg  of ASA by EMS.

## 2015-08-18 NOTE — Progress Notes (Signed)
ANTICOAGULATION CONSULT NOTE - Initial Consult  Pharmacy Consult for Warfarin Indication: atrial fibrillation  Allergies  Allergen Reactions  . Morphine And Related Other (See Comments)    hallucinations    Patient Measurements: Height: 5' (152.4 cm) IBW/kg (Calculated) : 45.5  Vital Signs: Temp: 98.5 F (36.9 C) (07/18 1109) Temp Source: Oral (07/18 1109) BP: 165/101 mmHg (07/18 1715) Pulse Rate: 41 (07/18 1715)  Labs:  Recent Labs  08/18/15 1135 08/18/15 1322  HGB 14.0  --   HCT 41.9  --   PLT 125*  --   LABPROT  --  22.9*  INR  --  2.03*  CREATININE  --  0.97    CrCl cannot be calculated (Unknown ideal weight.).   Medical History: Past Medical History  Diagnosis Date  . Permanent atrial fibrillation (HCC)     a. c/b tachy-brady syndrome s/p PPM 2010.  [name] Hypertension   . Hyperlipidemia   . GERD (gastroesophageal reflux disease)   . Mild CAD     a. nonobst CAD by cath 09/2010.  . Osteoporosis   . Pacemaker-St. Jude   . Obesity   . Stroke (HCC)   . Chronic diastolic CHF (congestive heart failure) (HCC)   . Dyspnea   . Balance problem     Medications:   (Not in a hospital admission) Scheduled:  Infusions:   Assessment: 82yo female with history of Afib on warfarin PTA presents with CP. Pharmacy is consulted to dose warfarin for atrial fibrillation.   PTA Warfarin Dose:  3mg /d with last dose 08/17/15  Goal of Therapy:  INR 2-3 Monitor platelets by anticoagulation protocol: Yes   Plan:  Warfarin 3mg  tonight x1 Daily INR Monitor s/sx of bleeding  08/19/15. , PharmD, BCPS Clinical Pharmacist Pager [phone] 08/18/2015,5:49 PM

## 2015-08-18 NOTE — Telephone Encounter (Signed)
Left message w/ Pt grandson he will get the message for his wife to call back to reschedule pt appt

## 2015-08-18 NOTE — ED Provider Notes (Signed)
CSN: [id number]     Arrival date & time 08/18/15  1054 History   First MD Initiated Contact with Patient 08/18/15 1057     Chief Complaint  Patient presents with  . Chest Pain    HPI   [name] is an 82 y.o. female with history of pacemaker for tachybrady, now with chronic afib (on Coumadin), HTN, HLD, stroke, CHF who presents to the ED for evaluation of chest pain and shortness of breath. She states she was in her usual state of health until last night when she started noticing left sided chest pain that radiated to her arm. That resolved but then this morning pt started feeling left side intense chest pressure and she felt short of breath. She states this pressure radiated to her back. She took 1 SL nitro at home and en route to the ED via EMS she was given 2 SL nitro and 324mg  ASA. She states now the pressure is much improved. She states it is not painful. She states she still feels short of breath. [name] states she has chronic dyspnea on exertion but this feels different. She does also have chronic issues with fluid overload and LE edema; recent DVT study negative. Pt sees Dr. Delford Field of cardiology and had recent office follow up in June. She is scheduled for an echocardiogram next week. Denies diaphoresis. Endorses some nausea but denies emesis. Denies fever, chills, abdominal pain, weakness, numbness. She has no oxygen requirement at baseline but in the ED states 2L via [place] is helping her feel better. Pt is accompanied by her granddaughter, July, who does note that pt has had some irregularities in taking her lasix which is supposed to be 20mg  PO daily plus 20mg  PO as needed for swelling.  Past Medical History  Diagnosis Date  . Permanent atrial fibrillation (HCC)     tachy-brady syndrome  . Hypertension   . Hyperlipidemia   . GERD (gastroesophageal reflux disease)   . Chest pain     with cath 2002 and 2004 showing mild non-obstructive CAD  . Osteoporosis   . Pacemaker-St.  Jude     chronic; myoview 11/9. EF 81% probable soft-tissue attenuation in anterior and lat walls, can't exclude ischemia  . Obesity   . Stroke (HCC)   . (HFpEF) heart failure with preserved ejection fraction [facility])    Past Surgical History  Procedure Laterality Date  . Cardiac catheterization    . Breast lumpectomy      right  . Total abdominal hysterectomy w/ bilateral salpingoophorectomy  1974  . Colectomy  1991  . Pacemaker insertion      St Jude  . Splenectomy     Family History  Problem Relation Age of Onset  . Cancer Other   . Heart disease Other   . Hypertension Other    Social History  Substance Use Topics  . Smoking status: Never Smoker   . Smokeless tobacco: Not on file  . Alcohol Use: No   OB History    No data available     Review of Systems  All other systems reviewed and are negative.     Allergies  Morphine and related  Home Medications   Prior to Admission medications   Medication Sig Start Date End Date Taking? Authorizing Provider  calcium carbonate (TUMS) 500 MG chewable tablet Chew 1 tablet by mouth daily as needed for indigestion or heartburn.    Historical Provider, MD  diltiazem (CARDIZEM CD) 240 MG 24  hr capsule Take 240 mg by mouth daily.  05/29/14   Historical Provider, MD  furosemide (LASIX) 20 MG tablet Take 1 tablet (20 mg total) by mouth as needed. 07/14/15   Dolores Patty, MD  isosorbide mononitrate (IMDUR) 30 MG 24 hr tablet TAKE 1 TABLET ONCE DAILY. 03/18/15   Duke Salvia, MD  meclizine (ANTIVERT) 25 MG tablet as needed. 04/30/14   Historical Provider, MD  metoprolol (LOPRESSOR) 100 MG tablet Take 1 tablet (100 mg total) by mouth 2 (two) times daily. 07/28/15   Duke Salvia, MD  omeprazole (PRILOSEC) 20 MG capsule Take 20 mg by mouth daily.      Historical Provider, MD  pravastatin (PRAVACHOL) 40 MG tablet Take 1 tablet (40 mg total) by mouth daily. 08/23/13   Richarda Overlie, MD  rOPINIRole (REQUIP) 0.5 MG tablet Take 0.5 mg by  mouth at bedtime.    Historical Provider, MD  warfarin (COUMADIN) 1 MG tablet Take 1 mg by mouth as directed.  05/12/14   Historical Provider, MD  warfarin (COUMADIN) 3 MG tablet Take 1.5-3 mg by mouth daily. On Tuesdays and Thursdays take half of the 3mg , Then whole tablet rest of days    Historical Provider, MD   BP 149/118 mmHg  Pulse 73  Temp(Src) 98.5 F (36.9 C) (Oral)  Resp 23  Ht 5' (1.524 m)  SpO2 98% Physical Exam  Constitutional: She is oriented to person, place, and time.  HENT:  Right Ear: External ear normal.  Left Ear: External ear normal.  Nose: Nose normal.  Mouth/Throat: Oropharynx is clear and moist. No oropharyngeal exudate.  Eyes: Conjunctivae and EOM are normal.  Neck: Normal range of motion. Neck supple.  Cardiovascular: Normal rate, regular rhythm, normal heart sounds and intact distal pulses.   Pulmonary/Chest: Effort normal and breath sounds normal. No respiratory distress. She has no wheezes. She has no rales.  No tachypnea No hypoxia No wheezes, rhonchi, or rales Speaking in full sentences  Abdominal: Soft. Bowel sounds are normal. She exhibits no distension. There is no tenderness. There is no rebound and no guarding.  Musculoskeletal:  Bilateral LE with 1-2+ pitting edema 2+ pulses  Neurological: She is alert and oriented to person, place, and time. No cranial nerve deficit.  Skin: Skin is warm and dry.  Psychiatric: She has a normal mood and affect.  Nursing note and vitals reviewed.   ED Course  Procedures (including critical care time) Labs Review Labs Reviewed  CBC - Abnormal; Notable for the following:    Platelets 125 (*)    All other components within normal limits  BRAIN NATRIURETIC PEPTIDE - Abnormal; Notable for the following:    B Natriuretic Peptide 582.0 (*)    All other components within normal limits  PROTIME-INR - Abnormal; Notable for the following:    Prothrombin Time 22.9 (*)    INR 2.03 (*)    All other components  within normal limits  BASIC METABOLIC PANEL - Abnormal; Notable for the following:    GFR calc non Af Amer 50 (*)    GFR calc Af Amer 58 (*)    All other components within normal limits  I-STAT TROPOININ, ED - Abnormal; Notable for the following:    Troponin i, poc 0.09 (*)    All other components within normal limits  I-STAT TROPOININ, ED    Imaging Review Dg Chest 2 View  08/18/2015  CLINICAL DATA:  82 year old female with left-sided chest pain extending into left arm  for the past day. Shortness breath. Hypertension. Nonsmoker. Initial encounter. EXAM: CHEST  2 VIEW COMPARISON:  11/02/2014 and 06/20/2014 chest x-ray. 03/21/2014 chest CT. FINDINGS: Sequential pacer in place with leads unchanged in position. Cardiomegaly. Calcified tortuous aorta. Pulmonary vascular prominence. Small left pleural effusion. Right base subsegmental atelectasis. No pneumothorax. Large hiatal hernia. CT detected tiny pulmonary nodules not delineated by plain film exam. Please see prior CT report. Thoracic kyphosis similar to prior exam. IMPRESSION: Sequential pacer in place.  Cardiomegaly. Calcified tortuous aorta.  Aortic atherosclerosis. Pulmonary vascular congestion with small left pleural effusion. Right base subsegmental atelectasis. Large hiatal hernia. CT detected tiny pulmonary nodules not delineated by plain film exam. Please see prior CT report. Electronically Signed   By: Lacy Duverney M.D.   On: 08/18/2015 12:16   I have personally reviewed and evaluated these images and lab results as part of my medical decision-making.   EKG Interpretation   Date/Time:  Tuesday August 18 2015 11:19:01 EDT Ventricular Rate:  85 PR Interval:    QRS Duration: 99 QT Interval:  389 QTC Calculation: 463 R Axis:   83 Text Interpretation:  Afib/flut and V-paced complexes No further rhythm  analysis attempted due to paced rhythm Borderline right axis deviation Low  voltage, precordial leads RSR' in V1 or V2, probably  normal variant  Minimal ST depression, anterolateral leads Confirmed by RAY MD, Duwayne Heck  [id number]) on 08/18/2015 12:29:39 PM      MDM   Final diagnoses:  Chest pain, unspecified chest pain type  Shortness of breath  Acute on chronic congestive heart failure, unspecified congestive heart failure type (HCC)  Elevated troponin    Initial labs with a troponin of 0, EKG in afib/flutter, no significant changes from prior. CXR with some pulmonary vascular congestion and a small left pleural effusion. Dose of lasix IV given. BP has been elevated from baseline and home dose of metoprolol 100mg  given. Plan had been to delta troponin and hopefully d/c home as she has cardiology follow up and echocardiogram scheduled for this week. However, repeat troponin is now elevated to 0.09. I will call the cardiology service for admission.  I spoke to , PA-C of the cardiology service who will see and admit pt. Appreciate assistance.    Ronie Spies, PA-C 08/18/15 1615  08/20/15, MD 08/20/15 [phone]

## 2015-08-18 NOTE — ED Notes (Signed)
Informed McKenzie-RN about pt's blood pressure. 

## 2015-08-18 NOTE — ED Notes (Signed)
New order for INR and BMP. Place because lab reported blood hemalysed.

## 2015-08-18 NOTE — Progress Notes (Signed)
NURSING PROGRESS NOTE  [name] [id number] Admission Data: 08/18/2015 10:20 PM Attending Provider: Wendall Stade, MD LKJ:[id number], MD Code Status: full   [name] is a 82 y.o. female patient admitted from ED:  -No acute distress noted.  -No complaints of shortness of breath.  -No complaints of chest pain.   Cardiac Monitoring: Box # #24 in place. Cardiac monitor yields:normal sinus rhythm.  Blood pressure 159/100, pulse 83, temperature 98 F (36.7 C), temperature source Oral, resp. rate 20, height 5\' 2"  (1.575 m), weight 87 kg (191 lb 12.8 oz), SpO2 96 %.   IV Fluids:  IV in place, occlusive dsg intact without redness, IV cath antecubital right, condition patent and no redness none.   Allergies:  Morphine and related  Past Medical History:   has a past medical history of Permanent atrial fibrillation (HCC); Hypertension; Hyperlipidemia; GERD (gastroesophageal reflux disease); Mild CAD; Osteoporosis; Pacemaker-St. Jude; Obesity; Chronic diastolic CHF (congestive heart failure) (HCC); Dyspnea; Balance problem; and Stroke (HCC) (07/2013).  Past Surgical History:   has past surgical history that includes Cardiac catheterization; Total abdominal hysterectomy w/ bilateral salpingoophorectomy (1974); Colectomy (1991); Splenectomy; Insert / replace / remove pacemaker; and Breast lumpectomy (Right).  Social History:   reports that she has never smoked. She does not have any smokeless tobacco history on file. She reports that she does not drink alcohol or use illicit drugs.  Skin: skin intact; clean and dry  Patient/Family orientated to room. Information packet given to patient/family. Admission inpatient armband information verified with patient/family to include name and date of birth and placed on patient arm. Side rails up x 2, fall assessment and education completed with patient/family. Patient/family able to verbalize understanding of risk associated with falls and  verbalized understanding to call for assistance before getting out of bed. Call light within reach. Patient/family able to voice and demonstrate understanding of unit orientation instructions.    Will continue to evaluate and treat per MD orders.

## 2015-08-19 ENCOUNTER — Observation Stay (HOSPITAL_BASED_OUTPATIENT_CLINIC_OR_DEPARTMENT_OTHER): Payer: Commercial Managed Care - HMO

## 2015-08-19 ENCOUNTER — Encounter (HOSPITAL_COMMUNITY): Payer: Self-pay | Admitting: General Practice

## 2015-08-19 DIAGNOSIS — I482 Chronic atrial fibrillation: Secondary | ICD-10-CM | POA: Diagnosis not present

## 2015-08-19 DIAGNOSIS — I509 Heart failure, unspecified: Secondary | ICD-10-CM

## 2015-08-19 DIAGNOSIS — I10 Essential (primary) hypertension: Secondary | ICD-10-CM | POA: Diagnosis not present

## 2015-08-19 DIAGNOSIS — I5033 Acute on chronic diastolic (congestive) heart failure: Secondary | ICD-10-CM | POA: Diagnosis not present

## 2015-08-19 DIAGNOSIS — R079 Chest pain, unspecified: Secondary | ICD-10-CM | POA: Diagnosis not present

## 2015-08-19 LAB — BASIC METABOLIC PANEL
ANION GAP: 12 (ref 5–15)
BUN: 10 mg/dL (ref 6–20)
CHLORIDE: 98 mmol/L — AB (ref 101–111)
CO2: 26 mmol/L (ref 22–32)
Calcium: 9.2 mg/dL (ref 8.9–10.3)
Creatinine, Ser: 1.08 mg/dL — ABNORMAL HIGH (ref 0.44–1.00)
GFR, EST AFRICAN AMERICAN: 51 mL/min — AB (ref >60)
GFR, EST NON AFRICAN AMERICAN: 44 mL/min — AB (ref >60)
Glucose, Bld: 124 mg/dL — ABNORMAL HIGH (ref 65–99)
POTASSIUM: 3.6 mmol/L (ref 3.5–5.1)
SODIUM: 136 mmol/L (ref 135–145)

## 2015-08-19 LAB — ECHOCARDIOGRAM COMPLETE
HEIGHTINCHES: 62 in
Weight: 3035.2 oz

## 2015-08-19 LAB — CBC
HCT: 47.4 % — ABNORMAL HIGH (ref 36.0–46.0)
HEMOGLOBIN: 15.6 g/dL — AB (ref 12.0–15.0)
MCH: 31.6 pg (ref 26.0–34.0)
MCHC: 32.9 g/dL (ref 30.0–36.0)
MCV: 96.1 fL (ref 78.0–100.0)
PLATELETS: 311 10*3/uL (ref 150–400)
RBC: 4.93 MIL/uL (ref 3.87–5.11)
RDW: 13.9 % (ref 11.5–15.5)
WBC: 9.5 10*3/uL (ref 4.0–10.5)

## 2015-08-19 LAB — PROTIME-INR
INR: 1.92 — AB (ref 0.00–1.49)
PROTHROMBIN TIME: 21.9 s — AB (ref 11.6–15.2)

## 2015-08-19 LAB — TROPONIN I

## 2015-08-19 MED ORDER — SODIUM CHLORIDE 0.9 % IV BOLUS (SEPSIS)
250.0000 mL | Freq: Once | INTRAVENOUS | Status: AC
  Administered 2015-08-19: 250 mL via INTRAVENOUS

## 2015-08-19 MED ORDER — WARFARIN SODIUM 3 MG PO TABS
3.0000 mg | ORAL_TABLET | Freq: Once | ORAL | Status: AC
  Administered 2015-08-19: 3 mg via ORAL
  Filled 2015-08-19: qty 1

## 2015-08-19 NOTE — Progress Notes (Signed)
Notified oncall MD Corotto of patient's recent status change; nausea and verbalizing not feeling well. 250 NS bolus ordered was given. Will continue to monitor for patient safety.

## 2015-08-19 NOTE — Care Management Obs Status (Signed)
MEDICARE OBSERVATION STATUS NOTIFICATION   Patient Details  Name: [name] MRN: [id number] Date of Birth: Dec 13, 1923   Medicare Observation Status Notification Given:  Yes    Elliot Cousin, RN 08/19/2015, 2:16 PM

## 2015-08-19 NOTE — Progress Notes (Signed)
  Echocardiogram 2D Echocardiogram has been performed.  [name] 08/19/2015, 9:43 AM

## 2015-08-19 NOTE — Progress Notes (Signed)
ANTICOAGULATION CONSULT NOTE -Follow up Pharmacy Consult for Warfarin Indication: atrial fibrillation  Allergies  Allergen Reactions  . Morphine And Related Other (See Comments)    hallucinations    Patient Measurements: Height: 5\' 2"  (157.5 cm) Weight: 189 lb 11.2 oz (86.047 kg) IBW/kg (Calculated) : 50.1  Vital Signs: Temp: 97.6 F (36.4 C) (07/19 1021) Temp Source: Oral (07/19 1021) BP: 90/59 mmHg (07/19 1021) Pulse Rate: 88 (07/19 1021)  Labs:  Recent Labs  08/18/15 1135 08/18/15 1322 08/18/15 2022 08/19/15 0203 08/19/15 0745  HGB 14.0  --   --  15.6*  --   HCT 41.9  --   --  47.4*  --   PLT 125*  --   --  311  --   LABPROT  --  22.9*  --  21.9*  --   INR  --  2.03*  --  1.92*  --   CREATININE  --  0.97  --  1.08*  --   TROPONINI  --   --  <0.03 <0.03 <0.03    Estimated Creatinine Clearance: 36 mL/min (by C-G formula based on Cr of 1.08).   Medical History: Past Medical History  Diagnosis Date  . Permanent atrial fibrillation (HCC)     a. c/b tachy-brady syndrome s/p PPM 2010.  2011 Hypertension   . Hyperlipidemia   . GERD (gastroesophageal reflux disease)   . Mild CAD     a. nonobst CAD by cath 09/2010.  . Osteoporosis   . Pacemaker-St. Jude   . Obesity   . Chronic diastolic CHF (congestive heart failure) (HCC)   . Dyspnea   . Balance problem   . Stroke (HCC) 07/2013  . Arthritis     oa    Medications:  Prescriptions prior to admission  Medication Sig Dispense Refill Last Dose  . calcium carbonate (TUMS) 500 MG chewable tablet Chew 1 tablet by mouth daily as needed for indigestion or heartburn.   Past Week at Unknown time  . carbidopa-levodopa (SINEMET CR) 50-200 MG tablet Take 1 tablet by mouth 2 (two) times daily.   08/17/2015 at Unknown time  . diltiazem (CARDIZEM CD) 240 MG 24 hr capsule Take 240 mg by mouth daily.    08/17/2015 at Unknown time  . furosemide (LASIX) 20 MG tablet Take 1 tablet (20 mg total) by mouth as needed. (Patient taking  differently: Take 20 mg by mouth daily as needed for fluid. ) 30 tablet 6 Past Week at Unknown time  . isosorbide mononitrate (IMDUR) 30 MG 24 hr tablet TAKE 1 TABLET ONCE DAILY. 90 tablet 3 08/17/2015 at Unknown time  . metoprolol (LOPRESSOR) 100 MG tablet Take 1 tablet (100 mg total) by mouth 2 (two) times daily. 60 tablet 6 08/17/2015 at 7pm  . omeprazole (PRILOSEC) 20 MG capsule Take 20 mg by mouth daily.     08/17/2015 at Unknown time  . pravastatin (PRAVACHOL) 40 MG tablet Take 1 tablet (40 mg total) by mouth daily. 30 tablet 2 08/17/2015 at Unknown time  . rOPINIRole (REQUIP) 0.5 MG tablet Take 0.5 mg by mouth at bedtime.   08/17/2015 at Unknown time  . warfarin (COUMADIN) 3 MG tablet Take 3 mg by mouth daily.    08/17/2015 at 7pm  . meclizine (ANTIVERT) 25 MG tablet Take 25 mg by mouth as needed for dizziness.    prn   Scheduled:  Infusions:   Assessment: 82yo female with history of Afib on warfarin PTA presents with CP. Pharmacy was consulted  on admission 08/18/15 to continue warfarin for atrial fibrillation.  PTA Warfarin Dose:  3mg /d with last dose 08/17/15 INR 2.03>1.92 today. CBC wnl, pltc 311K. No bleeding noted. Complains of dyspnea. No chest pain   Goal of Therapy:  INR 2-3 Monitor platelets by anticoagulation protocol: Yes   Plan:  Warfarin 3mg  tonight x1 Daily INR Monitor s/sx of bleeding  08/19/15, RPh Clinical Pharmacist Pager: [phone] 08/19/2015,2:33 PM

## 2015-08-19 NOTE — Progress Notes (Signed)
Patient Name: [name] Date of Encounter: 08/19/2015   SUBJECTIVE  Complains of dyspnea. No chest pain.   CURRENT MEDS . aspirin EC  81 mg Oral Daily  . carbidopa-levodopa  1 tablet Oral BID  . diltiazem  240 mg Oral Daily  . isosorbide mononitrate  30 mg Oral Daily  . metoprolol  100 mg Oral BID  . pantoprazole  40 mg Oral Daily  . pravastatin  40 mg Oral Daily  . rOPINIRole  0.5 mg Oral QHS  . sodium chloride flush  3 mL Intravenous Q12H  . Warfarin - Pharmacist Dosing Inpatient   Does not apply q1800    OBJECTIVE  Filed Vitals:   08/19/15 0122 08/19/15 0218 08/19/15 0514 08/19/15 1021  BP: 94/70 89/44 86/51  90/59  Pulse: 57  58 88  Temp:   98.7 F (37.1 C) 97.6 F (36.4 C)  TempSrc:   Oral Oral  Resp: 20  20 18   Height:      Weight:   189 lb 11.2 oz (86.047 kg)   SpO2: 95%  91% 94%    Intake/Output Summary (Last 24 hours) at 08/19/15 1117 Last data filed at 08/19/15 0241  Gross per 24 hour  Intake    370 ml  Output   2550 ml  Net  -2180 ml   Filed Weights   08/18/15 2033 08/19/15 0514  Weight: 191 lb 12.8 oz (87 kg) 189 lb 11.2 oz (86.047 kg)    PHYSICAL EXAM  General: Pleasant, NAD. Neuro: Alert and oriented X 3. Moves all extremities spontaneously. Psych: Normal affect. HEENT:  Normal  Neck: Supple without bruits or JVD. Lungs:  Resp regular and unlabored, CTA. Heart: RRR no s3, s4, or murmurs. Abdomen: Soft, non-tender, non-distended, BS + x 4.  Extremities: No clubbing, cyanosis. Trace BL LE  edema. DP/PT/Radials 2+ and equal bilaterally.  Accessory Clinical Findings  CBC  Recent Labs  08/18/15 1135 08/19/15 0203  WBC 7.4 9.5  HGB 14.0 15.6*  HCT 41.9 47.4*  MCV 94.8 96.1  PLT 125* 311   Basic Metabolic Panel  Recent Labs  08/18/15 1322 08/19/15 0203  NA 139 136  K 4.6 3.6  CL 106 98*  CO2 27 26  GLUCOSE 99 124*  BUN 7 10  CREATININE 0.97 1.08*  CALCIUM 9.2 9.2   Liver Function Tests No results for input(s):  AST, ALT, ALKPHOS, BILITOT, PROT, ALBUMIN in the last 72 hours. No results for input(s): LIPASE, AMYLASE in the last 72 hours. Cardiac Enzymes  Recent Labs  08/18/15 2022 08/19/15 0203 08/19/15 0745  TROPONINI <0.03 <0.03 <0.03   BNP Invalid input(s): POCBNP D-Dimer No results for input(s): DDIMER in the last 72 hours. Hemoglobin A1C No results for input(s): HGBA1C in the last 72 hours. Fasting Lipid Panel No results for input(s): CHOL, HDL, LDLCALC, TRIG, CHOLHDL, LDLDIRECT in the last 72 hours. Thyroid Function Tests  Recent Labs  08/18/15 2034  TSH 4.568*    TELE  afib and paced rhythm intermittently. Rate in 90s to 100s  Radiology/Studies  Dg Chest 2 View  08/18/2015  CLINICAL DATA:  82 year old female with left-sided chest pain extending into left arm for the past day. Shortness breath. Hypertension. Nonsmoker. Initial encounter. EXAM: CHEST  2 VIEW COMPARISON:  11/02/2014 and 06/20/2014 chest x-ray. 03/21/2014 chest CT. FINDINGS: Sequential pacer in place with leads unchanged in position. Cardiomegaly. Calcified tortuous aorta. Pulmonary vascular prominence. Small left pleural effusion. Right base subsegmental atelectasis. No pneumothorax. Large hiatal  hernia. CT detected tiny pulmonary nodules not delineated by plain film exam. Please see prior CT report. Thoracic kyphosis similar to prior exam. IMPRESSION: Sequential pacer in place.  Cardiomegaly. Calcified tortuous aorta.  Aortic atherosclerosis. Pulmonary vascular congestion with small left pleural effusion. Right base subsegmental atelectasis. Large hiatal hernia. CT detected tiny pulmonary nodules not delineated by plain film exam. Please see prior CT report. Electronically Signed   By: Lacy Duverney M.D.   On: 08/18/2015 12:16    ASSESSMENT AND PLAN   1. Acute on chronic diastolic CHF  - BNP of 582. Given IV Lasix 20mg  x 2 with total diuresis of 2.1L and 2lb weight loss. Was hypotensive last night and given bolus  of 250cc fluid.  - Pending echo reading.   2. Chest pressure  - Difficult to know if related to CHF versus underlying CAD.  Troponin x 3 negative. Likely outpatient stress test.   3. Accelerated HTN  - BP of 174/111 up on presentatio.  Resumption of BP meds-- > hypotensive. Now held.   4. Chronic afib  - -Rate in 90-100s.  Continue Coumadin. Have St Jude pacemaker. Telemetry showed afib with intermittent pacing. Per Dr. note 07/28/15 add digoxin for LV dysfunction. IF unable to controled rate --> AV ablation.   Signed, 07/30/15 PA-C Pager [phone]  Patient seen and exaimned  I have reviewed hosp course.  Spoken to [phone] (pt's granddaughter who works at [employer])  She says that pt is not taking alll the meds that have been listed (esp metoprolol and pravastatin)  She wonders if this is why her BP dropped The pt complains of some swimmy sensation in head  Still mildly SOB   ON exam:  BP 90s/  Lungs Rel clear  Cardiac RRR  No S3  Ext with Tr edema  Will stop BP meds  Follow Hold further lasix for now    [facility]

## 2015-08-19 NOTE — Plan of Care (Signed)
Called by the bedside RN who reported that [name] was experiencing mild nausea and generally feeling unwell.  Her SBP=90 after coming in with pressures in the 150-160 range.  She has put out over of urine since arrival to the floor, and also received her diltiazem, imdur, and metoprolol.  At this point it appears as though the pt requires some volume back, so we will start with a bolus of NS and then given an additional if needed.  Azalee Course MD

## 2015-08-19 NOTE — Care Management Note (Addendum)
Case Management Note  Patient Details  Name: [name] MRN: [id number] Date of Birth: 25-Jul-1923  Subjective/Objective:    CHF, dyspnea                Action/Plan: Discharge Planning:  NCM spoke to pt and states she lives at home with grand-dtr, [name]. Pt states she is independent at home. Does not have any DME at home. Pt may benefit for having HH RN for CHF Disease Management. Will do [facility] referral for EMMI program. Offered choice/HH list provided. Pt agreeable to [facility] for HH. Will need orders for [facility] RN /F2F. Contacted [facility] with new referral.    PCP Charlott Rakes MD  Expected Discharge Date:                Expected Discharge Plan:  Home/Self Care  In-House Referral:  NA  Discharge planning Services  CM Consult  Post Acute Care Choice:  NA Choice offered to:  NA  DME Arranged:  N/A DME Agency:  NA  HH Arranged:   HH Agency:    Status of Service:  Completed, signed off  If discussed at Long Length of Stay Meetings, dates discussed:    Additional Comments:  Elliot Cousin, RN 08/19/2015, 2:17 PM

## 2015-08-20 ENCOUNTER — Encounter: Payer: Self-pay | Admitting: Internal Medicine

## 2015-08-20 DIAGNOSIS — I5033 Acute on chronic diastolic (congestive) heart failure: Secondary | ICD-10-CM | POA: Diagnosis not present

## 2015-08-20 DIAGNOSIS — R079 Chest pain, unspecified: Secondary | ICD-10-CM | POA: Diagnosis present

## 2015-08-20 DIAGNOSIS — I10 Essential (primary) hypertension: Secondary | ICD-10-CM | POA: Diagnosis not present

## 2015-08-20 DIAGNOSIS — I509 Heart failure, unspecified: Secondary | ICD-10-CM | POA: Diagnosis not present

## 2015-08-20 DIAGNOSIS — I482 Chronic atrial fibrillation: Secondary | ICD-10-CM | POA: Diagnosis not present

## 2015-08-20 LAB — BASIC METABOLIC PANEL
ANION GAP: 9 (ref 5–15)
Anion gap: 8 (ref 5–15)
BUN: 16 mg/dL (ref 6–20)
BUN: 16 mg/dL (ref 6–20)
CALCIUM: 9.1 mg/dL (ref 8.9–10.3)
CALCIUM: 8.9 mg/dL (ref 8.9–10.3)
CHLORIDE: 101 mmol/L (ref 101–111)
CO2: 28 mmol/L (ref 22–32)
CO2: 26 mmol/L (ref 22–32)
CREATININE: 1.29 mg/dL — AB (ref 0.44–1.00)
CREATININE: 1.2 mg/dL — AB (ref 0.44–1.00)
Chloride: 101 mmol/L (ref 101–111)
GFR calc non Af Amer: 36 mL/min — ABNORMAL LOW (ref >60)
GFR calc non Af Amer: 39 mL/min — ABNORMAL LOW (ref >60)
GFR, EST AFRICAN AMERICAN: 41 mL/min — AB (ref >60)
GFR, EST AFRICAN AMERICAN: 45 mL/min — AB (ref >60)
Glucose, Bld: 101 mg/dL — ABNORMAL HIGH (ref 65–99)
Glucose, Bld: 103 mg/dL — ABNORMAL HIGH (ref 65–99)
Potassium: 3.7 mmol/L (ref 3.5–5.1)
Potassium: 3.8 mmol/L (ref 3.5–5.1)
SODIUM: 138 mmol/L (ref 135–145)
SODIUM: 135 mmol/L (ref 135–145)

## 2015-08-20 LAB — CBC
HCT: 46.5 % — ABNORMAL HIGH (ref 36.0–46.0)
HEMOGLOBIN: 15.1 g/dL — AB (ref 12.0–15.0)
MCH: 31.5 pg (ref 26.0–34.0)
MCHC: 32.5 g/dL (ref 30.0–36.0)
MCV: 96.9 fL (ref 78.0–100.0)
Platelets: 287 10*3/uL (ref 150–400)
RBC: 4.8 MIL/uL (ref 3.87–5.11)
RDW: 14.1 % (ref 11.5–15.5)
WBC: 9.5 10*3/uL (ref 4.0–10.5)

## 2015-08-20 LAB — BRAIN NATRIURETIC PEPTIDE: B NATRIURETIC PEPTIDE 5: 86.2 pg/mL (ref 0.0–100.0)

## 2015-08-20 LAB — PROTIME-INR
INR: 1.92 — AB (ref 0.00–1.49)
PROTHROMBIN TIME: 21.9 s — AB (ref 11.6–15.2)

## 2015-08-20 MED ORDER — WARFARIN SODIUM 2 MG PO TABS
4.0000 mg | ORAL_TABLET | Freq: Once | ORAL | Status: AC
  Administered 2015-08-20: 4 mg via ORAL
  Filled 2015-08-20: qty 2

## 2015-08-20 NOTE — Progress Notes (Signed)
ANTICOAGULATION CONSULT NOTE -Follow up Pharmacy Consult for Warfarin Indication: atrial fibrillation  Allergies  Allergen Reactions  . Lipitor [Atorvastatin] Other (See Comments)    Myalgias (noted in neurologist note on 08/23/2013 when admitted for acute CVA).  08/20/15:  Pravastatin started at that time. However, patient is not taking Pravastatin/told okay to stop it by PCP. [facility] said it was last filled in 07/2013)   . Morphine And Related Other (See Comments)    hallucinations    Patient Measurements: Height: 5\' 2"  (157.5 cm) Weight: 191 lb 4.8 oz (86.773 kg) IBW/kg (Calculated) : 50.1  Vital Signs: Temp: 98.3 F (36.8 C) (07/20 1218) Temp Source: Oral (07/20 1218) BP: 114/66 mmHg (07/20 1218) Pulse Rate: 68 (07/20 1218)  Labs:  Recent Labs  08/18/15 1135  08/18/15 1322 08/18/15 2022 08/19/15 0203 08/19/15 0745 08/20/15 0357 08/20/15 1302  HGB 14.0  --   --   --  15.6*  --  15.1*  --   HCT 41.9  --   --   --  47.4*  --  46.5*  --   PLT 125*  --   --   --  311  --  287  --   LABPROT  --   --  22.9*  --  21.9*  --  21.9*  --   INR  --   --  2.03*  --  1.92*  --  1.92*  --   CREATININE  --   < > 0.97  --  1.08*  --  1.29* 1.20*  TROPONINI  --   --   --  <0.03 <0.03 <0.03  --   --   < > = values in this interval not displayed.  Estimated Creatinine Clearance: 32.5 mL/min (by C-G formula based on Cr of 1.2).   Medical History: Past Medical History  Diagnosis Date  . Permanent atrial fibrillation (HCC)     a. c/b tachy-brady syndrome s/p PPM 2010.  2011 Hypertension   . Hyperlipidemia   . GERD (gastroesophageal reflux disease)   . Mild CAD     a. nonobst CAD by cath 09/2010.  . Osteoporosis   . Pacemaker-St. Jude   . Obesity   . Chronic diastolic CHF (congestive heart failure) (HCC)   . Dyspnea   . Balance problem   . Stroke (HCC) 07/2013  . Arthritis     oa    Medications:  Prescriptions prior to admission  Medication Sig Dispense Refill Last  Dose  . calcium carbonate (TUMS) 500 MG chewable tablet Chew 1 tablet by mouth daily as needed for indigestion or heartburn.   Past Week at Unknown time  . carbidopa-levodopa (SINEMET CR) 50-200 MG tablet Take 1 tablet by mouth 2 (two) times daily.   08/17/2015 at Unknown time  . diltiazem (CARDIZEM CD) 240 MG 24 hr capsule Take 240 mg by mouth daily.    08/17/2015 at Unknown time  . furosemide (LASIX) 20 MG tablet Take 1 tablet (20 mg total) by mouth as needed. (Patient taking differently: Take 20 mg by mouth daily as needed for fluid. ) 30 tablet 6 Past Week at Unknown time  . isosorbide mononitrate (IMDUR) 30 MG 24 hr tablet TAKE 1 TABLET ONCE DAILY. 90 tablet 3 08/17/2015 at Unknown time  . metoprolol (LOPRESSOR) 100 MG tablet Take 1 tablet (100 mg total) by mouth 2 (two) times daily. 60 tablet 6 08/17/2015 at 7pm  . omeprazole (PRILOSEC) 20 MG capsule Take 20 mg by mouth  daily.     08/17/2015 at Unknown time  . rOPINIRole (REQUIP) 0.5 MG tablet Take 0.5 mg by mouth at bedtime.   08/17/2015 at Unknown time  . warfarin (COUMADIN) 3 MG tablet Take 3 mg by mouth daily.    08/17/2015 at 7pm  . meclizine (ANTIVERT) 25 MG tablet Take 25 mg by mouth as needed for dizziness.    prn  . pravastatin (PRAVACHOL) 40 MG tablet Take 1 tablet (40 mg total) by mouth daily. (Patient not taking: Reported on 08/20/2015) 30 tablet 2 Not Taking at Unknown time    Assessment: 82yo female with history of Afib on warfarin PTA presents with CP. Pharmacy was consulted on admission 08/18/15 to continue warfarin for atrial fibrillation.   PTA Warfarin Dose:  3mg  daily,  last dose PTA taken7/17/17 INR 2.03>1.92 on 7/19> and remains 1.92 today 08/20/15.  CBC is stable/ wnl, pltc wnl at 287K. No bleeding noted. No chest pain noted today and cardiologist notes patient's breathing is a little better, no dizziness.    Goal of Therapy:  INR 2-3 Monitor platelets by anticoagulation protocol: Yes   Plan:  Warfarin 4 mg tonight  x1 Daily INR Monitor s/sx of bleeding   08/22/15, RPh Clinical Pharmacist Pager: [phone] 08/20/2015,4:07 PM

## 2015-08-20 NOTE — Consult Note (Signed)
   [facility] CM Inpatient Consult   08/20/2015  [name] 1923/03/10 [id number]  Referral received to assess for care management services. Chart review reveals the patient is an 82 y/o F with history of permanent atrial fib, syncope with tachybrady syndrome, chronic dyspnea, h/o bradycardia (bisoprolol reduced), HTN, GERD, PACs by event monitor, nonobstructive CAD by cath in 09/2010, balance issues, stroke 2015 who presented with complaints of CP and SOB. Per her report, "I should probably take my medicines like the doctors tell me to" - she has not been faithful with her PRN diuretic or BP meds. She does not like how the fluid pill makes her incontinent despite wearing depends per MD notes.   Met with the patient regarding the benefits of [facility] services.  Patient has been assigned the [facility] EMMI HF calls by inpatient RNCM.  Patient had expressed not taking her medications always like she should per MD notes.  Explained that [facility] is a covered benefit of insurance. Patient states she feels like she does not need a nurse at this time. "I will be 89 next month and I don't even have to use anything to get around. I will do better. [smiling]"   Review information for [facility] and a folder was provided with contact information.  Explained that [facility] does not interfere with or replace any services arranged by the inpatient care management staff.  Patient declined services with [facility].  For questions, please contact:  Natividad Brood, RN BSN [facility] Liaison  [phone] business mobile phone Toll free office [phone]

## 2015-08-20 NOTE — Progress Notes (Signed)
Subjective: No CP  Breathing is a little better  No dizziness   Objective: Filed Vitals:   08/19/15 0514 08/19/15 1021 08/19/15 2037 08/20/15 0514  BP: 86/51 90/59 109/67 111/64  Pulse: 58 88 60 56  Temp: 98.7 F (37.1 C) 97.6 F (36.4 C) 98.6 F (37 C) 98.5 F (36.9 C)  TempSrc: Oral Oral Oral Oral  Resp: 20 18 18 18   Height:      Weight: 189 lb 11.2 oz (86.047 kg)   191 lb 4.8 oz (86.773 kg)  SpO2: 91% 94% 95% 93%   Weight change: -8 oz (-0.227 kg)  Intake/Output Summary (Last 24 hours) at 08/20/15 0846 Last data filed at 08/19/15 2100  Gross per 24 hour  Intake    580 ml  Output    150 ml  Net    430 ml   Net neg 1.5 L    General: Alert, awake, oriented x3, in no acute distress Neck:  JVP is normal Heart: Regular rate and rhythm, without murmurs, rubs, gallops.  Lungs: Clear to auscultation.  Mild rale at L base  Upper airway wheeze  Exemities:  No edema.   Neuro: Grossly intact, nonfocal.  TEle:  Afib 60s   Lab Results: Results for orders placed or performed during the hospital encounter of 08/18/15 (from the past 24 hour(s))  Basic metabolic panel     Status: Abnormal   Collection Time: 08/20/15  3:57 AM  Result Value Ref Range   Sodium 138 135 - 145 mmol/L   Potassium 3.7 3.5 - 5.1 mmol/L   Chloride 101 101 - 111 mmol/L   CO2 28 22 - 32 mmol/L   Glucose, Bld 101 (H) 65 - 99 mg/dL   BUN 16 6 - 20 mg/dL   Creatinine, Ser 08/22/15 (H) 0.44 - 1.00 mg/dL   Calcium 9.1 8.9 - 3.78 mg/dL   GFR calc non Af Amer 36 (L) >60 mL/min   GFR calc Af Amer 41 (L) >60 mL/min   Anion gap 9 5 - 15  Protime-INR     Status: Abnormal   Collection Time: 08/20/15  3:57 AM  Result Value Ref Range   Prothrombin Time 21.9 (H) 11.6 - 15.2 seconds   INR 1.92 (H) 0.00 - 1.49  CBC     Status: Abnormal   Collection Time: 08/20/15  3:57 AM  Result Value Ref Range   WBC 9.5 4.0 - 10.5 K/uL   RBC 4.80 3.87 - 5.11 MIL/uL   Hemoglobin 15.1 (H) 12.0 - 15.0 g/dL   HCT 08/22/15 (H) 50.2 -  46.0 %   MCV 96.9 78.0 - 100.0 fL   MCH 31.5 26.0 - 34.0 pg   MCHC 32.5 30.0 - 36.0 g/dL   RDW 77.4 12.8 - 78.6 %   Platelets 287 150 - 400 K/uL    Studies/Results: No results found.  MedicationsREviewed  @PROBHOSP @  1  Acute on chronic diastolic CHF  I would like to  give lasix x 1 today but Cr bumped (prob from hypotension yesterday )  Will repeat lated    2  HTN  BP is better  I think she does not take all her meds at home and she got more on day of admit than she usually takes  This is a problem  I have spoken with granddaughter who works in cath lab    3  Chronic atrial fib  COntinue anticoag  Rates OK  Off Of meds!!  Dietrich Pates 08/20/2015, 8:46 AM

## 2015-08-21 ENCOUNTER — Other Ambulatory Visit: Payer: Commercial Managed Care - HMO

## 2015-08-21 ENCOUNTER — Encounter (HOSPITAL_COMMUNITY): Payer: Self-pay | Admitting: Cardiology

## 2015-08-21 DIAGNOSIS — R079 Chest pain, unspecified: Secondary | ICD-10-CM | POA: Diagnosis not present

## 2015-08-21 DIAGNOSIS — Z5181 Encounter for therapeutic drug level monitoring: Secondary | ICD-10-CM

## 2015-08-21 DIAGNOSIS — I509 Heart failure, unspecified: Secondary | ICD-10-CM | POA: Diagnosis not present

## 2015-08-21 DIAGNOSIS — I10 Essential (primary) hypertension: Secondary | ICD-10-CM | POA: Diagnosis not present

## 2015-08-21 DIAGNOSIS — Z7901 Long term (current) use of anticoagulants: Secondary | ICD-10-CM

## 2015-08-21 DIAGNOSIS — I5033 Acute on chronic diastolic (congestive) heart failure: Secondary | ICD-10-CM | POA: Diagnosis not present

## 2015-08-21 DIAGNOSIS — I482 Chronic atrial fibrillation: Secondary | ICD-10-CM | POA: Diagnosis not present

## 2015-08-21 DIAGNOSIS — I959 Hypotension, unspecified: Secondary | ICD-10-CM

## 2015-08-21 HISTORY — DX: Hypotension, unspecified: I95.9

## 2015-08-21 HISTORY — DX: Encounter for therapeutic drug level monitoring: Z51.81

## 2015-08-21 HISTORY — DX: Long term (current) use of anticoagulants: Z79.01

## 2015-08-21 LAB — BASIC METABOLIC PANEL
ANION GAP: 10 (ref 5–15)
BUN: 13 mg/dL (ref 6–20)
CO2: 26 mmol/L (ref 22–32)
Calcium: 8.9 mg/dL (ref 8.9–10.3)
Chloride: 102 mmol/L (ref 101–111)
Creatinine, Ser: 1.15 mg/dL — ABNORMAL HIGH (ref 0.44–1.00)
GFR calc Af Amer: 47 mL/min — ABNORMAL LOW (ref >60)
GFR, EST NON AFRICAN AMERICAN: 41 mL/min — AB (ref >60)
GLUCOSE: 99 mg/dL (ref 65–99)
POTASSIUM: 3.4 mmol/L — AB (ref 3.5–5.1)
SODIUM: 138 mmol/L (ref 135–145)

## 2015-08-21 LAB — CBC
HCT: 44.5 % (ref 36.0–46.0)
Hemoglobin: 14.6 g/dL (ref 12.0–15.0)
MCH: 31.7 pg (ref 26.0–34.0)
MCHC: 32.8 g/dL (ref 30.0–36.0)
MCV: 96.7 fL (ref 78.0–100.0)
PLATELETS: 295 10*3/uL (ref 150–400)
RBC: 4.6 MIL/uL (ref 3.87–5.11)
RDW: 14.1 % (ref 11.5–15.5)
WBC: 8.9 10*3/uL (ref 4.0–10.5)

## 2015-08-21 LAB — PROTIME-INR
INR: 2.2 — ABNORMAL HIGH (ref 0.00–1.49)
PROTHROMBIN TIME: 24.3 s — AB (ref 11.6–15.2)

## 2015-08-21 NOTE — Progress Notes (Signed)
Pt has orders to be discharged. Discharge instructions given and pt has no additional questions at this time. Medication regimen reviewed and pt educated. Pt verbalized understanding and has no additional questions. Telemetry box removed. IV removed and site in good condition. Pt stable and waiting for transportation.   Dravon Nott RN 

## 2015-08-21 NOTE — Discharge Instructions (Signed)
Weigh daily Call [phone] if weight climbs more than 3 pounds in a day or 5 pounds in a week. No salt to very little salt in your diet.  No more than 2000 mg in a day. Call if increased shortness of breath or increased swelling.  Check BP daily and if greater than 140 systolic for 2-3 times please call the office.    We cancelled the Echo of your heart in [place] for today.

## 2015-08-21 NOTE — Discharge Summary (Signed)
Physician Discharge Summary       Patient ID: [name] MRN: [id number] DOB/AGE: 1923-03-12 82 y.o.  Admit date: 08/18/2015 Discharge date: 08/21/2015 Primary Cardiologist: Dr. Gala Romney EP : Dr. Graciela Husbands  Discharge Diagnoses:  Principal Problem:   Acute on chronic congestive heart failure [facility]) Active Problems:   Hypotensive episode   Essential hypertension   Chronic atrial fibrillation (HCC)   Chronic diastolic heart failure (HCC)   Cardiac pacemaker-St. Jude-vvi   Shortness of breath   Acute on chronic diastolic (congestive) heart failure (HCC)   Accelerated hypertension   Elevated troponin- for one draw- may be spurious   Pain in the chest   Anticoagulation goal of INR 2 to 3   Discharged Condition: good  Procedures:  08/19/15  ECHO Study Conclusions  - Left ventricle: The cavity size was normal. Wall thickness was  normal. Systolic function was normal. The estimated ejection  fraction was in the range of 55% to 60%. Images were inadequate  for LV wall motion assessment. The study is not technically  sufficient to allow evaluation of LV diastolic function. - Aortic valve: Sclerosis without stenosis. There was trivial  regurgitation. - Mitral valve: Mildly thickened leaflets . There was mild  regurgitation. - Left atrium: Moderately dilated. - Right ventricle: Poorly visualized. - Right atrium: Moderately dilated. - Tricuspid valve: There was mild regurgitation. - Pulmonary arteries: PA peak pressure: 39 mm Hg (S). - Inferior vena cava: The vessel was dilated. The respirophasic  diameter changes were blunted (< 50%), consistent with elevated  central venous pressure.  Impressions:  - Compared to a prior echo in 07/2013, there are no significant  changes.  Hospital Course:   82 y/o F with history of permanent atrial fib, syncope with tachybrady syndrome s/p PPM 2010, chronic dyspnea, h/o bradycardia (bisoprolol reduced), HTN, GERD, PACs by event  monitor, nonobstructive CAD by cath in 09/2010 (neg nuc 2011), balance issues, stroke 2015 who presented to [facility] with complaints of CP and SOB. Per her report, "I should probably take my medicines like the doctors tell me to" - she has not been faithful with her PRN diuretic or BP meds. She does not like how the fluid pill makes her incontinent despite wearing depends. The night before admit she felt "smothering" all night - had to sit up on the side of the bed due to dyspnea.  No better by AM and she noticed a sensation of chest pain, sharp, going through to her back very briefly which then settled into a dull chest pressure which has been constant. This improved with SL NTG. She can still feel an overall sensation of fullness. She feels like she has retained fluid. Doesn't drink much liquid per day. Doesn't weigh regularly due to balance issues.    In the ED she is noted to be hypertensive up to 170/124. She has noticed subjective improvement in dyspnea with 2L O2. She was also given 100mg  oral Lopressor and 20mg  IV Lasix. Labs notable for Cr 0.97, BNP 582, troponin 0.01->0.9, Hgb 14.0, INR 2.03. CXR: pulm vasc congestion, small left pleural effusion, right base atx.  She was admitted and with no EKG changes no ischemic work up was done.  All but one tropnin was negative.  May have been spurious troponin.  Echo was done with results as above.  We continued her coumadin for her atrial fib.     During the early morning hours after admit her BP dropped to 90 systolic.  She had diuresed >  2500 ml of urine so she was given IV fluids.   Her po BP meds were stopped due to continued low BP and her lasix was held.    These meds were held the 19, 20, and 21st and BP was controlled and her permanent atrial fib rates were controlled. Dr. Tenny Craw saw on day of discharge and found stable to discharge and we will continue to hold the dilt, lasix, imdur, metoprolol ad pravastatin- some she was not taking anyway.  The family  will monitor her closely and call if BP or HR increase.  She has follow up with Dr. Graciela Husbands and Dr. Gala Romney and she will call her PCP to arrange her coumadin check.  Therapeutic at discharge.   On visit to Dr. Graciela Husbands will need to decide to resume any meds and or if stress test is needed. At discharge I&O is neg 1290,  Wt 191.11 lbs    Consults: None  Significant Diagnostic Studies:  BMP Latest Ref Rng 08/21/2015 08/20/2015 08/20/2015  Glucose 65 - 99 mg/dL 99 016(W) 109(N)  BUN 6 - 20 mg/dL 13 16 16   Creatinine 0.44 - 1.00 mg/dL ) 2.35(T) 7.32(K)  Sodium 135 - 145 mmol/L 138 135 138  Potassium 3.5 - 5.1 mmol/L 3.4(L) 3.8 3.7  Chloride 101 - 111 mmol/L 102 101 101  CO2 22 - 32 mmol/L 26 26 28   Calcium 8.9 - 10.3 mg/dL 8.9 8.9 9.1   CBC Latest Ref Rng 08/21/2015 08/20/2015 08/19/2015  WBC 4.0 - 10.5 K/uL 8.9 9.5 9.5  Hemoglobin 12.0 - 15.0 g/dL 08/22/2015 15.1(H) 15.6(H)  Hematocrit 36.0 - 46.0 % 44.5 46.5(H) 47.4(H)  Platelets 150 - 400 K/uL 295 287 311   Troponin  0.01-->0.09--> <0.03--> <0.03--> <0.03   INR at discharge 2.20 TSH 4.568 BNP 582 on admit  CHEST 2 VIEW  COMPARISON: 11/02/2014 and 06/20/2014 chest x-ray. 03/21/2014 chest CT.  FINDINGS: Sequential pacer in place with leads unchanged in position. Cardiomegaly.  Calcified tortuous aorta.  Pulmonary vascular prominence. Small left pleural effusion. Right base subsegmental atelectasis. No pneumothorax.  Large hiatal hernia.  CT detected tiny pulmonary nodules not delineated by plain film exam. Please see prior CT report.  Thoracic kyphosis similar to prior exam.  IMPRESSION: Sequential pacer in place. Cardiomegaly.  Calcified tortuous aorta. Aortic atherosclerosis.  Pulmonary vascular congestion with small left pleural effusion.  Right base subsegmental atelectasis.  Large hiatal hernia.  CT detected tiny pulmonary nodules not delineated by plain film exam. Please see prior CT  report.   Discharge Exam: Blood pressure 123/66, pulse 84, temperature 98.4 F (36.9 C), temperature source Oral, resp. rate 16, height 5\' 2"  (1.575 m), weight 191 lb 11.2 oz (86.955 kg), SpO2 93 %.  Disposition: 01-Home or Self Care     Medication List    STOP taking these medications        diltiazem 240 MG 24 hr capsule  Commonly known as:  CARDIZEM CD     furosemide 20 MG tablet  Commonly known as:  LASIX     isosorbide mononitrate 30 MG 24 hr tablet  Commonly known as:  IMDUR     meclizine 25 MG tablet  Commonly known as:  ANTIVERT     metoprolol 100 MG tablet  Commonly known as:  LOPRESSOR     pravastatin 40 MG tablet  Commonly known as:  PRAVACHOL      TAKE these medications        carbidopa-levodopa 50-200 MG tablet  Commonly known  as:  SINEMET CR  Take 1 tablet by mouth 2 (two) times daily.     omeprazole 20 MG capsule  Commonly known as:  PRILOSEC  Take 20 mg by mouth daily.     rOPINIRole 0.5 MG tablet  Commonly known as:  REQUIP  Take 0.5 mg by mouth at bedtime.     TUMS 500 MG chewable tablet  Generic drug:  calcium carbonate  Chew 1 tablet by mouth daily as needed for indigestion or heartburn.     warfarin 3 MG tablet  Commonly known as:  COUMADIN  Take 3 mg by mouth daily.       Follow-up Information    Follow up with [facility].   Why:  Home Health RN   Contact information:   [address] [place] [address] [phone]       Follow up with Arvilla Meres, MD On 09/09/2015.   Specialty:  Cardiology   Why:  at 2: 40 pm.     Contact information:   [address] [place] [address] [phone]       Follow up with Sherryl Manges, MD On 09/01/2015.   Specialty:  Cardiology   Why:  at 9:15 AM.     Contact information:   [address] [place] [address] [phone]       Follow up with HODGES,FRANCISCO, MD.   Specialty:  Family Medicine   Why:   call his office today and make appt for coumadin check next week.       Discharge Instructions: Weigh daily Call [phone] if weight climbs more than 3 pounds in a day or 5 pounds in a week. No salt to very little salt in your diet.  No more than 2000 mg in a day. Call if increased shortness of breath or increased swelling.  Check BP daily and if greater than 140 systolic for 2-3 times please call the office.    We cancelled the Echo of your heart in [place] for today.      Signed: Nada Boozer Nurse Practitioner-Certified [facility]: Ulyses Southward 08/21/2015, 12:27 PM  Time spent on discharge : > 30 minutes.

## 2015-08-21 NOTE — Progress Notes (Signed)
Subjective: Breathing is good  No CP  No dizzienss  Objective: Filed Vitals:   08/20/15 0514 08/20/15 1218 08/20/15 2324 08/21/15 0455  BP: 111/64 114/66 106/53 126/67  Pulse: 56 68 71 60  Temp: 98.5 F (36.9 C) 98.3 F (36.8 C) 98.2 F (36.8 C) 98.1 F (36.7 C)  TempSrc: Oral Oral Oral Oral  Resp: 18 18 18 18   Height:      Weight: 191 lb 4.8 oz (86.773 kg)   191 lb 11.2 oz (86.955 kg)  SpO2: 93% 93% 93%    Weight change: 6.4 oz (0.181 kg)  Intake/Output Summary (Last 24 hours) at 08/21/15 0901 Last data filed at 08/21/15 0826  Gross per 24 hour  Intake    800 ml  Output    400 ml  Net    400 ml    General: Alert, awake, oriented x3, in no acute distress Neck:  JVP is normal Heart: Regular rate and rhythm, without murmurs, rubs, gallops.  Lungs: Clear to auscultation.  No rales or wheezes. Exemities:  No edema.   Neuro: Grossly intact, nonfocal.  Tele:  afib Lab Results: Results for orders placed or performed during the hospital encounter of 08/18/15 (from the past 24 hour(s))  Basic metabolic panel     Status: Abnormal   Collection Time: 08/20/15  1:02 PM  Result Value Ref Range   Sodium 135 135 - 145 mmol/L   Potassium 3.8 3.5 - 5.1 mmol/L   Chloride 101 101 - 111 mmol/L   CO2 26 22 - 32 mmol/L   Glucose, Bld 103 (H) 65 - 99 mg/dL   BUN 16 6 - 20 mg/dL   Creatinine, Ser 08/22/15 (H) 0.44 - 1.00 mg/dL   Calcium 8.9 8.9 - 5.62 mg/dL   GFR calc non Af Amer 39 (L) >60 mL/min   GFR calc Af Amer 45 (L) >60 mL/min   Anion gap 8 5 - 15  Brain natriuretic peptide     Status: None   Collection Time: 08/20/15  1:02 PM  Result Value Ref Range   B Natriuretic Peptide 86.2 0.0 - 100.0 pg/mL  Basic metabolic panel     Status: Abnormal   Collection Time: 08/21/15  2:15 AM  Result Value Ref Range   Sodium 138 135 - 145 mmol/L   Potassium 3.4 (L) 3.5 - 5.1 mmol/L   Chloride 102 101 - 111 mmol/L   CO2 26 22 - 32 mmol/L   Glucose, Bld 99 65 - 99 mg/dL   BUN 13 6 - 20  mg/dL   Creatinine, Ser 08/23/15 (H) 0.44 - 1.00 mg/dL   Calcium 8.9 8.9 - 8.65 mg/dL   GFR calc non Af Amer 41 (L) >60 mL/min   GFR calc Af Amer 47 (L) >60 mL/min   Anion gap 10 5 - 15  Protime-INR     Status: Abnormal   Collection Time: 08/21/15  2:15 AM  Result Value Ref Range   Prothrombin Time 24.3 (H) 11.6 - 15.2 seconds   INR 2.20 (H) 0.00 - 1.49  CBC     Status: None   Collection Time: 08/21/15  2:15 AM  Result Value Ref Range   WBC 8.9 4.0 - 10.5 K/uL   RBC 4.60 3.87 - 5.11 MIL/uL   Hemoglobin 14.6 12.0 - 15.0 g/dL   HCT 08/23/15 69.6 - 29.5 %   MCV 96.7 78.0 - 100.0 fL   MCH 31.7 26.0 - 34.0 pg   MCHC 32.8 30.0 -  36.0 g/dL   RDW 34.1 93.7 - 90.2 %   Platelets 295 150 - 400 K/uL    Studies/Results: No results found.  MedicationsReviewed   @PROBHOSP @  1 chronic diastolic CHF  Volume status loooks good  She is off meds  Family will need to follow closely  2  HTN  BP is good   OFF of meds   Follow closely as outpt  3.  Chronic afib  Rates good  OFF of meds   On coumadin  Close outpt follow up with titration slowly of meds  Spoke with AManda (grandaughter)    D/C today     08/21/2015, 9:01 AM

## 2015-08-23 DIAGNOSIS — I2510 Atherosclerotic heart disease of native coronary artery without angina pectoris: Secondary | ICD-10-CM | POA: Diagnosis not present

## 2015-08-23 DIAGNOSIS — E669 Obesity, unspecified: Secondary | ICD-10-CM | POA: Diagnosis not present

## 2015-08-23 DIAGNOSIS — I5033 Acute on chronic diastolic (congestive) heart failure: Secondary | ICD-10-CM | POA: Diagnosis not present

## 2015-08-23 DIAGNOSIS — E785 Hyperlipidemia, unspecified: Secondary | ICD-10-CM | POA: Diagnosis not present

## 2015-08-23 DIAGNOSIS — K219 Gastro-esophageal reflux disease without esophagitis: Secondary | ICD-10-CM | POA: Diagnosis not present

## 2015-08-23 DIAGNOSIS — Z950 Presence of cardiac pacemaker: Secondary | ICD-10-CM | POA: Diagnosis not present

## 2015-08-23 DIAGNOSIS — I482 Chronic atrial fibrillation: Secondary | ICD-10-CM | POA: Diagnosis not present

## 2015-08-23 DIAGNOSIS — I110 Hypertensive heart disease with heart failure: Secondary | ICD-10-CM | POA: Diagnosis not present

## 2015-08-23 DIAGNOSIS — M810 Age-related osteoporosis without current pathological fracture: Secondary | ICD-10-CM | POA: Diagnosis not present

## 2015-08-25 DIAGNOSIS — I5033 Acute on chronic diastolic (congestive) heart failure: Secondary | ICD-10-CM | POA: Diagnosis not present

## 2015-08-25 DIAGNOSIS — I110 Hypertensive heart disease with heart failure: Secondary | ICD-10-CM | POA: Diagnosis not present

## 2015-08-25 DIAGNOSIS — Z950 Presence of cardiac pacemaker: Secondary | ICD-10-CM | POA: Diagnosis not present

## 2015-08-25 DIAGNOSIS — K219 Gastro-esophageal reflux disease without esophagitis: Secondary | ICD-10-CM | POA: Diagnosis not present

## 2015-08-25 DIAGNOSIS — M810 Age-related osteoporosis without current pathological fracture: Secondary | ICD-10-CM | POA: Diagnosis not present

## 2015-08-25 DIAGNOSIS — I482 Chronic atrial fibrillation: Secondary | ICD-10-CM | POA: Diagnosis not present

## 2015-08-25 DIAGNOSIS — E669 Obesity, unspecified: Secondary | ICD-10-CM | POA: Diagnosis not present

## 2015-08-25 DIAGNOSIS — E785 Hyperlipidemia, unspecified: Secondary | ICD-10-CM | POA: Diagnosis not present

## 2015-08-25 DIAGNOSIS — I2510 Atherosclerotic heart disease of native coronary artery without angina pectoris: Secondary | ICD-10-CM | POA: Diagnosis not present

## 2015-08-27 ENCOUNTER — Encounter (HOSPITAL_COMMUNITY): Payer: Medicare HMO | Admitting: Internal Medicine

## 2015-08-27 DIAGNOSIS — I5030 Unspecified diastolic (congestive) heart failure: Secondary | ICD-10-CM | POA: Diagnosis not present

## 2015-08-27 DIAGNOSIS — I4891 Unspecified atrial fibrillation: Secondary | ICD-10-CM | POA: Diagnosis not present

## 2015-08-27 DIAGNOSIS — I129 Hypertensive chronic kidney disease with stage 1 through stage 4 chronic kidney disease, or unspecified chronic kidney disease: Secondary | ICD-10-CM | POA: Diagnosis not present

## 2015-08-27 DIAGNOSIS — R7303 Prediabetes: Secondary | ICD-10-CM | POA: Diagnosis not present

## 2015-08-27 DIAGNOSIS — N182 Chronic kidney disease, stage 2 (mild): Secondary | ICD-10-CM | POA: Diagnosis not present

## 2015-08-28 DIAGNOSIS — I482 Chronic atrial fibrillation: Secondary | ICD-10-CM | POA: Diagnosis not present

## 2015-08-28 DIAGNOSIS — Z950 Presence of cardiac pacemaker: Secondary | ICD-10-CM | POA: Diagnosis not present

## 2015-08-28 DIAGNOSIS — M810 Age-related osteoporosis without current pathological fracture: Secondary | ICD-10-CM | POA: Diagnosis not present

## 2015-08-28 DIAGNOSIS — E669 Obesity, unspecified: Secondary | ICD-10-CM | POA: Diagnosis not present

## 2015-08-28 DIAGNOSIS — I5033 Acute on chronic diastolic (congestive) heart failure: Secondary | ICD-10-CM | POA: Diagnosis not present

## 2015-08-28 DIAGNOSIS — K219 Gastro-esophageal reflux disease without esophagitis: Secondary | ICD-10-CM | POA: Diagnosis not present

## 2015-08-28 DIAGNOSIS — I2510 Atherosclerotic heart disease of native coronary artery without angina pectoris: Secondary | ICD-10-CM | POA: Diagnosis not present

## 2015-08-28 DIAGNOSIS — I110 Hypertensive heart disease with heart failure: Secondary | ICD-10-CM | POA: Diagnosis not present

## 2015-08-28 DIAGNOSIS — E785 Hyperlipidemia, unspecified: Secondary | ICD-10-CM | POA: Diagnosis not present

## 2015-09-01 ENCOUNTER — Encounter: Payer: Self-pay | Admitting: Internal Medicine

## 2015-09-01 ENCOUNTER — Ambulatory Visit (INDEPENDENT_AMBULATORY_CARE_PROVIDER_SITE_OTHER): Payer: Commercial Managed Care - HMO | Admitting: Internal Medicine

## 2015-09-01 VITALS — BP 110/80 | HR 112 | Ht 60.0 in | Wt 194.8 lb

## 2015-09-01 DIAGNOSIS — I482 Chronic atrial fibrillation: Secondary | ICD-10-CM

## 2015-09-01 DIAGNOSIS — I495 Sick sinus syndrome: Secondary | ICD-10-CM | POA: Diagnosis not present

## 2015-09-01 DIAGNOSIS — E669 Obesity, unspecified: Secondary | ICD-10-CM | POA: Diagnosis not present

## 2015-09-01 DIAGNOSIS — I5030 Unspecified diastolic (congestive) heart failure: Secondary | ICD-10-CM | POA: Diagnosis not present

## 2015-09-01 DIAGNOSIS — K219 Gastro-esophageal reflux disease without esophagitis: Secondary | ICD-10-CM | POA: Diagnosis not present

## 2015-09-01 DIAGNOSIS — I110 Hypertensive heart disease with heart failure: Secondary | ICD-10-CM | POA: Diagnosis not present

## 2015-09-01 DIAGNOSIS — Z950 Presence of cardiac pacemaker: Secondary | ICD-10-CM

## 2015-09-01 DIAGNOSIS — I4821 Permanent atrial fibrillation: Secondary | ICD-10-CM

## 2015-09-01 DIAGNOSIS — I5033 Acute on chronic diastolic (congestive) heart failure: Secondary | ICD-10-CM | POA: Diagnosis not present

## 2015-09-01 DIAGNOSIS — M810 Age-related osteoporosis without current pathological fracture: Secondary | ICD-10-CM | POA: Diagnosis not present

## 2015-09-01 DIAGNOSIS — E785 Hyperlipidemia, unspecified: Secondary | ICD-10-CM | POA: Diagnosis not present

## 2015-09-01 DIAGNOSIS — I2510 Atherosclerotic heart disease of native coronary artery without angina pectoris: Secondary | ICD-10-CM | POA: Diagnosis not present

## 2015-09-01 LAB — CUP PACEART INCLINIC DEVICE CHECK
Battery Remaining Longevity: 110.4
Implantable Lead Implant Date: 20100616
Implantable Lead Location: 753860
Implantable Lead Model: 1948
Lead Channel Setting Pacing Amplitude: 2 V
MDC IDC LEAD IMPLANT DT: 20100616
MDC IDC LEAD LOCATION: 753859
MDC IDC MSMT BATTERY VOLTAGE: 2.93 V
MDC IDC MSMT LEADCHNL RV IMPEDANCE VALUE: 662.5 Ohm
MDC IDC PG SERIAL: 2285053
MDC IDC SESS DTM: 20170801124344
MDC IDC SET LEADCHNL RV PACING PULSEWIDTH: 0.4 ms
MDC IDC SET LEADCHNL RV SENSING SENSITIVITY: 2 mV
MDC IDC STAT BRADY RA PERCENT PACED: 0 %
MDC IDC STAT BRADY RV PERCENT PACED: 18 %

## 2015-09-01 MED ORDER — METOPROLOL TARTRATE 50 MG PO TABS: 50.0000 mg | ORAL_TABLET | Freq: Two times a day (BID) | ORAL | Status: DC

## 2015-09-01 NOTE — Progress Notes (Signed)
Patient Care Team: Charlott Rakes, MD as PCP - General (Family Medicine) Charlott Rakes, MD as Referring Physician (Family Medicine)   HPI  [name] is a 82 y.o. female Seen in followup for pacer implanted for tachybrady and now with permanent AF  She continues with dyspnea on exertion which has been progressive and assoc w  asymmetric edema. She was seen by her PCP who undertook a left lower extremity Doppler>> noted no DVT. Her bisoprolol was changed to metoprolol; not quite sure why. TSH 1/17 was normal  She notes that her heart rate is racing.  Device interrogation last month and demonstrated significantly rapid rates with 7% of her beats faster than 120 bpm.  Her metoprolol was increased to 100 bid   she was intercurrently hospitalized with congestive failure. She underwent diuresis complicated by hypotension and prompted the discontinuation of her beta blockers and calcium blockers and she was discharged with neither of them.  She continues with severe shortness of breath.  Limited to less than 50 feet. Edema has resolved.       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   She previously was seen by the heart failure clinic; it has been more than 2 years.   Echo 10/14 EF>>55-60% Echo 7/17 EF-normal Past Medical History:  Diagnosis Date  . Anticoagulation goal of INR 2 to 3 08/21/2015  . Arthritis    oa  . Balance problem   . Chronic diastolic CHF (congestive heart failure) (HCC)   . Dyspnea   . GERD (gastroesophageal reflux disease)   . Hyperlipidemia   . Hypertension   . Hypotensive episode 08/21/2015  . Mild CAD    a. nonobst CAD by cath 09/2010.  . Obesity   . Osteoporosis   . Pacemaker-St. Jude   . Permanent atrial fibrillation (HCC)    a. c/b tachy-brady syndrome s/p PPM 2010.  [name] Stroke [facility]) 07/2013    Past Surgical History:  Procedure Laterality Date  . ABDOMINAL HYSTERECTOMY    . BREAST LUMPECTOMY Right   . CARDIAC CATHETERIZATION    .  COLECTOMY  1991  . INSERT / REPLACE / REMOVE PACEMAKER     St Jude  . SPLENECTOMY    . TOTAL ABDOMINAL HYSTERECTOMY W/ BILATERAL SALPINGOOPHORECTOMY  1974    Current Outpatient Prescriptions  Medication Sig Dispense Refill  . calcium carbonate (TUMS) 500 MG chewable tablet Chew 1 tablet by mouth daily as needed for indigestion or heartburn.    . carbidopa-levodopa (SINEMET CR) 50-200 MG tablet Take 1 tablet by mouth 2 (two) times daily.    [name] omeprazole (PRILOSEC) 20 MG capsule Take 20 mg by mouth daily.      [name] rOPINIRole (REQUIP) 0.5 MG tablet Take 0.5 mg by mouth at bedtime.    [name] warfarin (COUMADIN) 3 MG tablet Take 3 mg by mouth daily.      No current facility-administered medications for this visit.     Allergies  Allergen Reactions  . Lipitor [Atorvastatin] Other (See Comments)    Myalgias (noted in neurologist note on 08/23/2013 when admitted for acute CVA).  08/20/15:  Pravastatin started at that time. However, patient is not taking Pravastatin/told okay to stop it by PCP. [facility] said it was last filled in 07/2013)   . Morphine And Related Other (See Comments)    hallucinations    Review of Systems negative except from HPI and PMH  Physical Exam Ht 5' (1.524 m)   Wt 197 lb (  89.4 kg)   BMI 38.47 kg/m  Well developed and well nourished in no acute distress HENT normal E scleral and icterus clear Neck Supple JVP 7-8 ; carotids brisk and full Clear to ausculation Kyphosis Device pocket well healed; without hematoma or erythema.  There is no tethering Regular rate and rhythm, no murmurs gallops or rub Soft with active bowel sounds No clubbing cyanosis tr Edema Alert and oriented, grossly normal motor and sensory function Skin Warm and Dry  ECG demonstrates atrial fibrillation at 112 Intervals-/07/35   Assessment and  Plan  HFpEF     Atrial fib-permanent Increasingly rapid ventricular response Continue warfarin  Hypertension  Well controlled  Pacemaker  St Jude The patient's device was interrogated.  The information was reviewed. No changes were made in the programming.      she is having progressive problems with dyspnea HFpEF related to atrial fibrillation and rapid rates.  Her blood pressure is limiting up titration of rate control. I think her alternatives are amiodarone as a IIb indication or AV junction ablation.  She is averse to taking medications and indeed has not been taking her medications well. Granddaughter, Marchelle Folks, has tried to encourage her in this regard. The family is away at the beach and they will have a conversation with the return his weekend as to which of the above courses they would like to pursue.  We spent more than 50% of our >25 min visit in face to face counseling regarding the above

## 2015-09-01 NOTE — Patient Instructions (Addendum)
Medication Instructions: - Increase metoprolol tartrate to 50 mg one tablet by mouth twice daily  Labwork: - none  Procedures/Testing: - none  Follow-Up: - Calla Wedekind, Dr. Odessa Fleming nurse will call Marchelle Folks next week to see what the next steps will be.   Any Additional Special Instructions Will Be Listed Below (If Applicable).     If you need a refill on your cardiac medications before your next appointment, please call your pharmacy.

## 2015-09-02 ENCOUNTER — Other Ambulatory Visit: Payer: Self-pay

## 2015-09-02 NOTE — Patient Outreach (Signed)
[facility]) Care Management  09/02/2015  [name] May 02, 1923 [id number]  REFERRAL SOURCE:  EMMI HEART FAILURE REFERRAL REASON: EMMI heart failure red alert  Telephone call to patient regarding  EMMI heart failure alert.  Unable to reach patient.  HIPAA compliant voice message left with call back phone number.   PLAN:  RNCM will attempt 2nd telephone call to patient within 1 week. George Ina RN,BSN,CCM [facility] Telephonic  [phone]

## 2015-09-03 ENCOUNTER — Other Ambulatory Visit: Payer: Self-pay

## 2015-09-03 NOTE — Patient Outreach (Signed)
[facility]) Care Management  09/03/2015  [name] 09-28-1923 [id number]   REFERRAL SOURCE:  EMMI heart failure REFERRAL REASON:  EMMI Heart failure red alert CONSENT;  Self/patient.  Patient also gives verbal consent to speak with her granddaughter [name] regarding all of her medical information.   PROVIDERS: Dr. Charlott Rakes Dr. Candice Camp Dr. Graciela Husbands  Support system: Granddaughter [name],  Daughter: Roselee Nova Granddaughter:  Baron Sane  Receiving [facility]  SUBJECTIVE: Telephone call to patient regarding EMMI heart failure red alert.  HIPAA verified with patient. Patient confirmed that she was recently in the hospital of heart failure from 08/18/15 to 08/21/15.   Patient states she is feeling, "tired and worn out."  Patient states I have atrial fibrillation and my heart just race all of the time.  It makes me tired and worn out."  Patient states she saw Dr. Graciela Husbands this week.  Patient states Dr. Graciela Husbands increased her metoprolol from 1/2 tablet 2 times per day to 1 whole tablet 2 times per day. Patient states she has an appointment scheduled to see Dr. Jones Broom on 09/09/15 and has had a follow up appointment with her primary doctor since she was discharged from the hospital.  [facility] called Dr. Yetta Flock office to confirm last seen date of patient 08/31/15. Patient states she has all of her medications and is taking them as prescribed. Patient states she continues to shortness of breath with activity.  Patient states she has to rest often. Patient states the swelling in her feet has gone down. Patient states her feet will swell again by the evening. Patient states she can tell that he feet and legs are not swelling like they were before she went in the hospital. Patient states she weighs daily and records. Patient reports she adheres to a low salt/ no salt diet. Patient reports her appetite is not like it use to be. Patient states sometimes she doesn't feel  like eating much and the food doesn't taste as good since she cannot have salt. Patient states her daughter lives beside her and cooks for her everyday. RNCM discussed and offered [facility] care management services to patient. Patient declined community case management services.  Patient agreed to telephone follow up only.  Patient states her granddaughter is a Engineer, civil (consulting) and she has very good family support.  RNCM advised patient to keep her appointments as scheduled. RNCM advised patient to continue to take her medications as prescribed. RNCM discussed signs/symptoms of heart failure with patient.  RNCM advised patient to contact doctor for heart failure symptoms and/or increased atrial fibrillation symptoms. RNCM advised patient to weigh herself daily and record.  Patient agreed to next telephone outreach with [facility]. Patient verbally agreed to receive EMMI education material   ASSESSMENT:  82 y/o F with history of permanent atrial fib, syncope with tachybrady syndrome s/p PPM 2010, chronic dyspnea, h/o bradycardia (bisoprolol reduced), HTN, GERD, PACs by event monitor, nonobstructive CAD by cath in 09/2010 (neg nuc 2011), balance issues, stroke 2015 who presented to [facility] with complaints of CP and SOB.  Per hospital discharge summary from 08/21/15 Principal Problem:   Acute on chronic congestive heart failure (HCC) Active Problems:   Hypotensive episode   Essential hypertension   Chronic atrial fibrillation (HCC)   Chronic diastolic heart failure (HCC)   Cardiac pacemaker-St. Jude-vvi   Shortness of breath   Acute on chronic diastolic (congestive) heart failure (HCC)   Accelerated hypertension   Elevated troponin- for one  draw- may be spurious   Pain in the chest   Anticoagulation goal of INR 2 to 3  PLAN:  RNCM will follow up with patient within 1 week.  RNCM will send patient EMMI education material on low sodium diet and Atrial fib.   George Ina RN,BSN,CCM [facility] Telephonic   [phone]

## 2015-09-04 DIAGNOSIS — M810 Age-related osteoporosis without current pathological fracture: Secondary | ICD-10-CM | POA: Diagnosis not present

## 2015-09-04 DIAGNOSIS — I5033 Acute on chronic diastolic (congestive) heart failure: Secondary | ICD-10-CM | POA: Diagnosis not present

## 2015-09-04 DIAGNOSIS — I110 Hypertensive heart disease with heart failure: Secondary | ICD-10-CM | POA: Diagnosis not present

## 2015-09-04 DIAGNOSIS — Z950 Presence of cardiac pacemaker: Secondary | ICD-10-CM | POA: Diagnosis not present

## 2015-09-04 DIAGNOSIS — I2510 Atherosclerotic heart disease of native coronary artery without angina pectoris: Secondary | ICD-10-CM | POA: Diagnosis not present

## 2015-09-04 DIAGNOSIS — I482 Chronic atrial fibrillation: Secondary | ICD-10-CM | POA: Diagnosis not present

## 2015-09-04 DIAGNOSIS — K219 Gastro-esophageal reflux disease without esophagitis: Secondary | ICD-10-CM | POA: Diagnosis not present

## 2015-09-04 DIAGNOSIS — E785 Hyperlipidemia, unspecified: Secondary | ICD-10-CM | POA: Diagnosis not present

## 2015-09-04 DIAGNOSIS — E669 Obesity, unspecified: Secondary | ICD-10-CM | POA: Diagnosis not present

## 2015-09-07 DIAGNOSIS — I110 Hypertensive heart disease with heart failure: Secondary | ICD-10-CM | POA: Diagnosis not present

## 2015-09-07 DIAGNOSIS — Z950 Presence of cardiac pacemaker: Secondary | ICD-10-CM | POA: Diagnosis not present

## 2015-09-07 DIAGNOSIS — I2510 Atherosclerotic heart disease of native coronary artery without angina pectoris: Secondary | ICD-10-CM | POA: Diagnosis not present

## 2015-09-07 DIAGNOSIS — I5033 Acute on chronic diastolic (congestive) heart failure: Secondary | ICD-10-CM | POA: Diagnosis not present

## 2015-09-07 DIAGNOSIS — I482 Chronic atrial fibrillation: Secondary | ICD-10-CM | POA: Diagnosis not present

## 2015-09-07 DIAGNOSIS — E669 Obesity, unspecified: Secondary | ICD-10-CM | POA: Diagnosis not present

## 2015-09-07 DIAGNOSIS — M810 Age-related osteoporosis without current pathological fracture: Secondary | ICD-10-CM | POA: Diagnosis not present

## 2015-09-07 DIAGNOSIS — E785 Hyperlipidemia, unspecified: Secondary | ICD-10-CM | POA: Diagnosis not present

## 2015-09-07 DIAGNOSIS — K219 Gastro-esophageal reflux disease without esophagitis: Secondary | ICD-10-CM | POA: Diagnosis not present

## 2015-09-09 ENCOUNTER — Ambulatory Visit (HOSPITAL_COMMUNITY)
Admit: 2015-09-09 | Discharge: 2015-09-09 | Disposition: A | Discharge status: Home / Self Care | Payer: Commercial Managed Care - HMO | Source: Ambulatory Visit | Attending: Internal Medicine | Admitting: Internal Medicine

## 2015-09-09 VITALS — BP 130/80 | HR 105 | Wt 196.5 lb

## 2015-09-09 DIAGNOSIS — R7301 Impaired fasting glucose: Secondary | ICD-10-CM | POA: Diagnosis not present

## 2015-09-09 DIAGNOSIS — I482 Chronic atrial fibrillation, unspecified: Secondary | ICD-10-CM

## 2015-09-09 DIAGNOSIS — E669 Obesity, unspecified: Secondary | ICD-10-CM | POA: Insufficient documentation

## 2015-09-09 DIAGNOSIS — I129 Hypertensive chronic kidney disease with stage 1 through stage 4 chronic kidney disease, or unspecified chronic kidney disease: Secondary | ICD-10-CM | POA: Diagnosis not present

## 2015-09-09 DIAGNOSIS — K219 Gastro-esophageal reflux disease without esophagitis: Secondary | ICD-10-CM | POA: Insufficient documentation

## 2015-09-09 DIAGNOSIS — I2510 Atherosclerotic heart disease of native coronary artery without angina pectoris: Secondary | ICD-10-CM | POA: Diagnosis not present

## 2015-09-09 DIAGNOSIS — I110 Hypertensive heart disease with heart failure: Secondary | ICD-10-CM | POA: Diagnosis not present

## 2015-09-09 DIAGNOSIS — Z950 Presence of cardiac pacemaker: Secondary | ICD-10-CM | POA: Diagnosis not present

## 2015-09-09 DIAGNOSIS — E785 Hyperlipidemia, unspecified: Secondary | ICD-10-CM | POA: Diagnosis not present

## 2015-09-09 DIAGNOSIS — I5032 Chronic diastolic (congestive) heart failure: Secondary | ICD-10-CM | POA: Diagnosis not present

## 2015-09-09 DIAGNOSIS — Z8673 Personal history of transient ischemic attack (TIA), and cerebral infarction without residual deficits: Secondary | ICD-10-CM | POA: Insufficient documentation

## 2015-09-09 DIAGNOSIS — I4891 Unspecified atrial fibrillation: Secondary | ICD-10-CM | POA: Diagnosis not present

## 2015-09-09 DIAGNOSIS — N182 Chronic kidney disease, stage 2 (mild): Secondary | ICD-10-CM | POA: Diagnosis not present

## 2015-09-09 DIAGNOSIS — Z79899 Other long term (current) drug therapy: Secondary | ICD-10-CM | POA: Insufficient documentation

## 2015-09-09 DIAGNOSIS — Z6838 Body mass index (BMI) 38.0-38.9, adult: Secondary | ICD-10-CM | POA: Insufficient documentation

## 2015-09-09 DIAGNOSIS — I10 Essential (primary) hypertension: Secondary | ICD-10-CM

## 2015-09-09 DIAGNOSIS — Z7901 Long term (current) use of anticoagulants: Secondary | ICD-10-CM | POA: Diagnosis not present

## 2015-09-09 DIAGNOSIS — I495 Sick sinus syndrome: Secondary | ICD-10-CM | POA: Insufficient documentation

## 2015-09-09 MED ORDER — AMIODARONE HCL 200 MG PO TABS: 200.0000 mg | ORAL_TABLET | Freq: Two times a day (BID) | ORAL | 3 refills | Status: DC

## 2015-09-09 NOTE — Progress Notes (Signed)
Advanced Heart Failure Medication Review by a Pharmacist  Does the patient  feel that his/her medications are working for him/her?  yes  Has the patient been experiencing any side effects to the medications prescribed?  no  Does the patient measure his/her own blood pressure or blood glucose at home?  yes   Does the patient have any problems obtaining medications due to transportation or finances?   no  Understanding of regimen: good Understanding of indications: good Potential of compliance: good Patient understands to avoid NSAIDs. Patient understands to avoid decongestants.  Issues to address at subsequent visits: None   Pharmacist comments:  [name] is a pleasant 82 yo F presenting with her daughter and without a medication list. She has a good understanding of her regimen and did not have any specific medication-related questions or concerns for me at this time.   [name], PharmD, BCPS, CPP Clinical Pharmacist Pager: [phone] Phone: [phone] 09/09/2015 3:28 PM      Time with patient: 8 minutes Preparation and documentation time: 2 minutes Total time: 10 minutes

## 2015-09-09 NOTE — Patient Instructions (Signed)
Start Amiodarone 200 mg Twice daily   Your physician recommends that you schedule a follow-up appointment in: 4 week

## 2015-09-09 NOTE — Addendum Note (Signed)
Encounter addended by: Noralee Space, RN on: 09/09/2015  3:52 PM<BR>    Actions taken: Order Entry activity accessed, Sign clinical note

## 2015-09-09 NOTE — Progress Notes (Signed)
[name]  ADVANCED HF CLINIC NOTE  Patient ID: [name] [name], female   DOB: 01/27/1924, 82 y.o.   MRN: [id number] Patient ID: [name], female   DOB: 1923-06-21, 82 y.o.   MRN: [id number]  HPI: [name] is a delightful 82 year old woman with a history of diastolic HF, chronic atrial fibrillation c/b tachybrady syndrome s/p pacemaker placement in 2010, chronic dyspnea as well as hypertension and gastroesophageal reflux disease.   09/2010  LHC = Aortic pressure is 132/74 with a mean of 100 mmHg, left ventricular pressure is 139 with an EDP of 16 mmHg. L Main normal LAD 0% narrowing in the proximal and midvessel. The first diagonal branch has a 40% ostial stenosis. The left circumflex coronary artery has mild irregularities up to 10- 20%. The right coronary artery is a dominant vessel. It has mild wall irregularities less than or equal to 10%.   Follow up:  She presents today for routine f/u. She reports chronic palpitations and feel worn out. Dyspnea on any exertion. + LE edema.  Admitted to the hospital in June with CHF. notes that her heart rate is racing. She underwent diuresis complicated by hypotension and prompted the discontinuation of her beta blockers and calcium blockers and she was discharged with neither of them. She takes lasix most days but not every day. Weight up slightly.   She saw Dr. Graciela Husbands recently. An he noted that nearly 40% of her rates were > 110. Suggested amiodarone vs AV node ablation. He increased metoprolol to 50 bid without much benefit.   Labs 09/26/12: K 3.5, creatinine 0.84, proBNP 1265          10/09/12: K 3.5, Cr 0.83, AST 21, ALT 11  ECHO 07/2009: EF 55-60%, mild MR, LA mod dilated, RA mild dilated ECHO 10/14: EF 55-60%, biatrial enlargement, normal RV size/systolic function, PA systolic pressure 39 mmHg.  ECHO 7/17:  EF 55-60% RVSP 39 biatrial enlargement.   SH: Lives in [place] with her husband, nonsmoker.   ROS: All systems negative except as listed in HPI, PMH  and Problem List.  Past Medical History:  Diagnosis Date  . Anticoagulation goal of INR 2 to 3 08/21/2015  . Arthritis    oa  . Balance problem   . Chronic diastolic CHF (congestive heart failure) (HCC)   . Dyspnea   . GERD (gastroesophageal reflux disease)   . Hyperlipidemia   . Hypertension   . Hypotensive episode 08/21/2015  . Mild CAD    a. nonobst CAD by cath 09/2010.  . Obesity   . Osteoporosis   . Pacemaker-St. Jude   . Permanent atrial fibrillation (HCC)    a. c/b tachy-brady syndrome s/p PPM 2010.  [name] Stroke [facility]) 07/2013   Current Outpatient Prescriptions  Medication Sig Dispense Refill  . calcium carbonate (TUMS) 500 MG chewable tablet Chew 1 tablet by mouth daily as needed for indigestion or heartburn.    . carbidopa-levodopa (SINEMET CR) 50-200 MG tablet Take 1 tablet by mouth 2 (two) times daily.    . metoprolol (LOPRESSOR) 50 MG tablet Take 1 tablet (50 mg total) by mouth 2 (two) times daily.    [name] omeprazole (PRILOSEC) 20 MG capsule Take 20 mg by mouth daily.      [name] rOPINIRole (REQUIP) 0.5 MG tablet Take 0.5 mg by mouth at bedtime.    [name] warfarin (COUMADIN) 3 MG tablet Take 3 mg by mouth daily.      No current facility-administered medications for this encounter.  Vitals:   09/09/15 1458  BP: 130/80  Pulse: (!) 105  SpO2: 98%  Weight: 196 lb 8 oz (89.1 kg)    PHYSICAL EXAM: General:  Elderly, chronically ill appearing. +diffuse  tremor. No acute distress. With daughter HEENT: normal;  Neck: supple. JVD 6-7 . Carotids 2+ bilat; no bruits. No lymphadenopathy or thryomegaly appreciated. Cor: PMI nondisplaced. Irregular rate & rhythm. No rubs, gallops, murmur. Back + scoliosis/kyphosis. Lungs: clear Abdomen: soft, nontender,  nondistended.  Good bowel sounds. Extremities: no cyanosis, clubbing, rash, trace ankle edema  Neuro: alert & orientedx3, cranial nerves grossly intact. moves all 4 extremities w/o difficulty  ASSESSMENT & PLAN:  1) Chronic Diastolic  HF, EF 03-21% - Volume status stable to mildly elevated. Reminded her to take lasix every day  Reinforced need for daily weights and reviewed use of sliding scale diuretics. 2) Chronic A-fib with elevated rates -  She is going quite fast and is symptomatic. Unable to tolerate higher doses of AV nodal blockade. I d/w Dr. Graciela Husbands. Will try amiodarone 200 bid and recheck rates in 4 weeks. If still fast consider AV node ablation.  3) HTN - well controlled 4) CVA S/P 2015. -on coumadin, and statin    Marilyn Harvie,MD 3:37 PM

## 2015-09-10 ENCOUNTER — Other Ambulatory Visit: Payer: Self-pay

## 2015-09-10 NOTE — Patient Outreach (Signed)
[facility]) Care Management  09/10/2015  [name] January 11, 1924 [id number]   REFERRAL SOURCE:  EMMI heart failure REFERRAL REASON:  EMMI Heart failure red alert CONSENT;  Self/patient.  Patient also gives verbal consent to speak with her granddaughter [name] regarding all of her medical information.   PROVIDERS: Dr. Charlott Rakes Dr. Candice Camp Dr. Graciela Husbands  Support system: Granddaughter [name],  Daughter: Roselee Nova Granddaughter:  Baron Sane  Receiving [facility]  SUBJECTIVE;  Telephone call to patient regarding EMMI heart failure follow up.  HIPAA verified with patient. Patient states she saw Dr. Gala Romney on yesterday 09/09/15.  Patient states her heart continues to beat fast and it wears her out.  Patient states Dr. Gala Romney is aware and adjusted her medication.  Patient states he is going to see how she does on the medication for 4 weeks.  Patient states if her heart doesn't slow down she may have to have a procedure. Patient states her weight today is 196 lbs. Patient states she still feels a little short of breath and tired.  Patient states she doesn't always take her fluid pill everyday especially if she is going out.  Patient states she wears briefs but sometime they will leak.  RNCM reinforced with patient need to take fluid pill to help with the reduction of swelling and shortness of breath. RNCM informed patient that Dr. Gala Romney wants her to take her lasix daily as stated in his office note on 09/09/15.  Patient verbalized understanding.   Patient states her children get upset with her if she doesn't take it everyday.  Patient states she will start taking it consistently in the morning.  RNCM discussed with patient heart failure action plan/zones.  RNCM advised patient to take her medications as prescribed and weigh and record weights daily.   RNCM advised patient to keep her appointments with her doctors.  RNCM advised patient to  report to doctor increase weight gain of 3 lbs overnight or 5 lbs in a week.    ASSESSMENT; Patient seen by Dr. Gala Romney on 09/09/15.  Patient to have amiodarone 200mg  2 times per day and advised to take her lasix daily.  RNCM reviewed this with patient.   PLAN:  RNCM will follow up with patient within 1 week. RNCM will send patient heart failure action plan/ zone magnet RNCM will send patient heart failure EMMI education material Patient will report she is taking her lasix daily as prescribed.   RN,BSN,CCM [facility] Telephonic  [phone]

## 2015-09-14 ENCOUNTER — Encounter: Payer: Self-pay | Admitting: Internal Medicine

## 2015-09-15 DIAGNOSIS — I110 Hypertensive heart disease with heart failure: Secondary | ICD-10-CM | POA: Diagnosis not present

## 2015-09-15 DIAGNOSIS — K219 Gastro-esophageal reflux disease without esophagitis: Secondary | ICD-10-CM | POA: Diagnosis not present

## 2015-09-15 DIAGNOSIS — I482 Chronic atrial fibrillation: Secondary | ICD-10-CM | POA: Diagnosis not present

## 2015-09-15 DIAGNOSIS — E669 Obesity, unspecified: Secondary | ICD-10-CM | POA: Diagnosis not present

## 2015-09-15 DIAGNOSIS — M810 Age-related osteoporosis without current pathological fracture: Secondary | ICD-10-CM | POA: Diagnosis not present

## 2015-09-15 DIAGNOSIS — I5033 Acute on chronic diastolic (congestive) heart failure: Secondary | ICD-10-CM | POA: Diagnosis not present

## 2015-09-15 DIAGNOSIS — E785 Hyperlipidemia, unspecified: Secondary | ICD-10-CM | POA: Diagnosis not present

## 2015-09-15 DIAGNOSIS — I2510 Atherosclerotic heart disease of native coronary artery without angina pectoris: Secondary | ICD-10-CM | POA: Diagnosis not present

## 2015-09-15 DIAGNOSIS — Z950 Presence of cardiac pacemaker: Secondary | ICD-10-CM | POA: Diagnosis not present

## 2015-09-16 ENCOUNTER — Other Ambulatory Visit: Payer: Self-pay

## 2015-09-16 NOTE — Patient Outreach (Signed)
[facility]) Care Management  09/16/2015  DOYNE ELLINGER 08/05/1923 [id number]    REFERRAL SOURCE: EMMI heart failure REFERRAL REASON: EMMI Heart failure red alert ( New worsening problem) CONSENT; Self/patient. Patient also gives verbal consent to speak with her granddaughter [name] regarding all of her medical information.   Telephone call to patient regarding EMMI heart failure RED alert.  Unable to reach patient or leave voice message.  Phone only rang.    PLAN;  RNCM will attempt 2nd telephone call to patient within 1 week.  George Ina RN,BSN,CCM [facility] Telephonic  [phone]

## 2015-09-17 ENCOUNTER — Ambulatory Visit: Payer: Self-pay

## 2015-09-23 ENCOUNTER — Other Ambulatory Visit: Payer: Self-pay

## 2015-09-23 NOTE — Patient Outreach (Signed)
[facility]) Care Management  [facility] Care Manager  09/23/2015   [name] 1923/05/27 [id number]   REFERRAL DATE: Initial referral from [facility] program on 09/01/15. Patient referred to heart failure program on 09/23/15 REFERRAL SOURCE;  EMMI heart failure REFERRAL REASON:  Patient followed under the EMMI heart failure program. Patient now transition to [facility] care management services for case management CONSENT;  Self/ patient  PROVIDERS; Dr. Aurora Mask Dr.  Gala Romney Dr.  Graciela Husbands  SOCIAL:  Patient lives with her great granddaughter who is a nurse Patient also has two daughters that assist with her care Patient has transportation assistance by family to her doctor appointments.   Subjective: Telephone call to patient regarding heart failure program. HIPAA verified with patient.  Patient states she is doing pretty good today. Patient states she was able to go out on yesterday to have a pedicure done. Patient states she has noticed that her heart isn't beating as fast as it was since her doctor put her on new medication. {atient states I am not worn out like I was. Patient denies any abnormal shortness of breath or chest pain.  Patient states her feet are swollen this morning but states she has not taken her fluid pill yet.  Patient states she is ambulating without assistance.  Patient states her weight on yesterday was 194lbs.  Patient states her weight today is 190.4 lbs. Patient states she continues to take her medications as prescribed. Patient states she has to go to doctors for blood work 1 time per month due to being on coumadin.  Patient denies any concerns at this time. RNCM advised patient to continue to take her medications as prescribed. RNCM advised patient to call doctors office if she gains 3lbs overnight or 5 lbs in a week. RNCM advised patient to continue to weigh daily and record. RNCM discussed heart failure action plan with patient.  Patient states she received  heart failure packet from [facility] with heart failure action plan.  RNCM discussed with patient importance of adhering to a low salt diet. RNCM advised patient to continue to monitor blood pressure and pulse. Patient verbally agreed to next outreach with RNCM.   Objective: see assessment  Encounter Medications:  Outpatient Encounter Prescriptions as of 09/23/2015  Medication Sig  . amiodarone (PACERONE) 200 MG tablet Take 1 tablet (200 mg total) by mouth 2 (two) times daily.  . calcium carbonate (TUMS) 500 MG chewable tablet Chew 1 tablet by mouth daily as needed for indigestion or heartburn.  . carbidopa-levodopa (SINEMET CR) 50-200 MG tablet Take 1 tablet by mouth 2 (two) times daily.  . furosemide (LASIX) 20 MG tablet Take 20 mg by mouth daily.  . metoprolol (LOPRESSOR) 50 MG tablet Take 1 tablet (50 mg total) by mouth 2 (two) times daily.  [name] omeprazole (PRILOSEC) 20 MG capsule Take 20 mg by mouth daily.    [name] rOPINIRole (REQUIP) 0.5 MG tablet Take 0.5 mg by mouth at bedtime.  [name] warfarin (COUMADIN) 3 MG tablet Take 3 mg by mouth daily.    No facility-administered encounter medications on file as of 09/23/2015.     Functional Status:  In your present state of health, do you have any difficulty performing the following activities: 09/23/2015 08/19/2015  Hearing? N Y  Vision? N Y  Difficulty concentrating or making decisions? N N  Walking or climbing stairs? Y N  Dressing or bathing? N N  Doing errands, shopping? [name]  Preparing Food and eating ?  N -  Using the Toilet? N -  In the past six months, have you accidently leaked urine? Y -  Do you have problems with loss of bowel control? N -  Managing your Medications? N -  Managing your Finances? N -  Housekeeping or managing your Housekeeping? Y -  Some recent data might be hidden    Fall/Depression Screening: PHQ 2/9 Scores 09/23/2015 09/03/2015  PHQ - 2 Score 0 0    Fall Risk  09/23/2015 09/03/2015  Falls in the past year? No No     THN CM Care Plan Problem One   Flowsheet Row Most Recent Value  Care Plan Problem One  knowledge deficit relate to congestive heart failure  Role Documenting the Problem One  Care Management Telephonic Coordinator  Care Plan for Problem One  Active  THN Long Term Goal (31-90 days)  Patient will verbalize heart failure action plan zones within 60 days  THN Long Term Goal Start Date  09/23/15  Interventions for Problem One Long Term Goal  RNCM mailed patient heart failure packet with heart failure action plan/ zones  THN CM Short Term Goal #1 (0-30 days)  Patient will verbalize 3 yellow zone symptoms within 30 days  THN CM Short Term Goal #1 Start Date  09/23/15  Interventions for Short Term Goal #1  RNCM mailed patient action plan/ zones for heart failure,  RNCM reviewed yellow zones symptoms with patient  THN CM Short Term Goal #2 (0-30 days)  Patient will report she is weighing and recording weights daily within 30 days  THN CM Short Term Goal #2 Start Date  09/23/15  Interventions for Short Term Goal #2  RNCM discussed with patient importance of weighing and recording weights daily.  RNCM mailed EMMI education material to patient regarding heart failure and monitoring weight      Assessment:  Patient transitioned from [facility] program to heart failure program with [facility] care management.  Patient has strong family support. Patient appears to adhere to plan of care.  Patient will benefit from additional education and management of her heart failure disease.   Plan:  RNCM will follow up with patient within 3 weeks. RNCM will mail patient [facility] care management welcome packet with consent form RNCM will mail patient EMMI education material related to low salt diet.  RNCM will send patient primary MD involvement letter and assessment.  George Ina RN,BSN,CCM [facility] Telephonic  [phone]

## 2015-09-29 ENCOUNTER — Other Ambulatory Visit: Payer: Self-pay

## 2015-09-29 NOTE — Patient Outreach (Signed)
[facility]) Care Management  09/29/2015  KASHANA BREACH 11-Apr-1923 [id number]   REFERRAL SOURCE: EMMI heart failure REFERRAL REASON: EMMI Heart failure red alert ( New worsening problem) CONSENT; Self/patient. Patient also gives verbal consent to speak with her granddaughter [name] regarding all of her medical information.  SUBJECTIVE:  Telephone call to patient regarding EMMI heart failure red alert notification. Patient currently enrolled in [facility] care management heart failure program.  HIPAA verified with patient. Patient states she is doing pretty good. Patient states she thinks the automated service did not hear her answers well. Patient denies having any new symptoms. Patient denies any unusual shortness of breath or swelling. Patient states she continues to take her fluid pill daily which has helped with her swelling. Patient states her weight has been stable. Patient states today's weight is 190 lbs. Patient states she has not been having anymore problems with her heart racing. Patient states she has noticed that she is able to walk further without becoming short of breath.  Patient denies having to use a cane or walker. Patient denies any falls. RNCM advised patient to use her cane since she has it for additional security and fall prevention. Fall prevention discussed with patient.  Patient verbalized understanding and agreement.  Patient verbally agreed to next telephone follow up with RNCM.   ASSESSMENT; RNCM follow up regarding congestive heart failure.  PLAN; RNCM will follow up with patient at next scheduled outreach.  George Ina RN,BSN,CCM [facility] Telephonic  [phone]

## 2015-09-30 ENCOUNTER — Other Ambulatory Visit: Payer: Self-pay

## 2015-09-30 DIAGNOSIS — M810 Age-related osteoporosis without current pathological fracture: Secondary | ICD-10-CM | POA: Diagnosis not present

## 2015-09-30 DIAGNOSIS — I482 Chronic atrial fibrillation: Secondary | ICD-10-CM | POA: Diagnosis not present

## 2015-09-30 DIAGNOSIS — I5033 Acute on chronic diastolic (congestive) heart failure: Secondary | ICD-10-CM | POA: Diagnosis not present

## 2015-09-30 DIAGNOSIS — Z950 Presence of cardiac pacemaker: Secondary | ICD-10-CM | POA: Diagnosis not present

## 2015-09-30 DIAGNOSIS — K219 Gastro-esophageal reflux disease without esophagitis: Secondary | ICD-10-CM | POA: Diagnosis not present

## 2015-09-30 DIAGNOSIS — I110 Hypertensive heart disease with heart failure: Secondary | ICD-10-CM | POA: Diagnosis not present

## 2015-09-30 DIAGNOSIS — E785 Hyperlipidemia, unspecified: Secondary | ICD-10-CM | POA: Diagnosis not present

## 2015-09-30 DIAGNOSIS — E669 Obesity, unspecified: Secondary | ICD-10-CM | POA: Diagnosis not present

## 2015-09-30 DIAGNOSIS — I2510 Atherosclerotic heart disease of native coronary artery without angina pectoris: Secondary | ICD-10-CM | POA: Diagnosis not present

## 2015-09-30 NOTE — Patient Outreach (Signed)
[facility]) Care Management  09/30/2015  [name] [birth year] [id number]   REFERRAL SOURCE: EMMI heart failure REFERRAL REASON: EMMI Heart failure red alert ( New worsening problem) CONSENT; Self/patient. Patient also gives verbal consent to speak with her granddaughter [name] regarding all of her medical information.   Telephone call to patient regarding EMMI congestive heart failure red alert.  Unable to reach patient.  HIPAA compliant voice message left with call back phone number.   PLAN:  RNCM will attempt follow up with patient within 1 week.   George Ina RN,BSN,CCM [facility] Telephonic  [phone]

## 2015-10-01 ENCOUNTER — Other Ambulatory Visit: Payer: Self-pay

## 2015-10-01 NOTE — Patient Outreach (Signed)
[facility]) Care Management  10/01/2015  [name] [birth year] [id number]  REFERRAL SOURCE: EMMI heart failure REFERRAL REASON: EMMI Heart failure red alert ( New worsening problem) CONSENT; Self/patient. Patient also gives verbal consent to speak with her granddaughter [name] regarding all of her medical information.   SUBJECTIVE: Telephone call to patient regarding EMMI heart failure red alert.  HIPAA verified with patient. Patient states she continues to do the same. Denies any unusual shortness of breath or swelling at this time. Patient states her morning weight was 190.4 lbs. Patient states sometimes her weight will be 193 lbs in the evening.  Patient states she has a little more swelling in her feet in the evening.  Patient denies any increased shortness of breath in the evening. Patient states she continues to take her lasix daily. Patient states she is adhering to her low salt/ no salt diet. Patient states she elevates her feet throughout the day when she sits in her recliner. RNCM advised patient to let her primary MD know of her increase weight gain in the evening hours.  Advised patient to continue to adhere to low/no salt diet. Advised patient to continue to take her medications as prescribed. Reviewed with patient signs/ symptoms of heart failure and when to notify her doctor.  Patient reports that Human nurse came to see her on yesterday and checked her blood pressure, lungs, and heart.  Patient states the nurse said she was doing good. Patient states he pulse was around 80 when the nurse checked it on yesterday 09/30/15.  Patient denies any further concerns or needs at this time.  Patient agreed to next follow up visit with RNCM  ASSESSMENT: EMMI heart failure notification follow up.  Patient needs continued heart failure education and disease management.  Care plan ongoing.   PLAN; RNCM will follow up with patient at next scheduled outreach.   George Ina RN,BSN,CCM [facility] Telephonic  [phone]

## 2015-10-06 ENCOUNTER — Other Ambulatory Visit: Payer: Self-pay

## 2015-10-06 NOTE — Patient Outreach (Signed)
[facility]) Care Management  10/06/2015  [name] 10-30-1923 [id number]  REFERRAL SOURCE: EMMI heart failure REFERRAL REASON: EMMI Heart failure red alert ( New worsening problem)/  [facility] Care Management HEART FAILURE PROGRAM.  CONSENT; Self/patient. Patient also gives verbal consent to speak with her granddaughter [name] regarding all of her medical information.   SUBJECTIVE: Telephone call to patient for follow up and EMMI red alert. EMMI red alert for new worsening problem.  HIPAA verified with patient. Patient states she is not experiencing any problems. Patient states she must have not understood the question on the automated call. Patient states she is doing fine. Patient reports her weight today 191 lbs. Patient denies any shortness of breath and / or swelling. Patient states she took her lasix today. Patient reports she is taking all of her medications as directed by her doctor. Patient states she received the education article on fall prevention.  Patient states she does not recall if she received the [facility] care management brochure with consent. Patient reports she was suppose to see Dr. Gala Romney on 10/08/15. Patient states she had to cancel her appointment with Dr. Gala Romney.  Patient states her daughters are taking her to the beach for her birthday on tomorrow.  Patient states she will reschedule the appointment with Dr. Gala Romney after returning.  RNCM advised patient to continue to take her lasix and all other medication as prescribed.  RNCM advised patient to adhere to no salt diet.  RNCM recommended to patient to take her scale with her on her beach trip.  RNCM reviewed fall safety prevention with patient.  RNCM reviewed heart failure yellow zone RNCM advised patient to review [facility] care management with packet once received and complete consent form and return to [facility] care management.  Patient reports she and her daughters know how to contact her doctor after  hours and while she is away on vacation. Patient verbally agreed to next telephone outreach with [facility] .   ASSESSMENT; Patient continues to manage her care with the assistance of her granddaughter and daughters.  Care plan update  PLAN; RNCM will follow up with patient within 1 months.  Patient will report she has received the [facility] care management packet and return  Patient will be able to report 3 signs of heart failure from the yellow zone/action plan.   George Ina RN,BSN,CCM [facility] Telephonic  [phone]

## 2015-10-08 ENCOUNTER — Encounter (HOSPITAL_COMMUNITY): Payer: Commercial Managed Care - HMO | Admitting: Internal Medicine

## 2015-10-08 ENCOUNTER — Ambulatory Visit: Payer: Self-pay

## 2015-10-14 DIAGNOSIS — M810 Age-related osteoporosis without current pathological fracture: Secondary | ICD-10-CM | POA: Diagnosis not present

## 2015-10-14 DIAGNOSIS — E785 Hyperlipidemia, unspecified: Secondary | ICD-10-CM | POA: Diagnosis not present

## 2015-10-14 DIAGNOSIS — Z950 Presence of cardiac pacemaker: Secondary | ICD-10-CM | POA: Diagnosis not present

## 2015-10-14 DIAGNOSIS — I110 Hypertensive heart disease with heart failure: Secondary | ICD-10-CM | POA: Diagnosis not present

## 2015-10-14 DIAGNOSIS — K219 Gastro-esophageal reflux disease without esophagitis: Secondary | ICD-10-CM | POA: Diagnosis not present

## 2015-10-14 DIAGNOSIS — I482 Chronic atrial fibrillation: Secondary | ICD-10-CM | POA: Diagnosis not present

## 2015-10-14 DIAGNOSIS — I5033 Acute on chronic diastolic (congestive) heart failure: Secondary | ICD-10-CM | POA: Diagnosis not present

## 2015-10-14 DIAGNOSIS — I2510 Atherosclerotic heart disease of native coronary artery without angina pectoris: Secondary | ICD-10-CM | POA: Diagnosis not present

## 2015-10-14 DIAGNOSIS — E669 Obesity, unspecified: Secondary | ICD-10-CM | POA: Diagnosis not present

## 2015-11-03 DIAGNOSIS — I4891 Unspecified atrial fibrillation: Secondary | ICD-10-CM | POA: Diagnosis not present

## 2015-11-03 DIAGNOSIS — Z6835 Body mass index (BMI) 35.0-35.9, adult: Secondary | ICD-10-CM | POA: Diagnosis not present

## 2015-11-05 ENCOUNTER — Ambulatory Visit: Payer: Self-pay

## 2015-11-09 ENCOUNTER — Other Ambulatory Visit: Payer: Self-pay

## 2015-11-09 NOTE — Patient Outreach (Signed)
[facility]) Care Management  11/09/2015  [name] [birth year] [id number]  REFERRAL SOURCE: EMMI heart failure REFERRAL REASON: EMMI Heart failure red alert ( New worsening problem)/  [facility] Care Management HEART FAILURE PROGRAM.  CONSENT; Self/patient. Patient also gives verbal consent to speak with her granddaughter [name] regarding all of her medical information.  Telephone call to patient regarding heart failure program. Unable to reach patient.  Phone rang busy.    PLAN:  RNCM will attempt 2nd telephone outreach to patient within 2 weeks.  George Ina RN,BSN,CCM [facility] Telephonic  [phone]

## 2015-11-10 ENCOUNTER — Other Ambulatory Visit: Payer: Self-pay

## 2015-11-10 DIAGNOSIS — I4891 Unspecified atrial fibrillation: Secondary | ICD-10-CM | POA: Diagnosis not present

## 2015-11-10 DIAGNOSIS — R7303 Prediabetes: Secondary | ICD-10-CM | POA: Diagnosis not present

## 2015-11-10 DIAGNOSIS — R109 Unspecified abdominal pain: Secondary | ICD-10-CM | POA: Diagnosis not present

## 2015-11-10 DIAGNOSIS — E785 Hyperlipidemia, unspecified: Secondary | ICD-10-CM | POA: Diagnosis not present

## 2015-11-10 DIAGNOSIS — R110 Nausea: Secondary | ICD-10-CM | POA: Diagnosis not present

## 2015-11-10 NOTE — Patient Outreach (Signed)
[facility]) Care Management  11/10/2015  [name] 08/21/1923 [id number]  REFERRAL SOURCE: EMMI heart failure REFERRAL REASON: EMMI Heart failure red alert ( New worsening problem)/  [facility] Care Management HEART FAILURE PROGRAM.  CONSENT; Self/patient. Patient also gives verbal consent to speak with her granddaughter [name] regarding all of her medical information  PROVIDERS:  Dr. Charlott Rakes - Primary MD  Dr. Odessa Fleming - cardiology Dr. Gala Romney  SUBJECTIVE:  Telephone call to patient for follow up.  HIPAA verified with patient.  Patient states she is doing pretty good today. Patient states she has bee having problems with being nauseated.  Patient states she has not vomited but states she at times becomes very nauseated. Patient states the nausea has not been bad today.  Patient states she saw her primary MD today and discussed the nausea.  Patient states her doctor had her get blood work and the office will follow up with her. Patient states her next appointment date with her doctor  Is in December 2017. Patient denies any abdominal pain or indigestion. Patient states she took magnesium citrate on yesterday because she was having some trouble with her bowels. Patient states she had a good bowel movement on yesterday evening. Patient states her appetite is pretty good.  Patient denies any swelling in her legs/ feet.  Patient denies any shortness of breath.  Patient reports her weight is 191 lbs.  Patient verbalized symptoms of heart failure: shortness of breath, swelling, and weight gain.  RNCM reinforced with patient that doctor should be called with these symptoms.   Patient states she has a follow up appointment with her cardiologist this month, October 2017.  Patient states she has not had a flu shot this season yet. Patient states she did discuss with her primary MD.  Patient reports she found her [facility] care management packet. Patient states she will have her  granddaughter help her with getting the consent for completed and return to [facility] care management office.  Patient verbally agreed to next telephone outreach call with RNCM.   Assessment:  Ongoing heart failure/ atrial fibrillation education  PLAN:  RNCM will follow up with patient within 1 month RNCM will send patient EMMI education material on atrial fib Patient will complete consent form and return to [facility] care management office.   George Ina RN,BSN,CCM [facility] Telephonic  [phone]

## 2015-11-18 ENCOUNTER — Ambulatory Visit (HOSPITAL_COMMUNITY)
Admission: RE | Admit: 2015-11-18 | Discharge: 2015-11-18 | Disposition: A | Discharge status: Home / Self Care | Payer: Commercial Managed Care - HMO | Source: Ambulatory Visit | Attending: Internal Medicine | Admitting: Internal Medicine

## 2015-11-18 DIAGNOSIS — I4891 Unspecified atrial fibrillation: Secondary | ICD-10-CM | POA: Insufficient documentation

## 2015-12-07 ENCOUNTER — Ambulatory Visit (HOSPITAL_COMMUNITY)
Admission: RE | Admit: 2015-12-07 | Discharge: 2015-12-07 | Disposition: A | Discharge status: Home / Self Care | Payer: Commercial Managed Care - HMO | Source: Ambulatory Visit | Attending: Internal Medicine | Admitting: Internal Medicine

## 2015-12-07 VITALS — BP 134/78 | HR 106 | Wt 191.5 lb

## 2015-12-07 DIAGNOSIS — I480 Paroxysmal atrial fibrillation: Secondary | ICD-10-CM | POA: Diagnosis not present

## 2015-12-07 DIAGNOSIS — I482 Chronic atrial fibrillation: Secondary | ICD-10-CM | POA: Insufficient documentation

## 2015-12-07 DIAGNOSIS — I5032 Chronic diastolic (congestive) heart failure: Secondary | ICD-10-CM

## 2015-12-07 NOTE — Patient Instructions (Signed)
We will contact you in 4 months to schedule your next appointment.  

## 2015-12-07 NOTE — Progress Notes (Signed)
Advanced Heart Failure Clinic Note   Patient ID: ESBEIDY MCLAINE, female   DOB: [birth year], 82 y.o.   MRN: [id number]  HPI: Ms. Chism is a delightful 82 year old woman with a history of diastolic HF, chronic atrial fibrillation c/b tachybrady syndrome s/p pacemaker placement in 2010, chronic dyspnea as well as hypertension and gastroesophageal reflux disease.  09/2010   L Main normal LAD mild narrowing in the proximal and midvessel. The first diagonal branch has a 40% ostial stenosis. The left circumflex coronary artery has mild irregularities up to 10- 20%. The right coronary artery is a dominant vessel. It has mild wall irregularities less than or equal to 10%.   Has chronic AF, she saw Dr. Graciela Husbands recently and he noted that nearly 40% of her rates were > 110. Suggested amiodarone vs AV node ablation. We went with amiodarone and she feels much better. No more palpitations.  Follow up:  She presents today for routine f/u. Feels pretty good. She takes lasix most days but not every day. Weight relatively stable. Chronic DOE which is stable. No orthopnea or PND. As above, palpitations much improved.  Labs 09/26/12: K 3.5, creatinine 0.84, proBNP 1265          10/09/12: K 3.5, Cr 0.83, AST 21, ALT 11  ECHO 07/2009: EF 55-60%, mild MR, LA mod dilated, RA mild dilated ECHO 10/14: EF 55-60%, biatrial enlargement, normal RV size/systolic function, PA systolic pressure 39 mmHg.  ECHO 7/17:  EF 55-60% RVSP 39 biatrial enlargement.   SH: Lives in [place] with her husband, nonsmoker.   ROS: All systems negative except as listed in HPI, PMH and Problem List.  Past Medical History:  Diagnosis Date  . Anticoagulation goal of INR 2 to 3 08/21/2015  . Arthritis    oa  . Balance problem   . Chronic diastolic CHF (congestive heart failure) (HCC)   . Dyspnea   . GERD (gastroesophageal reflux disease)   . Hyperlipidemia   . Hypertension   . Hypotensive episode 08/21/2015  . Mild CAD    a. nonobst CAD by  cath 09/2010.  . Obesity   . Osteoporosis   . Pacemaker-[employer]   . Permanent atrial fibrillation (HCC)    a. c/b tachy-brady syndrome s/p PPM 2010.  [name] Stroke [facility]) 07/2013   Current Outpatient Prescriptions  Medication Sig Dispense Refill  . amiodarone (PACERONE) 200 MG tablet Take 1 tablet (200 mg total) by mouth 2 (two) times daily. 60 tablet 3  . calcium carbonate (TUMS) 500 MG chewable tablet Chew 1 tablet by mouth daily as needed for indigestion or heartburn.    . carbidopa-levodopa (SINEMET CR) 50-200 MG tablet Take 1 tablet by mouth 2 (two) times daily.    . furosemide (LASIX) 20 MG tablet Take 20 mg by mouth daily.    . hydroxychloroquine (PLAQUENIL) 200 MG tablet Take 200 mg by mouth 2 (two) times daily.    . metoprolol (LOPRESSOR) 50 MG tablet Take 1 tablet (50 mg total) by mouth 2 (two) times daily.    [name] omeprazole (PRILOSEC) 20 MG capsule Take 20 mg by mouth daily.      [name] rOPINIRole (REQUIP) 0.5 MG tablet Take 0.5 mg by mouth at bedtime.    [name] warfarin (COUMADIN) 3 MG tablet Take 3 mg by mouth daily.      No current facility-administered medications for this encounter.    Vitals:   12/07/15 1447  BP: 134/78  Pulse: (!) 106  SpO2: 96%  Weight:  191 lb 8 oz (86.9 kg)   Filed Weights   12/07/15 1447  Weight: 191 lb 8 oz (86.9 kg)    PHYSICAL EXAM: General:  Elderly +diffuse  tremor. No acute distress.  HEENT: normal;  Neck: supple. JVD 6-7 . Carotids 2+ bilat; no bruits. No lymphadenopathy or thryomegaly appreciated. Cor: PMI nondisplaced. Irregular rate & rhythm. No rubs, gallops, murmur. Back + scoliosis/kyphosis. Lungs: clear Abdomen: soft, nontender,  nondistended.  Good bowel sounds. Extremities: no cyanosis, clubbing, rash, trace ankle edema  Neuro: alert & orientedx3, cranial nerves grossly intact. moves all 4 extremities w/o difficulty  ASSESSMENT & PLAN:  1) Chronic Diastolic HF, EF 55-60% - Volume status stable.  Reminded her to take lasix every day   Reinforced need for daily weights and reviewed use of sliding scale diuretics. 2) Chronic A-fib with elevated rates - Rates improved with amio.  3) HTN -Blood pressure well controlled. Continue current regimen. 4) CVA S/P 2015. -on coumadin, and statin    Addendum:   ECG with AF with VR 83 bpm.   Bensimhon, Daniel,MD 10:47 PM

## 2015-12-09 ENCOUNTER — Other Ambulatory Visit: Payer: Self-pay

## 2015-12-09 NOTE — Patient Outreach (Signed)
[facility]) Care Management  12/09/2015  [name] February 08, 1923 [id number]    REFERRAL SOURCE: EMMI heart failure REFERRAL REASON: EMMI Heart failure red alert ( New worsening problem)/ [facility] Care Management HEART FAILURE PROGRAM.  CONSENT; Self/patient. Patient also gives verbal consent to speak with her granddaughter [name] regarding all of her medical information  PROVIDERS:  Dr. Charlott Rakes - Primary MD  Dr. Odessa Fleming - cardiology Dr. Gala Romney  Telephone call to patient for follow up.  Unable to reach patient or leave voice message. Message states to enter access code.   PLAN; RNCM will attempt 2nd telephone outreach to patient within  1 week.   George Ina RN,BSN,CCM [facility] Telephonic  [phone]

## 2015-12-11 ENCOUNTER — Other Ambulatory Visit: Payer: Self-pay

## 2015-12-11 NOTE — Patient Outreach (Signed)
[facility]) Care Management  12/11/2015  [name] [birth year] [id number]    REFERRAL SOURCE: EMMI heart failure REFERRAL REASON: EMMI Heart failure  [facility] Care Management HEART FAILURE PROGRAM.  CONSENT; Self/patient. Patient also gives verbal consent to speak with her granddaughter [name] regarding all of her medical information  PROVIDERS:  Dr. Charlott Rakes - Primary MD  Dr. Odessa Fleming - cardiology Dr. Gala Romney  Telephone call to patient for follow up.  Unable to reach patient or leave voice message. Message states to enter access code.   PLAN; RNCM will attempt 3rd telephone outreach to patient within  1 week.  George Ina RN,BSN,CCM [facility] Telephonic  [phone]

## 2015-12-15 ENCOUNTER — Other Ambulatory Visit: Payer: Self-pay

## 2015-12-15 NOTE — Patient Outreach (Addendum)
[facility]) Care Management  12/15/2015  [name] 06-22-1923 [id number]   SUBJECTIVE; Telephone call to patient.  HIPAA verified with patient. Patient states she is not feeling the best today. Patient states she has good and bad days.  Patient reports her weight today at 189 lbs.  Patient states she does have some swelling in her feet/ ankles.  Patient states she has taken her lasix this morning.  Patient states she went to the beach this past weekend with her daughter. States she was getting ready to walk up the step and fell. Patient states she missed her step on the second step.  Patient states she skinned her right leg.  Patient states the area is still red and sore.  Patient states the step had hand rails.  Patient states she has an appointment to see her primary MD on tomorrow, 12/16/15 regarding her leg.  RNCM discussed fall prevention with patient. Patient verbalized understanding.  Patient states has good support.  States her granddaughter is a Engineer, civil (consulting) and she and patients daughter were with her at the time of her fall.  Patient states she saw Dr. Gala Romney on 12/07/15. Patient states Dr. Gala Romney said she was doing good. Denies any changes in her current treatment.  RNCM informed patient she would contact her within 1 week to follow up on progress of patients right leg.  Patient verbalized understanding. Patient verbally agreed to next outreach with RNCM.  ASSESSMENT: Recent fall.  Fall prevention discussed. Patient has appointment scheduled with her primary on 12/16/15 to evaluate fall injury.  Falls and nutrition assessment completed  PLAN: RNCM will follow up  With patient within  1 week. RNCM will send quarterly update to patients primary MD.  George Ina RN,BSN,CCM [facility] Telephonic  [phone]

## 2015-12-16 DIAGNOSIS — S81801A Unspecified open wound, right lower leg, initial encounter: Secondary | ICD-10-CM | POA: Diagnosis not present

## 2015-12-16 DIAGNOSIS — I4891 Unspecified atrial fibrillation: Secondary | ICD-10-CM | POA: Diagnosis not present

## 2015-12-16 DIAGNOSIS — Z6834 Body mass index (BMI) 34.0-34.9, adult: Secondary | ICD-10-CM | POA: Diagnosis not present

## 2016-01-05 DIAGNOSIS — I4891 Unspecified atrial fibrillation: Secondary | ICD-10-CM | POA: Diagnosis not present

## 2016-01-12 ENCOUNTER — Other Ambulatory Visit: Payer: Self-pay

## 2016-01-12 DIAGNOSIS — I4891 Unspecified atrial fibrillation: Secondary | ICD-10-CM | POA: Diagnosis not present

## 2016-01-12 DIAGNOSIS — Z6834 Body mass index (BMI) 34.0-34.9, adult: Secondary | ICD-10-CM | POA: Diagnosis not present

## 2016-01-12 NOTE — Patient Outreach (Signed)
[facility]) Care Management  01/12/2016  [name] 10-25-1923 [id number]   REFERRAL SOURCE: EMMI heart failure REFERRAL REASON:  Heart failure  [facility] Care Management HEART FAILURE PROGRAM.  CONSENT; Self/patient. Patient also gives verbal consent to speak with her granddaughter [name] regarding all of her medical information  PROVIDERS:  Dr. Maryella Shivers - Primary MD  Dr. Olin Pia - cardiology Dr. Haroldine Laws  CASE CLOSURE: Telephone call to patient for telephone assessment follow up. HIPAA verified with patient. Patient states she is doing pretty good. Patient states her right leg still hurt some. States there is a scab forming over the scraped area.  Patient states her primary MD put her on antibiotics for 10 days which she has completed.  Patient  States she continues to monitor her weight daily.  Patient states her weight is 188 lbs. Patient states she has some swelling in her feet.  Patient states she elevates her legs several times during the day.  Patient states she continues to take her lasix daily..  Patient denies having shortness of breath. . Patient states she has a follow up appointment with Dr. Haroldine Laws scheduled. Patient states she is unsure of the date at this moment. Patient states she has a pacemaker check scheduled in January. Patient reports she is in the Antoino Westhoff zone today of the heart failure action plan.   Patient verbalizes understanding of heart failure symptoms and when to contact her doctor for symptoms.  Patient denies any recent falls. Patient states her medication for atrial fibrillation continues to work well. States she has not had any additional symptoms.  Patient states she is aware the symptoms of atrial fibrillation is her heart beating really fast,  Tired, pain in your chest.  Patient advised to contact doctor for symptoms.  If symptoms worsen advised to call 911.  Patient verbalizes agreement with closure to [facility] care  management services due to meeting her goals. Patient confirms she has contact phone number for [facility] care management and Nurse advise call line.  Patient denies any further needs.   ASSESSMENT: Patient self managing with the assistance of her family. Patient met goals with [facility] care management.  PLAN: RNCM will refer patient to case management assistant to close case due to patient meeting her goals. RNCM will notify patients primary MD of closure.   Quinn Plowman RN,BSN,CCM [facility] Telephonic  [phone]

## 2016-01-18 DIAGNOSIS — Z6834 Body mass index (BMI) 34.0-34.9, adult: Secondary | ICD-10-CM | POA: Diagnosis not present

## 2016-01-18 DIAGNOSIS — I4891 Unspecified atrial fibrillation: Secondary | ICD-10-CM | POA: Diagnosis not present

## 2016-01-26 DIAGNOSIS — I4891 Unspecified atrial fibrillation: Secondary | ICD-10-CM | POA: Diagnosis not present

## 2016-01-26 DIAGNOSIS — Z6834 Body mass index (BMI) 34.0-34.9, adult: Secondary | ICD-10-CM | POA: Diagnosis not present

## 2016-02-02 DIAGNOSIS — I4891 Unspecified atrial fibrillation: Secondary | ICD-10-CM | POA: Diagnosis not present

## 2016-02-02 DIAGNOSIS — Z6835 Body mass index (BMI) 35.0-35.9, adult: Secondary | ICD-10-CM | POA: Diagnosis not present

## 2016-02-09 DIAGNOSIS — I4891 Unspecified atrial fibrillation: Secondary | ICD-10-CM | POA: Diagnosis not present

## 2016-02-16 DIAGNOSIS — Z6835 Body mass index (BMI) 35.0-35.9, adult: Secondary | ICD-10-CM | POA: Diagnosis not present

## 2016-02-16 DIAGNOSIS — I4891 Unspecified atrial fibrillation: Secondary | ICD-10-CM | POA: Diagnosis not present

## 2016-03-02 DIAGNOSIS — I4891 Unspecified atrial fibrillation: Secondary | ICD-10-CM | POA: Diagnosis not present

## 2016-03-29 DIAGNOSIS — R42 Dizziness and giddiness: Secondary | ICD-10-CM | POA: Diagnosis not present

## 2016-03-29 DIAGNOSIS — Z7901 Long term (current) use of anticoagulants: Secondary | ICD-10-CM | POA: Diagnosis not present

## 2016-03-29 DIAGNOSIS — I4891 Unspecified atrial fibrillation: Secondary | ICD-10-CM | POA: Diagnosis not present

## 2016-03-29 DIAGNOSIS — Z6834 Body mass index (BMI) 34.0-34.9, adult: Secondary | ICD-10-CM | POA: Diagnosis not present

## 2016-03-30 DIAGNOSIS — Z1389 Encounter for screening for other disorder: Secondary | ICD-10-CM | POA: Diagnosis not present

## 2016-03-30 DIAGNOSIS — Z139 Encounter for screening, unspecified: Secondary | ICD-10-CM | POA: Diagnosis not present

## 2016-03-30 DIAGNOSIS — Z0000 Encounter for general adult medical examination without abnormal findings: Secondary | ICD-10-CM | POA: Diagnosis not present

## 2016-04-15 ENCOUNTER — Telehealth: Payer: Self-pay

## 2016-04-15 NOTE — Telephone Encounter (Signed)
Spoke with patient regarding new monitor. Patient aware.

## 2016-04-26 ENCOUNTER — Ambulatory Visit (INDEPENDENT_AMBULATORY_CARE_PROVIDER_SITE_OTHER): Payer: Medicare HMO | Admitting: *Deleted

## 2016-04-26 DIAGNOSIS — Z950 Presence of cardiac pacemaker: Secondary | ICD-10-CM | POA: Diagnosis not present

## 2016-04-26 DIAGNOSIS — I482 Chronic atrial fibrillation, unspecified: Secondary | ICD-10-CM

## 2016-04-26 LAB — CUP PACEART INCLINIC DEVICE CHECK
Brady Statistic RV Percent Paced: 8.3 %
Implantable Lead Implant Date: 20100616
Implantable Lead Implant Date: 20100616
Implantable Lead Location: 753859
Implantable Lead Model: 1948
Lead Channel Impedance Value: 687.5 Ohm
Lead Channel Pacing Threshold Pulse Width: 0.4 ms
Lead Channel Setting Sensing Sensitivity: 2 mV
MDC IDC LEAD LOCATION: 753860
MDC IDC MSMT BATTERY VOLTAGE: 2.93 V
MDC IDC MSMT LEADCHNL RV PACING THRESHOLD AMPLITUDE: 0.75 V
MDC IDC MSMT LEADCHNL RV SENSING INTR AMPL: 12 mV
MDC IDC PG IMPLANT DT: 20100616
MDC IDC PG SERIAL: 2285053
MDC IDC SESS DTM: 20180327165918
MDC IDC SET LEADCHNL RV PACING AMPLITUDE: 2 V
MDC IDC SET LEADCHNL RV PACING PULSEWIDTH: 0.4 ms
MDC IDC STAT BRADY RA PERCENT PACED: 0 %

## 2016-04-26 NOTE — Progress Notes (Signed)
Pacemaker check in clinic. Normal device function. Thresholds, sensing, impedances consistent with previous measurements. Device programmed to maximize longevity. Chronic AF +warfarin and amiodarone. 20 high ventricular rates noted--irregular R-R interval, likely AF w/RVR. Device programmed at appropriate safety margins. Histogram distribution indicates poor rate control, HR >100bpm ~35% of the time. Device programmed to optimize intrinsic conduction. Estimated longevity 9.4-10.3 years. Patient enrolled in remote follow-up, but needs new inductive transmitter. Will set up industry to bring new monitor to patient's appointment on 05/03/16 with Dr. Gala Romney. Patient education completed. Merlin on 07/26/16 and ROV with SK/B in 08/2016.  Patient reports feeling better since starting on amiodarone, but still notes intermittent episodes of fast HRs.  She notes increasing LE edema x1 week, despite being compliant with furosemide this week and urinating frequently.  Patient and daughter note dyspnea with ambulation, but not at rest or when lying down.  She does not weigh daily.  Patient denies chest discomfort or palpitations.    Spoke with Meredith Staggers, RN, and scheduled patient for appointment with Dr. Gala Romney on 05/03/16 at 3:20pm (per 43m f/u recall).  Patient instructed to take additional 20mg  furosemide today (per , RN) and to call the HF Clinic if no improvement or worsening symptoms prior to 4/3.  Patient and daughter verbalize understanding and deny additional questions or concerns.  Will route to Dr. 6/3 and Dr. Graciela Husbands for review.

## 2016-04-28 ENCOUNTER — Other Ambulatory Visit (HOSPITAL_COMMUNITY): Payer: Self-pay | Admitting: Internal Medicine

## 2016-05-03 ENCOUNTER — Ambulatory Visit (HOSPITAL_COMMUNITY)
Admission: RE | Admit: 2016-05-03 | Discharge: 2016-05-03 | Disposition: A | Discharge status: Home / Self Care | Payer: Medicare HMO | Source: Ambulatory Visit | Attending: Internal Medicine | Admitting: Internal Medicine

## 2016-05-03 ENCOUNTER — Encounter (HOSPITAL_COMMUNITY): Payer: Self-pay | Admitting: Internal Medicine

## 2016-05-03 VITALS — BP 150/92 | HR 104 | Wt 189.8 lb

## 2016-05-03 DIAGNOSIS — I482 Chronic atrial fibrillation, unspecified: Secondary | ICD-10-CM

## 2016-05-03 DIAGNOSIS — I2510 Atherosclerotic heart disease of native coronary artery without angina pectoris: Secondary | ICD-10-CM | POA: Diagnosis not present

## 2016-05-03 DIAGNOSIS — E669 Obesity, unspecified: Secondary | ICD-10-CM | POA: Diagnosis not present

## 2016-05-03 DIAGNOSIS — Z8673 Personal history of transient ischemic attack (TIA), and cerebral infarction without residual deficits: Secondary | ICD-10-CM | POA: Insufficient documentation

## 2016-05-03 DIAGNOSIS — Z7901 Long term (current) use of anticoagulants: Secondary | ICD-10-CM | POA: Insufficient documentation

## 2016-05-03 DIAGNOSIS — E785 Hyperlipidemia, unspecified: Secondary | ICD-10-CM | POA: Insufficient documentation

## 2016-05-03 DIAGNOSIS — K219 Gastro-esophageal reflux disease without esophagitis: Secondary | ICD-10-CM | POA: Diagnosis not present

## 2016-05-03 DIAGNOSIS — Z950 Presence of cardiac pacemaker: Secondary | ICD-10-CM | POA: Diagnosis not present

## 2016-05-03 DIAGNOSIS — I10 Essential (primary) hypertension: Secondary | ICD-10-CM | POA: Diagnosis not present

## 2016-05-03 DIAGNOSIS — G250 Essential tremor: Secondary | ICD-10-CM | POA: Insufficient documentation

## 2016-05-03 DIAGNOSIS — I5032 Chronic diastolic (congestive) heart failure: Secondary | ICD-10-CM

## 2016-05-03 DIAGNOSIS — Z6834 Body mass index (BMI) 34.0-34.9, adult: Secondary | ICD-10-CM | POA: Diagnosis not present

## 2016-05-03 DIAGNOSIS — I110 Hypertensive heart disease with heart failure: Secondary | ICD-10-CM | POA: Insufficient documentation

## 2016-05-03 LAB — COMPREHENSIVE METABOLIC PANEL
ALBUMIN: 3.4 g/dL — AB (ref 3.5–5.0)
ALT: 9 U/L — ABNORMAL LOW (ref 14–54)
ANION GAP: 13 (ref 5–15)
AST: 27 U/L (ref 15–41)
Alkaline Phosphatase: 96 U/L (ref 38–126)
BILIRUBIN TOTAL: 0.8 mg/dL (ref 0.3–1.2)
BUN: 16 mg/dL (ref 6–20)
CHLORIDE: 105 mmol/L (ref 101–111)
CO2: 22 mmol/L (ref 22–32)
Calcium: 9 mg/dL (ref 8.9–10.3)
Creatinine, Ser: 1.1 mg/dL — ABNORMAL HIGH (ref 0.44–1.00)
GFR calc Af Amer: 50 mL/min — ABNORMAL LOW (ref >60)
GFR calc non Af Amer: 43 mL/min — ABNORMAL LOW (ref >60)
GLUCOSE: 94 mg/dL (ref 65–99)
POTASSIUM: 4.5 mmol/L (ref 3.5–5.1)
SODIUM: 140 mmol/L (ref 135–145)
Total Protein: 6.7 g/dL (ref 6.5–8.1)

## 2016-05-03 LAB — CBC
HEMATOCRIT: 46.7 % — AB (ref 36.0–46.0)
HEMOGLOBIN: 15.3 g/dL — AB (ref 12.0–15.0)
MCH: 32.1 pg (ref 26.0–34.0)
MCHC: 32.8 g/dL (ref 30.0–36.0)
MCV: 98.1 fL (ref 78.0–100.0)
Platelets: 296 10*3/uL (ref 150–400)
RBC: 4.76 MIL/uL (ref 3.87–5.11)
RDW: 15 % (ref 11.5–15.5)
WBC: 9 10*3/uL (ref 4.0–10.5)

## 2016-05-03 LAB — TSH: TSH: 7.874 u[IU]/mL — ABNORMAL HIGH (ref 0.350–4.500)

## 2016-05-03 LAB — T4, FREE: Free T4: 0.77 ng/dL (ref 0.61–1.12)

## 2016-05-03 MED ORDER — AMIODARONE HCL 200 MG PO TABS: 200.0000 mg | ORAL_TABLET | Freq: Every day | ORAL | 3 refills | Status: DC

## 2016-05-03 NOTE — Progress Notes (Signed)
Advanced Heart Failure Clinic Note   Patient ID: [name], female   DOB: 1923-06-06, 83 y.o.   MRN: [id number]  HPI: [name] is a delightful 83 year old woman with a history of diastolic HF, chronic atrial fibrillation c/b tachybrady syndrome s/p pacemaker placement in 2010, chronic dyspnea as well as hypertension and gastroesophageal reflux disease.  09/2010   L Main normal LAD mild narrowing in the proximal and midvessel. The first diagonal branch has a 40% ostial stenosis. The left circumflex coronary artery has mild irregularities up to 10- 20%. The right coronary artery is a dominant vessel. It has mild wall irregularities less than or equal to 10%.   Has chronic AF, she saw Dr. Graciela Husbands in past and he noted that nearly 40% of her rates were > 110. Suggested amiodarone vs AV node ablation. We went with amiodarone and she felt much better.   Follow up:  She presents today for routine f/u. Overall not feeling very well. Not much energy. Tremors are getting worse. Notes that she is more fatigued and SOB with ADLs. Has had LE swelling and took extra torsemide. Now some better. No orthopnea or PND. Gets dizzy frequently especially when standing.    Labs 09/26/12: K 3.5, creatinine 0.84, proBNP 1265          10/09/12: K 3.5, Cr 0.83, AST 21, ALT 11  ECHO 07/2009: EF 55-60%, mild MR, LA mod dilated, RA mild dilated ECHO 10/14: EF 55-60%, biatrial enlargement, normal RV size/systolic function, PA systolic pressure 39 mmHg.  ECHO 7/17:  EF 55-60% RVSP 39 biatrial enlargement.   SH: Lives in [place] with her husband, nonsmoker.   ROS: All systems negative except as listed in HPI, PMH and Problem List.  Past Medical History:  Diagnosis Date  . Anticoagulation goal of INR 2 to 3 08/21/2015  . Arthritis    oa  . Balance problem   . Chronic diastolic CHF (congestive heart failure) (HCC)   . Dyspnea   . GERD (gastroesophageal reflux disease)   . Hyperlipidemia   . Hypertension   .  Hypotensive episode 08/21/2015  . Mild CAD    a. nonobst CAD by cath 09/2010.  . Obesity   . Osteoporosis   . Pacemaker-[employer]   . Permanent atrial fibrillation (HCC)    a. c/b tachy-brady syndrome s/p PPM 2010.  [name] Stroke [facility]) 07/2013   Current Outpatient Prescriptions  Medication Sig Dispense Refill  . amiodarone (PACERONE) 200 MG tablet Take 1 tablet (200 mg total) by mouth 2 (two) times daily. 60 tablet 3  . calcium carbonate (TUMS) 500 MG chewable tablet Chew 1 tablet by mouth daily as needed for indigestion or heartburn.    . carbidopa-levodopa (SINEMET CR) 50-200 MG tablet Take 1 tablet by mouth 2 (two) times daily.    . furosemide (LASIX) 20 MG tablet Take 20 mg by mouth daily.    . hydroxychloroquine (PLAQUENIL) 200 MG tablet Take 200 mg by mouth 2 (two) times daily.    . metoprolol (LOPRESSOR) 50 MG tablet Take 1 tablet (50 mg total) by mouth 2 (two) times daily.    [name] omeprazole (PRILOSEC) 20 MG capsule Take 20 mg by mouth daily.      [name] rOPINIRole (REQUIP) 0.5 MG tablet Take 0.5 mg by mouth at bedtime.    [name] warfarin (COUMADIN) 3 MG tablet Take 3 mg by mouth daily.      No current facility-administered medications for this encounter.    Vitals:  05/03/16 1446  BP: (!) 150/92  Pulse: (!) 104  SpO2: 96%  Weight: 189 lb 12 oz (86.1 kg)   Filed Weights   05/03/16 1446  Weight: 189 lb 12 oz (86.1 kg)    PHYSICAL EXAM: General:  Elderly. Fatigued appearing + severe diffuse  tremor. No acute distress.  HEENT: normal;  Neck: supple. JVD 7. Carotids 2+ bilat; no bruits. No lymphadenopathy or thryomegaly appreciated. Cor: PMI nondisplaced.IRR mildly tachy  Back + severe scoliosis/kyphosis. Lungs: clear no wheeze Abdomen: soft, nontender,  nondistended.  Good BS Extremities: no cyanosis, clubbing, rash, trace -1+ edema L>R  Neuro: alert & orientedx3, cranial nerves grossly intact. Diffuse tremor  ASSESSMENT & PLAN:  1) Chronic Diastolic HF, EF 55-60% - Volume status  seems like it has been up recently but better today.  Will check labs - Reinforced need for daily weights and reviewed use of sliding scale diuretics. 2) Chronic A-fib with elevated rates  - Rates initially initially improved with adding amio but back up slightly. - Given tremors will cut amio back to 200 daily may need to add diltiazem 3) Tremors - She has essential tremor but seems worse on amio. Will cut back to 200 daily  3) HTN -Blood pressure elevated. Will continue to follow. May need diltiazem 4) CVA S/P 2015. -on warfarin & statin   Check labs today. Cut amio. See back in 4weeks   Weslynn Ke,MD 3:17 PM

## 2016-05-03 NOTE — Patient Instructions (Signed)
Labs today (will call for abnormal results, otherwise no news is good news)  DECREASE Amiodarone to 200 mg (1 Tablet) Once Daily  Follow up in 4 weeks

## 2016-05-03 NOTE — Addendum Note (Signed)
Encounter addended by: Suezanne Cheshire, RN on: 05/03/2016  3:34 PM<BR>    Actions taken: Order list changed, Diagnosis association updated, Sign clinical note

## 2016-05-23 DIAGNOSIS — I255 Ischemic cardiomyopathy: Secondary | ICD-10-CM | POA: Diagnosis not present

## 2016-05-23 DIAGNOSIS — I509 Heart failure, unspecified: Secondary | ICD-10-CM | POA: Diagnosis not present

## 2016-05-23 DIAGNOSIS — I4891 Unspecified atrial fibrillation: Secondary | ICD-10-CM | POA: Diagnosis not present

## 2016-05-23 DIAGNOSIS — I495 Sick sinus syndrome: Secondary | ICD-10-CM | POA: Diagnosis not present

## 2016-05-23 DIAGNOSIS — Z7901 Long term (current) use of anticoagulants: Secondary | ICD-10-CM | POA: Diagnosis not present

## 2016-06-07 ENCOUNTER — Encounter (HOSPITAL_COMMUNITY): Payer: Medicare HMO | Admitting: Internal Medicine

## 2016-06-24 ENCOUNTER — Telehealth: Payer: Self-pay | Admitting: *Deleted

## 2016-06-24 NOTE — Telephone Encounter (Signed)
Attempted to reach patient to determine if she ever received a Merlin monitor from [employer] rep.  No VM/answering machine at either number.  Will try again next week.

## 2016-06-30 NOTE — Telephone Encounter (Signed)
Able to reach patient.  She states that she still has not received a monitor.  Advised patient that we will make [employer] rep aware so that a new monitor can be set-up at her upcoming CHF clinic appointment on 07/13/16 at 2:40pm.  Patient verbalizes agreement with this plan and denies additional questions at this time.  Donnald Garre, [employer] rep, made aware.

## 2016-07-13 ENCOUNTER — Ambulatory Visit (HOSPITAL_COMMUNITY)
Admission: RE | Admit: 2016-07-13 | Discharge: 2016-07-13 | Disposition: A | Discharge status: Home / Self Care | Payer: Medicare HMO | Source: Ambulatory Visit | Attending: Internal Medicine | Admitting: Internal Medicine

## 2016-07-13 ENCOUNTER — Encounter (HOSPITAL_COMMUNITY): Payer: Self-pay | Admitting: Internal Medicine

## 2016-07-13 VITALS — BP 140/94 | HR 95 | Wt 188.4 lb

## 2016-07-13 DIAGNOSIS — M810 Age-related osteoporosis without current pathological fracture: Secondary | ICD-10-CM | POA: Insufficient documentation

## 2016-07-13 DIAGNOSIS — G250 Essential tremor: Secondary | ICD-10-CM | POA: Insufficient documentation

## 2016-07-13 DIAGNOSIS — I482 Chronic atrial fibrillation, unspecified: Secondary | ICD-10-CM

## 2016-07-13 DIAGNOSIS — I2510 Atherosclerotic heart disease of native coronary artery without angina pectoris: Secondary | ICD-10-CM | POA: Diagnosis not present

## 2016-07-13 DIAGNOSIS — Z8673 Personal history of transient ischemic attack (TIA), and cerebral infarction without residual deficits: Secondary | ICD-10-CM | POA: Insufficient documentation

## 2016-07-13 DIAGNOSIS — E785 Hyperlipidemia, unspecified: Secondary | ICD-10-CM | POA: Insufficient documentation

## 2016-07-13 DIAGNOSIS — I110 Hypertensive heart disease with heart failure: Secondary | ICD-10-CM | POA: Insufficient documentation

## 2016-07-13 DIAGNOSIS — Z950 Presence of cardiac pacemaker: Secondary | ICD-10-CM

## 2016-07-13 DIAGNOSIS — E669 Obesity, unspecified: Secondary | ICD-10-CM | POA: Diagnosis not present

## 2016-07-13 DIAGNOSIS — K219 Gastro-esophageal reflux disease without esophagitis: Secondary | ICD-10-CM | POA: Diagnosis not present

## 2016-07-13 DIAGNOSIS — Z7901 Long term (current) use of anticoagulants: Secondary | ICD-10-CM | POA: Insufficient documentation

## 2016-07-13 DIAGNOSIS — I5032 Chronic diastolic (congestive) heart failure: Secondary | ICD-10-CM

## 2016-07-13 DIAGNOSIS — I10 Essential (primary) hypertension: Secondary | ICD-10-CM

## 2016-07-13 DIAGNOSIS — Z6834 Body mass index (BMI) 34.0-34.9, adult: Secondary | ICD-10-CM | POA: Insufficient documentation

## 2016-07-13 LAB — BASIC METABOLIC PANEL
Anion gap: 8 (ref 5–15)
BUN: 11 mg/dL (ref 6–20)
CO2: 22 mmol/L (ref 22–32)
CREATININE: 1.1 mg/dL — AB (ref 0.44–1.00)
Calcium: 9.2 mg/dL (ref 8.9–10.3)
Chloride: 105 mmol/L (ref 101–111)
GFR calc non Af Amer: 43 mL/min — ABNORMAL LOW (ref >60)
GFR, EST AFRICAN AMERICAN: 50 mL/min — AB (ref >60)
GLUCOSE: 94 mg/dL (ref 65–99)
Potassium: 4.5 mmol/L (ref 3.5–5.1)
Sodium: 135 mmol/L (ref 135–145)

## 2016-07-13 LAB — BRAIN NATRIURETIC PEPTIDE: B Natriuretic Peptide: 367.4 pg/mL — ABNORMAL HIGH (ref 0.0–100.0)

## 2016-07-13 NOTE — Progress Notes (Signed)
Advanced Heart Failure Clinic Note   Patient ID: [name], female   DOB: [birth year], 83 y.o.   MRN: [id number]  HPI: [name] is a delightful 83 year old woman with a history of diastolic HF, chronic atrial fibrillation c/b tachybrady syndrome s/p pacemaker placement in 2010, chronic dyspnea as well as hypertension and gastroesophageal reflux disease.  09/2010   L Main normal LAD mild narrowing in the proximal and midvessel. The first diagonal branch has a 40% ostial stenosis. The left circumflex coronary artery has mild irregularities up to 10- 20%. The right coronary artery is a dominant vessel. It has mild wall irregularities less than or equal to 10%.   Has chronic AF, she saw Dr. Graciela Husbands in past and he noted that nearly 40% of her rates were > 110. Suggested amiodarone vs AV node ablation. We went with amiodarone and she felt much better.   She returns today for HF follow up. She says she doesn't feel well today, feels like when she takes a deep breath she gets a sharp pain behind her right breast. SOB with walking into clinic, SOB with walking around the house. No weighing herself at home, says she has noticed more edema in her ankles. Eating high salt foods, drinking more than 2L a day. Denies palpitations and chest pain.    Labs 09/26/12: K 3.5, creatinine 0.84, proBNP 1265          10/09/12: K 3.5, Cr 0.83, AST 21, ALT 11  ECHO 07/2009: EF 55-60%, mild MR, LA mod dilated, RA mild dilated ECHO 10/14: EF 55-60%, biatrial enlargement, normal RV size/systolic function, PA systolic pressure 39 mmHg.  ECHO 7/17:  EF 55-60% RVSP 39 biatrial enlargement.   SH: Lives in [place] with her husband, nonsmoker.   ROS: All systems negative except as listed in HPI, PMH and Problem List.  Past Medical History:  Diagnosis Date  . Anticoagulation goal of INR 2 to 3 08/21/2015  . Arthritis    oa  . Balance problem   . Chronic diastolic CHF (congestive heart failure) (HCC)   . Dyspnea   . GERD  (gastroesophageal reflux disease)   . Hyperlipidemia   . Hypertension   . Hypotensive episode 08/21/2015  . Mild CAD    a. nonobst CAD by cath 09/2010.  . Obesity   . Osteoporosis   . Pacemaker-[employer]   . Permanent atrial fibrillation (HCC)    a. c/b tachy-brady syndrome s/p PPM 2010.  [name] Stroke [facility]) 07/2013   Current Outpatient Prescriptions  Medication Sig Dispense Refill  . amiodarone (PACERONE) 200 MG tablet Take 1 tablet (200 mg total) by mouth daily. 30 tablet 3  . calcium carbonate (TUMS) 500 MG chewable tablet Chew 1 tablet by mouth daily as needed for indigestion or heartburn.    . carbidopa-levodopa (SINEMET CR) 50-200 MG tablet Take 1 tablet by mouth 2 (two) times daily.    . furosemide (LASIX) 20 MG tablet Take 20 mg by mouth daily.    . hydroxychloroquine (PLAQUENIL) 200 MG tablet Take 200 mg by mouth 2 (two) times daily.    . metoprolol (LOPRESSOR) 50 MG tablet Take 1 tablet (50 mg total) by mouth 2 (two) times daily.    [name] omeprazole (PRILOSEC) 20 MG capsule Take 20 mg by mouth daily.      [name] rOPINIRole (REQUIP) 0.5 MG tablet Take 0.5 mg by mouth at bedtime.    [name] warfarin (COUMADIN) 3 MG tablet Take 3 mg by mouth daily.  No current facility-administered medications for this encounter.    Vitals:   07/13/16 1444  BP: (!) 140/94  Pulse: 95  SpO2: 95%  Weight: 188 lb 6.4 oz (85.5 kg)   Filed Weights   07/13/16 1444  Weight: 188 lb 6.4 oz (85.5 kg)    PHYSICAL EXAM: General:  Elderly female, NAD.  HEENT: normal  Neck: supple. 9-10 cm JVD Carotids 2+ bilat; no bruits. No lymphadenopathy or thryomegaly appreciated. Cor: PMI nondisplaced. Irregularly irregular rhythm.  Back: Severe scoliosis and kyphosis Lungs: Clear bilaterally. Normal effort.  Abdomen: soft, nontender,  nondistended. Bowel sounds present.  Extremities: no cyanosis, clubbing, rash, Trace edema  Neuro: alert & orientedx3, cranial nerves grossly intact. Diffuse tremor  ASSESSMENT &  PLAN:  1) Chronic Diastolic HF: Echo July 2017 with EF 55-60% - NYHA III - Volume status elevated on exam.  - Increase lasix to 40mg  daily for the next 3 days.  - BMET,BNP today.  - Will see her in 3 weeks to reassess volume.  2) Chronic atrial fibrillation  - Amio was reduced last visit due to severe tremors.  - Continue amio 200 mg daily.  - Tremor improved. EKG shows atrial fibrillation with rate of 103.  - Rate elevated today, [employer] was here giving her a new transmitter so I had them interrogate her device, her rates are 70-110's. Very few instances of rates over 110. Will keep current dose of metoprolol and consider increasing her metoprolol next visit if she needs it.  3) Essential tremor - Improved. Still present, but she says it is better with reduced dose of Amio 4) HTN - BP elevated today 5) CVA S/P 2015 - On warfarin and ASA.   BMET, BNP today. Follow up in 3 weeks with APP clinic to reassess volume.   , NP-C 2:57 PM

## 2016-07-13 NOTE — Patient Instructions (Addendum)
Increase Furosemide (Lasix) to 40 mg (2 tabs) daily FOR 3 DAYS ONLY, then back to 20 mg (1 tab) daily  Labs today  Your physician recommends that you schedule a follow-up appointment in: 3 weeks

## 2016-07-19 DIAGNOSIS — Z7901 Long term (current) use of anticoagulants: Secondary | ICD-10-CM | POA: Diagnosis not present

## 2016-07-19 DIAGNOSIS — I4891 Unspecified atrial fibrillation: Secondary | ICD-10-CM | POA: Diagnosis not present

## 2016-07-19 DIAGNOSIS — Z5181 Encounter for therapeutic drug level monitoring: Secondary | ICD-10-CM | POA: Diagnosis not present

## 2016-08-04 ENCOUNTER — Encounter (HOSPITAL_COMMUNITY): Payer: Medicare HMO

## 2016-08-08 ENCOUNTER — Ambulatory Visit (HOSPITAL_COMMUNITY)
Admission: RE | Admit: 2016-08-08 | Discharge: 2016-08-08 | Disposition: A | Discharge status: Home / Self Care | Payer: Medicare HMO | Source: Ambulatory Visit | Attending: Internal Medicine | Admitting: Internal Medicine

## 2016-08-08 VITALS — BP 128/106 | HR 108 | Wt 187.6 lb

## 2016-08-08 DIAGNOSIS — Z8673 Personal history of transient ischemic attack (TIA), and cerebral infarction without residual deficits: Secondary | ICD-10-CM | POA: Diagnosis not present

## 2016-08-08 DIAGNOSIS — E669 Obesity, unspecified: Secondary | ICD-10-CM | POA: Insufficient documentation

## 2016-08-08 DIAGNOSIS — N189 Chronic kidney disease, unspecified: Secondary | ICD-10-CM

## 2016-08-08 DIAGNOSIS — G250 Essential tremor: Secondary | ICD-10-CM | POA: Insufficient documentation

## 2016-08-08 DIAGNOSIS — I482 Chronic atrial fibrillation, unspecified: Secondary | ICD-10-CM

## 2016-08-08 DIAGNOSIS — I2510 Atherosclerotic heart disease of native coronary artery without angina pectoris: Secondary | ICD-10-CM | POA: Diagnosis not present

## 2016-08-08 DIAGNOSIS — I495 Sick sinus syndrome: Secondary | ICD-10-CM | POA: Insufficient documentation

## 2016-08-08 DIAGNOSIS — I10 Essential (primary) hypertension: Secondary | ICD-10-CM | POA: Diagnosis not present

## 2016-08-08 DIAGNOSIS — K219 Gastro-esophageal reflux disease without esophagitis: Secondary | ICD-10-CM | POA: Diagnosis not present

## 2016-08-08 DIAGNOSIS — Z7901 Long term (current) use of anticoagulants: Secondary | ICD-10-CM | POA: Diagnosis not present

## 2016-08-08 DIAGNOSIS — Z6834 Body mass index (BMI) 34.0-34.9, adult: Secondary | ICD-10-CM | POA: Diagnosis not present

## 2016-08-08 DIAGNOSIS — Z7982 Long term (current) use of aspirin: Secondary | ICD-10-CM | POA: Insufficient documentation

## 2016-08-08 DIAGNOSIS — Z79899 Other long term (current) drug therapy: Secondary | ICD-10-CM | POA: Diagnosis not present

## 2016-08-08 DIAGNOSIS — I5032 Chronic diastolic (congestive) heart failure: Secondary | ICD-10-CM | POA: Insufficient documentation

## 2016-08-08 DIAGNOSIS — I110 Hypertensive heart disease with heart failure: Secondary | ICD-10-CM | POA: Insufficient documentation

## 2016-08-08 DIAGNOSIS — Z950 Presence of cardiac pacemaker: Secondary | ICD-10-CM | POA: Diagnosis not present

## 2016-08-08 DIAGNOSIS — E785 Hyperlipidemia, unspecified: Secondary | ICD-10-CM | POA: Diagnosis not present

## 2016-08-08 DIAGNOSIS — M1990 Unspecified osteoarthritis, unspecified site: Secondary | ICD-10-CM | POA: Insufficient documentation

## 2016-08-08 MED ORDER — DILTIAZEM HCL 120 MG PO TABS: 120.0000 mg | ORAL_TABLET | Freq: Every day | ORAL | 6 refills | Status: DC

## 2016-08-08 NOTE — Progress Notes (Signed)
Advanced Heart Failure Clinic Note   Patient ID: [name], female   DOB: [birth year], 83 y.o.   MRN: [id number]  HPI: [name] is a delightful 83 year old woman with a history of diastolic HF, chronic atrial fibrillation c/b tachybrady syndrome s/p pacemaker placement in 2010, chronic dyspnea as well as hypertension and gastroesophageal reflux disease.  09/2010   L Main normal LAD mild narrowing in the proximal and midvessel. The first diagonal branch has a 40% ostial stenosis. The left circumflex coronary artery has mild irregularities up to 10- 20%. The right coronary artery is a dominant vessel. It has mild wall irregularities less than or equal to 10%.   Has chronic AF, she saw Dr. Graciela Husbands in past and he noted that nearly 40% of her rates were > 110. Suggested amiodarone vs AV node ablation. We went with amiodarone and she felt much better.   She presents today for regular follow up.  Feels about the same. Feels tired and has nausea most days, which is chronic. Denies any vomiting. Has had a "summer cold" with runny nose and cough. No production with cough. Getting better. SOB walking around the house. Denies overt chest pain. Had mild discomfort in bed a within the past few weeks with coughing. No orthopnea, but sleeps on 2 pillows chronically. Has lightheadedness, but says it is like her "head is spinning around". Taking all medications as directed. Trying to watch fluid. Avoids table salt.   Labs 09/26/12: K 3.5, creatinine 0.84, proBNP 1265          10/09/12: K 3.5, Cr 0.83, AST 21, ALT 11  ECHO 07/2009: EF 55-60%, mild MR, LA mod dilated, RA mild dilated ECHO 10/14: EF 55-60%, biatrial enlargement, normal RV size/systolic function, PA systolic pressure 39 mmHg.  ECHO 7/17:  EF 55-60% RVSP 39 biatrial enlargement.   SH: Lives in [place] with her husband, nonsmoker.   Review of systems complete and found to be negative unless listed in HPI.     Past Medical History:  Diagnosis Date    . Anticoagulation goal of INR 2 to 3 08/21/2015  . Arthritis    oa  . Balance problem   . Chronic diastolic CHF (congestive heart failure) (HCC)   . Dyspnea   . GERD (gastroesophageal reflux disease)   . Hyperlipidemia   . Hypertension   . Hypotensive episode 08/21/2015  . Mild CAD    a. nonobst CAD by cath 09/2010.  . Obesity   . Osteoporosis   . Pacemaker-[employer]   . Permanent atrial fibrillation (HCC)    a. c/b tachy-brady syndrome s/p PPM 2010.  [name] Stroke [name]) 07/2013   Current Outpatient Prescriptions  Medication Sig Dispense Refill  . amiodarone (PACERONE) 200 MG tablet Take 1 tablet (200 mg total) by mouth daily. 30 tablet 3  . calcium carbonate (TUMS) 500 MG chewable tablet Chew 1 tablet by mouth daily as needed for indigestion or heartburn.    . carbidopa-levodopa (SINEMET CR) 50-200 MG tablet Take 1 tablet by mouth 2 (two) times daily.    . furosemide (LASIX) 20 MG tablet Take 20 mg by mouth daily.    . hydroxychloroquine (PLAQUENIL) 200 MG tablet Take 200 mg by mouth 2 (two) times daily.    . metoprolol (LOPRESSOR) 50 MG tablet Take 1 tablet (50 mg total) by mouth 2 (two) times daily.    [name] omeprazole (PRILOSEC) 20 MG capsule Take 20 mg by mouth daily.      [name] rOPINIRole (  REQUIP) 0.5 MG tablet Take 0.5 mg by mouth at bedtime.    [name] warfarin (COUMADIN) 3 MG tablet Take 3 mg by mouth daily.      No current facility-administered medications for this encounter.    Vitals:   08/08/16 1443  BP: (!) 128/106  Pulse: (!) 108  SpO2: 93%  Weight: 187 lb 9.6 oz (85.1 kg)    Wt Readings from Last 3 Encounters:  08/08/16 187 lb 9.6 oz (85.1 kg)  07/13/16 188 lb 6.4 oz (85.5 kg)  05/03/16 189 lb 12 oz (86.1 kg)    PHYSICAL EXAM: General: Elderly appearing. NAD  HEENT: Normal Neck: Supple. JVP 7-8. Carotids 2+ bilat; no bruits. No thyromegaly or nodule noted. Cor: PMI nondisplaced. Irregularly irregular. Tachycardic.  Back: Severe scoliosis and kyphosis Lungs: CTAB,  normal effort. Abdomen: Soft, non-tender, non-distended, no HSM. No bruits or masses. +BS  Extremities: No cyanosis, clubbing, or rash, R and LLE no edema.  Neuro: Alert & orientedx3, cranial nerves grossly intact. moves all 4 extremities w/o difficulty. Affect pleasant. Diffuse tremor.   EKG: Reviewed personally, Afib RVR with rate of 126 bpm  ASSESSMENT & PLAN:  1) Chronic Diastolic HF: Echo July 2017 with EF 55-60% - NYHA III - Volume status stable on exam - Continue lasix 20 mg daily. Can take extra 20 mg as needed.  - Reinforced fluid restriction to < 2 L daily, sodium restriction to less than 2000 mg daily, and the importance of daily weights.   2) Atrial fibrillation with AVR on chronic Afib.   - Rate elevated today. - Add Cardizem 120 mg daily.  - Continue amio 200 mg daily.  - Tremor improved. EKG today shows Afib RVR with rate of 126 bpm.  - Will have her follow up with Dr. Graciela Husbands. Pt wishes to avoid AV node ablation if possible, would prefer to manage medically.  3) Essential tremor - Stable. Had gotten slightly worse on amio. Improved with decrease.  4) HTN - Mildly elevated in setting of poorly controlled rate. Meds as above.  5) CVA S/P 2015 - On warfarin and ASA.  - Denies bleeding.   Meds as above. RTC 4 weeks. Send back to see EP. Has been some time since last visit.   Graciella Freer, PA-C  2:46 PM  Greater than 50% of the 25 minute visit was spent in counseling/coordination of care regarding disease state education, sliding scale diuretics, alarm symptoms, general information concerning AV node ablation and CRT-P upgrade, and discussion of plan with MD.

## 2016-08-08 NOTE — Patient Instructions (Signed)
START Diltiazem (Cardizem) 120 mg tablet once daily.  Follow up with Dr. Graciela Husbands to address irregular heart rate/rhythm. Address: [address] ([address]), [place], [place] [address]  Phone: [phone]  Follow up 4 weeks with Otilio Saber PA-C. Take all medication as prescribed the day of your appointment. Bring all medications with you to your appointment.  Do the following things EVERYDAY: 1) Weigh yourself in the morning before breakfast. Write it down and keep it in a log. 2) Take your medicines as prescribed 3) Eat low salt foods-Limit salt (sodium) to 2000 mg per day.  4) Stay as active as you can everyday 5) Limit all fluids for the day to less than 2 liters

## 2016-08-18 DIAGNOSIS — Z6834 Body mass index (BMI) 34.0-34.9, adult: Secondary | ICD-10-CM | POA: Diagnosis not present

## 2016-08-18 DIAGNOSIS — Z139 Encounter for screening, unspecified: Secondary | ICD-10-CM | POA: Diagnosis not present

## 2016-08-18 DIAGNOSIS — I4891 Unspecified atrial fibrillation: Secondary | ICD-10-CM | POA: Diagnosis not present

## 2016-08-18 DIAGNOSIS — Z7901 Long term (current) use of anticoagulants: Secondary | ICD-10-CM | POA: Diagnosis not present

## 2016-08-18 DIAGNOSIS — Z5181 Encounter for therapeutic drug level monitoring: Secondary | ICD-10-CM | POA: Diagnosis not present

## 2016-08-18 DIAGNOSIS — Z789 Other specified health status: Secondary | ICD-10-CM | POA: Diagnosis not present

## 2016-08-22 DIAGNOSIS — I2510 Atherosclerotic heart disease of native coronary artery without angina pectoris: Secondary | ICD-10-CM | POA: Diagnosis not present

## 2016-08-22 DIAGNOSIS — I110 Hypertensive heart disease with heart failure: Secondary | ICD-10-CM | POA: Diagnosis not present

## 2016-08-22 DIAGNOSIS — I5033 Acute on chronic diastolic (congestive) heart failure: Secondary | ICD-10-CM | POA: Diagnosis not present

## 2016-08-22 DIAGNOSIS — I482 Chronic atrial fibrillation: Secondary | ICD-10-CM | POA: Diagnosis not present

## 2016-09-06 ENCOUNTER — Encounter (HOSPITAL_COMMUNITY): Payer: Self-pay | Admitting: Internal Medicine

## 2016-09-06 ENCOUNTER — Emergency Department (HOSPITAL_COMMUNITY)
Admit: 2016-09-06 | Discharge: 2016-09-06 | Disposition: A | Discharge status: Home / Self Care | Payer: Medicare HMO | Attending: Physician Assistant | Admitting: Physician Assistant

## 2016-09-06 ENCOUNTER — Emergency Department (HOSPITAL_COMMUNITY): Payer: Medicare HMO

## 2016-09-06 ENCOUNTER — Inpatient Hospital Stay (HOSPITAL_COMMUNITY)
Admission: EM | Admit: 2016-09-06 | Discharge: 2016-09-10 | DRG: 871 | Disposition: A | Discharge status: Home Health | Payer: Medicare HMO | Attending: Internal Medicine | Admitting: Internal Medicine

## 2016-09-06 ENCOUNTER — Other Ambulatory Visit: Payer: Self-pay

## 2016-09-06 DIAGNOSIS — I5032 Chronic diastolic (congestive) heart failure: Secondary | ICD-10-CM | POA: Diagnosis not present

## 2016-09-06 DIAGNOSIS — R627 Adult failure to thrive: Secondary | ICD-10-CM | POA: Diagnosis not present

## 2016-09-06 DIAGNOSIS — Z6841 Body Mass Index (BMI) 40.0 and over, adult: Secondary | ICD-10-CM | POA: Diagnosis not present

## 2016-09-06 DIAGNOSIS — I10 Essential (primary) hypertension: Secondary | ICD-10-CM

## 2016-09-06 DIAGNOSIS — R6520 Severe sepsis without septic shock: Secondary | ICD-10-CM | POA: Diagnosis present

## 2016-09-06 DIAGNOSIS — L03116 Cellulitis of left lower limb: Secondary | ICD-10-CM | POA: Diagnosis not present

## 2016-09-06 DIAGNOSIS — E739 Lactose intolerance, unspecified: Secondary | ICD-10-CM | POA: Diagnosis present

## 2016-09-06 DIAGNOSIS — K219 Gastro-esophageal reflux disease without esophagitis: Secondary | ICD-10-CM | POA: Diagnosis not present

## 2016-09-06 DIAGNOSIS — I130 Hypertensive heart and chronic kidney disease with heart failure and stage 1 through stage 4 chronic kidney disease, or unspecified chronic kidney disease: Secondary | ICD-10-CM | POA: Diagnosis not present

## 2016-09-06 DIAGNOSIS — I2510 Atherosclerotic heart disease of native coronary artery without angina pectoris: Secondary | ICD-10-CM | POA: Diagnosis present

## 2016-09-06 DIAGNOSIS — A419 Sepsis, unspecified organism: Secondary | ICD-10-CM | POA: Diagnosis present

## 2016-09-06 DIAGNOSIS — Z90710 Acquired absence of both cervix and uterus: Secondary | ICD-10-CM | POA: Diagnosis not present

## 2016-09-06 DIAGNOSIS — Z8249 Family history of ischemic heart disease and other diseases of the circulatory system: Secondary | ICD-10-CM | POA: Diagnosis not present

## 2016-09-06 DIAGNOSIS — I255 Ischemic cardiomyopathy: Secondary | ICD-10-CM | POA: Diagnosis not present

## 2016-09-06 DIAGNOSIS — I482 Chronic atrial fibrillation, unspecified: Secondary | ICD-10-CM

## 2016-09-06 DIAGNOSIS — Z950 Presence of cardiac pacemaker: Secondary | ICD-10-CM

## 2016-09-06 DIAGNOSIS — M069 Rheumatoid arthritis, unspecified: Secondary | ICD-10-CM | POA: Diagnosis present

## 2016-09-06 DIAGNOSIS — Z8673 Personal history of transient ischemic attack (TIA), and cerebral infarction without residual deficits: Secondary | ICD-10-CM

## 2016-09-06 DIAGNOSIS — M79609 Pain in unspecified limb: Secondary | ICD-10-CM | POA: Diagnosis not present

## 2016-09-06 DIAGNOSIS — Z90722 Acquired absence of ovaries, bilateral: Secondary | ICD-10-CM

## 2016-09-06 DIAGNOSIS — G20A1 Parkinson's disease without dyskinesia, without mention of fluctuations: Secondary | ICD-10-CM | POA: Insufficient documentation

## 2016-09-06 DIAGNOSIS — Z789 Other specified health status: Secondary | ICD-10-CM | POA: Diagnosis not present

## 2016-09-06 DIAGNOSIS — E785 Hyperlipidemia, unspecified: Secondary | ICD-10-CM | POA: Diagnosis present

## 2016-09-06 DIAGNOSIS — N183 Chronic kidney disease, stage 3 (moderate): Secondary | ICD-10-CM | POA: Diagnosis not present

## 2016-09-06 DIAGNOSIS — M79606 Pain in leg, unspecified: Secondary | ICD-10-CM | POA: Diagnosis not present

## 2016-09-06 DIAGNOSIS — M7989 Other specified soft tissue disorders: Secondary | ICD-10-CM

## 2016-09-06 DIAGNOSIS — R197 Diarrhea, unspecified: Secondary | ICD-10-CM | POA: Diagnosis present

## 2016-09-06 DIAGNOSIS — M79602 Pain in left arm: Secondary | ICD-10-CM

## 2016-09-06 DIAGNOSIS — G20 Parkinson's disease: Secondary | ICD-10-CM | POA: Insufficient documentation

## 2016-09-06 DIAGNOSIS — Z9081 Acquired absence of spleen: Secondary | ICD-10-CM | POA: Diagnosis not present

## 2016-09-06 DIAGNOSIS — R251 Tremor, unspecified: Secondary | ICD-10-CM | POA: Diagnosis present

## 2016-09-06 DIAGNOSIS — E876 Hypokalemia: Secondary | ICD-10-CM | POA: Diagnosis not present

## 2016-09-06 DIAGNOSIS — Z7901 Long term (current) use of anticoagulants: Secondary | ICD-10-CM

## 2016-09-06 DIAGNOSIS — Z79899 Other long term (current) drug therapy: Secondary | ICD-10-CM | POA: Diagnosis not present

## 2016-09-06 DIAGNOSIS — R079 Chest pain, unspecified: Secondary | ICD-10-CM

## 2016-09-06 DIAGNOSIS — R000 Tachycardia, unspecified: Secondary | ICD-10-CM | POA: Diagnosis not present

## 2016-09-06 DIAGNOSIS — D899 Disorder involving the immune mechanism, unspecified: Secondary | ICD-10-CM | POA: Diagnosis not present

## 2016-09-06 DIAGNOSIS — E669 Obesity, unspecified: Secondary | ICD-10-CM | POA: Diagnosis present

## 2016-09-06 DIAGNOSIS — N189 Chronic kidney disease, unspecified: Secondary | ICD-10-CM | POA: Diagnosis present

## 2016-09-06 DIAGNOSIS — M810 Age-related osteoporosis without current pathological fracture: Secondary | ICD-10-CM | POA: Diagnosis present

## 2016-09-06 DIAGNOSIS — L02416 Cutaneous abscess of left lower limb: Secondary | ICD-10-CM | POA: Diagnosis not present

## 2016-09-06 DIAGNOSIS — I4891 Unspecified atrial fibrillation: Secondary | ICD-10-CM | POA: Diagnosis not present

## 2016-09-06 DIAGNOSIS — I509 Heart failure, unspecified: Secondary | ICD-10-CM | POA: Diagnosis not present

## 2016-09-06 DIAGNOSIS — I5033 Acute on chronic diastolic (congestive) heart failure: Secondary | ICD-10-CM | POA: Diagnosis present

## 2016-09-06 DIAGNOSIS — E872 Acidosis: Secondary | ICD-10-CM | POA: Diagnosis present

## 2016-09-06 DIAGNOSIS — R9431 Abnormal electrocardiogram [ECG] [EKG]: Secondary | ICD-10-CM | POA: Diagnosis not present

## 2016-09-06 DIAGNOSIS — L0390 Cellulitis, unspecified: Secondary | ICD-10-CM | POA: Diagnosis present

## 2016-09-06 LAB — I-STAT CG4 LACTIC ACID, ED
LACTIC ACID, VENOUS: 3.04 mmol/L — AB (ref 0.5–1.9)
LACTIC ACID, VENOUS: 2.44 mmol/L — AB (ref 0.5–1.9)

## 2016-09-06 LAB — PROTIME-INR
INR: 2.36
PROTHROMBIN TIME: 26.3 s — AB (ref 11.4–15.2)

## 2016-09-06 LAB — I-STAT CHEM 8, ED
BUN: 15 mg/dL (ref 6–20)
CHLORIDE: 104 mmol/L (ref 101–111)
CREATININE: 1 mg/dL (ref 0.44–1.00)
Calcium, Ion: 1.09 mmol/L — ABNORMAL LOW (ref 1.15–1.40)
Glucose, Bld: 101 mg/dL — ABNORMAL HIGH (ref 65–99)
HEMATOCRIT: 44 % (ref 36.0–46.0)
Hemoglobin: 15 g/dL (ref 12.0–15.0)
Potassium: 3.8 mmol/L (ref 3.5–5.1)
SODIUM: 139 mmol/L (ref 135–145)
TCO2: 23 mmol/L (ref 0–100)

## 2016-09-06 LAB — COMPREHENSIVE METABOLIC PANEL
ALT: 8 U/L — AB (ref 14–54)
AST: 26 U/L (ref 15–41)
Albumin: 3 g/dL — ABNORMAL LOW (ref 3.5–5.0)
Alkaline Phosphatase: 83 U/L (ref 38–126)
Anion gap: 8 (ref 5–15)
BUN: 13 mg/dL (ref 6–20)
CHLORIDE: 104 mmol/L (ref 101–111)
CO2: 24 mmol/L (ref 22–32)
CREATININE: 1.18 mg/dL — AB (ref 0.44–1.00)
Calcium: 9.1 mg/dL (ref 8.9–10.3)
GFR calc Af Amer: 45 mL/min — ABNORMAL LOW (ref >60)
GFR calc non Af Amer: 39 mL/min — ABNORMAL LOW (ref >60)
Glucose, Bld: 99 mg/dL (ref 65–99)
POTASSIUM: 3.8 mmol/L (ref 3.5–5.1)
Sodium: 136 mmol/L (ref 135–145)
Total Bilirubin: 1.4 mg/dL — ABNORMAL HIGH (ref 0.3–1.2)
Total Protein: 6.5 g/dL (ref 6.5–8.1)

## 2016-09-06 LAB — CBC WITH DIFFERENTIAL/PLATELET
Basophils Absolute: 0 10*3/uL (ref 0.0–0.1)
Basophils Relative: 0 %
EOS ABS: 0 10*3/uL (ref 0.0–0.7)
EOS PCT: 0 %
HCT: 45.7 % (ref 36.0–46.0)
HEMOGLOBIN: 15.5 g/dL — AB (ref 12.0–15.0)
LYMPHS ABS: 3.8 10*3/uL (ref 0.7–4.0)
LYMPHS PCT: 16 %
MCH: 32.6 pg (ref 26.0–34.0)
MCHC: 33.9 g/dL (ref 30.0–36.0)
MCV: 96.2 fL (ref 78.0–100.0)
MONOS PCT: 12 %
Monocytes Absolute: 2.7 10*3/uL — ABNORMAL HIGH (ref 0.1–1.0)
NEUTROS PCT: 72 %
Neutro Abs: 17.1 10*3/uL — ABNORMAL HIGH (ref 1.7–7.7)
Platelets: 266 10*3/uL (ref 150–400)
RBC: 4.75 MIL/uL (ref 3.87–5.11)
RDW: 13.8 % (ref 11.5–15.5)
WBC: 23.8 10*3/uL — AB (ref 4.0–10.5)

## 2016-09-06 LAB — I-STAT TROPONIN, ED: Troponin i, poc: 0.03 ng/mL (ref 0.00–0.08)

## 2016-09-06 MED ORDER — PIPERACILLIN-TAZOBACTAM 3.375 G IVPB 30 MIN
3.3750 g | Freq: Once | INTRAVENOUS | Status: AC
  Administered 2016-09-06: 3.375 g via INTRAVENOUS
  Filled 2016-09-06: qty 50

## 2016-09-06 MED ORDER — ACETAMINOPHEN 325 MG PO TABS: 650.0000 mg | ORAL_TABLET | Freq: Four times a day (QID) | ORAL | Status: DC | PRN

## 2016-09-06 MED ORDER — CARBIDOPA-LEVODOPA ER 50-200 MG PO TBCR
1.0000 | EXTENDED_RELEASE_TABLET | Freq: Two times a day (BID) | ORAL | Status: DC
  Administered 2016-09-07 – 2016-09-10 (×8): 1 via ORAL
  Filled 2016-09-06 (×10): qty 1

## 2016-09-06 MED ORDER — AMIODARONE HCL 200 MG PO TABS
200.0000 mg | ORAL_TABLET | Freq: Every day | ORAL | Status: DC
  Administered 2016-09-07 – 2016-09-10 (×4): 200 mg via ORAL
  Filled 2016-09-06 (×4): qty 1

## 2016-09-06 MED ORDER — ROPINIROLE HCL 1 MG PO TABS
0.5000 mg | ORAL_TABLET | Freq: Every day | ORAL | Status: DC
  Administered 2016-09-07 – 2016-09-09 (×4): 0.5 mg via ORAL
  Filled 2016-09-06 (×4): qty 1

## 2016-09-06 MED ORDER — PANTOPRAZOLE SODIUM 40 MG PO TBEC
40.0000 mg | DELAYED_RELEASE_TABLET | Freq: Every day | ORAL | Status: DC
  Administered 2016-09-07 – 2016-09-10 (×4): 40 mg via ORAL
  Filled 2016-09-06 (×4): qty 1

## 2016-09-06 MED ORDER — VANCOMYCIN HCL IN DEXTROSE 750-5 MG/150ML-% IV SOLN
750.0000 mg | INTRAVENOUS | Status: DC
  Administered 2016-09-07: 750 mg via INTRAVENOUS
  Filled 2016-09-06: qty 150

## 2016-09-06 MED ORDER — HYDROCODONE-ACETAMINOPHEN 5-325 MG PO TABS
1.0000 | ORAL_TABLET | ORAL | Status: DC | PRN
  Administered 2016-09-06: 1 via ORAL
  Filled 2016-09-06: qty 2
  Filled 2016-09-06: qty 1

## 2016-09-06 MED ORDER — POLYETHYLENE GLYCOL 3350 17 G PO PACK: 17.0000 g | PACK | Freq: Every day | ORAL | Status: DC | PRN

## 2016-09-06 MED ORDER — DILTIAZEM HCL 60 MG PO TABS
120.0000 mg | ORAL_TABLET | Freq: Every day | ORAL | Status: DC
  Filled 2016-09-06: qty 2

## 2016-09-06 MED ORDER — SODIUM CHLORIDE 0.9 % IV SOLN
INTRAVENOUS | Status: AC
  Administered 2016-09-07: via INTRAVENOUS

## 2016-09-06 MED ORDER — ONDANSETRON HCL 4 MG PO TABS: 4.0000 mg | ORAL_TABLET | Freq: Four times a day (QID) | ORAL | Status: DC | PRN

## 2016-09-06 MED ORDER — TIZANIDINE HCL 2 MG PO TABS
2.0000 mg | ORAL_TABLET | Freq: Every day | ORAL | Status: DC
  Administered 2016-09-07 – 2016-09-10 (×4): 2 mg via ORAL
  Filled 2016-09-06 (×4): qty 1

## 2016-09-06 MED ORDER — VANCOMYCIN HCL 10 G IV SOLR
1500.0000 mg | Freq: Once | INTRAVENOUS | Status: AC
  Administered 2016-09-06: 1500 mg via INTRAVENOUS
  Filled 2016-09-06: qty 1500

## 2016-09-06 MED ORDER — SODIUM CHLORIDE 0.9 % IV SOLN
INTRAVENOUS | Status: DC
  Administered 2016-09-06: 18:00:00 via INTRAVENOUS

## 2016-09-06 MED ORDER — ACETAMINOPHEN 650 MG RE SUPP: 650.0000 mg | Freq: Four times a day (QID) | RECTAL | Status: DC | PRN

## 2016-09-06 MED ORDER — SENNA 8.6 MG PO TABS
1.0000 | ORAL_TABLET | Freq: Two times a day (BID) | ORAL | Status: DC
  Administered 2016-09-07 – 2016-09-08 (×5): 8.6 mg via ORAL
  Filled 2016-09-06 (×6): qty 1

## 2016-09-06 MED ORDER — ONDANSETRON HCL 4 MG/2ML IJ SOLN: 4.0000 mg | Freq: Four times a day (QID) | INTRAMUSCULAR | Status: DC | PRN

## 2016-09-06 MED ORDER — SODIUM CHLORIDE 0.9 % IV BOLUS (SEPSIS)
1000.0000 mL | Freq: Once | INTRAVENOUS | Status: AC
  Administered 2016-09-06: 1000 mL via INTRAVENOUS

## 2016-09-06 MED ORDER — PIPERACILLIN-TAZOBACTAM 3.375 G IVPB
3.3750 g | Freq: Three times a day (TID) | INTRAVENOUS | Status: DC
  Administered 2016-09-07 – 2016-09-09 (×8): 3.375 g via INTRAVENOUS
  Filled 2016-09-06 (×11): qty 50

## 2016-09-06 MED ORDER — VANCOMYCIN HCL IN DEXTROSE 1-5 GM/200ML-% IV SOLN
1000.0000 mg | Freq: Once | INTRAVENOUS | Status: DC
  Filled 2016-09-06: qty 200

## 2016-09-06 NOTE — ED Notes (Signed)
Portable xray at bedside.

## 2016-09-06 NOTE — ED Notes (Signed)
Patient transported to X-ray 

## 2016-09-06 NOTE — H&P (Signed)
[name]:[id number] DOB: 1923/06/29 DOA: 09/06/2016     PCP: Charlott Rakes, MD   Outpatient Specialists: [facility]  Patient coming from:    home Lives  With family grand daughter    Chief Complaint: Leg pain and swelling  HPI: [name] is a 83 y.o. female with medical history significant of a.fib on chronic anticoagulation with Coumadin, chronic diastolic CHF, tachybrady syndrome s/p pacemaker placement in 2010, HTN and GERD, CTD, history of CVA, HLD    Presented with left leg redness swelling overall feeling bad for the past 2 days associated some nausea. States at home her heart rates have been going up to 135 ago she started Cardizem last week. She may have had mild chest pain this morning but now went away. although she occasionally still short of breath. She does have chronic CHF. Denies any fevers or chills. But yesterday she may have had some subjective fevers because she felt hot. She has been nauseous unable to tolerate her PO meds.   Regarding pertinent Chronic problems: Cardiac catheterization 2012 minimal coronary artery disease Last echogram in July 2017 preserved EF bilateral atrial enlargement and able to assess for diastolic dysfunction Fibrillations on amiodarone  IN ER:  Temp (24hrs), Avg:98.8 F (37.1 C), Min:98.8 F (37.1 C), Max:98.8 F (37.1 C)      on arrival  ED Triage Vitals  Enc Vitals Group     BP 09/06/16 1730 113/72     Pulse Rate 09/06/16 1730 (!) 110     Resp 09/06/16 1730 19     Temp 09/06/16 1730 98.8 F (37.1 C)     Temp Source 09/06/16 1730 Oral     SpO2 09/06/16 1716 96 %     Weight 09/06/16 1730 187 lb (84.8 kg)     Height 09/06/16 1730 5' (1.524 m)     Head Circumference --      Peak Flow --      Pain Score 09/06/16 1720 8     Pain Loc --      Pain Edu? --      Excl. in GC? --   Meeting  sepsis criteria -  increase respirations 32 tachycardic up to 119 systolic blood pressure currently 134/82 lactic  acid 3.04  Sodium 1:30 6K3.8 BUN 13 creatinine 1.18 3.0 CBC 23.8 hemoglobin 15.5 INR 2.36 Chest x-ray stable cardiomegaly and mild interstitial edema trace bilateral pleural effusions Following Medications were ordered in ER: Medications  0.9 %  sodium chloride infusion ( Intravenous New Bag/Given 09/06/16 1823)  vancomycin (VANCOCIN) IVPB 750 mg/150 ml premix (not administered)  piperacillin-tazobactam (ZOSYN) IVPB 3.375 g (not administered)  sodium chloride 0.9 % bolus 1,000 mL (not administered)  piperacillin-tazobactam (ZOSYN) IVPB 3.375 g (3.375 g Intravenous New Bag/Given 09/06/16 1821)  vancomycin (VANCOCIN) 1,500 mg in sodium chloride 0.9 % 500 mL IVPB (1,500 mg Intravenous New Bag/Given 09/06/16 1202)     Hospitalist was called for admission for Sepsis secondary to cellulitis with A.fib w RVR  Review of Systems:    Pertinent positives include:  fatigue, leg pain and swelling  Constitutional:  No weight loss, night sweats, Fevers, chills,weight loss  HEENT:  No headaches, Difficulty swallowing,Tooth/dental problems,Sore throat,  No sneezing, itching, ear ache, nasal congestion, post nasal drip,  Cardio-vascular:  No chest pain, Orthopnea, PND, anasarca, dizziness, palpitations.no Bilateral lower extremity swelling  GI:  No heartburn, indigestion, abdominal pain, nausea, vomiting, diarrhea, change in bowel habits, loss of appetite, melena, blood  in stool, hematemesis Resp:  no shortness of breath at rest. No dyspnea on exertion, No excess mucus, no productive cough, No non-productive cough, No coughing up of blood.No change in color of mucus.No wheezing. Skin:  no rash or lesions. No jaundice GU:  no dysuria, change in color of urine, no urgency or frequency. No straining to urinate.  No flank pain.  Musculoskeletal:  No joint pain or no joint swelling. No decreased range of motion. No back pain.  Psych:  No change in mood or affect. No depression or anxiety. No memory  loss.  Neuro: no localizing neurological complaints, no tingling, no weakness, no double vision, no gait abnormality, no slurred speech, no confusion  As per HPI otherwise 10 point review of systems negative.   Past Medical History: Past Medical History:  Diagnosis Date  . Anticoagulation goal of INR 2 to 3 08/21/2015  . Arthritis    oa  . Balance problem   . Chronic diastolic CHF (congestive heart failure) (HCC)   . Dyspnea   . GERD (gastroesophageal reflux disease)   . Hyperlipidemia   . Hypertension   . Hypotensive episode 08/21/2015  . Mild CAD    a. nonobst CAD by cath 09/2010.  . Obesity   . Osteoporosis   . Pacemaker-St. Jude   . Permanent atrial fibrillation (HCC)    a. c/b tachy-brady syndrome s/p PPM 2010.  [name] Stroke [facility]) 07/2013   Past Surgical History:  Procedure Laterality Date  . ABDOMINAL HYSTERECTOMY    . BREAST LUMPECTOMY Right   . CARDIAC CATHETERIZATION    . COLECTOMY  1991  . INSERT / REPLACE / REMOVE PACEMAKER     St Jude  . SPLENECTOMY    . TOTAL ABDOMINAL HYSTERECTOMY W/ BILATERAL SALPINGOOPHORECTOMY  1974     Social History:  Ambulatory   [name]     reports that she has never smoked. She has never used smokeless tobacco. Her alcohol and drug histories are not on file.  Allergies:   Allergies  Allergen Reactions  . Lipitor [Atorvastatin] Other (See Comments)    Myalgias (noted in neurologist note on 08/23/2013 when admitted for acute CVA).  08/20/15:  Pravastatin started at that time. However, patient is not taking Pravastatin/told okay to stop it by PCP. [facility] said it was last filled in 07/2013)   . Morphine And Related Other (See Comments)    hallucinations       Family History:   Family History  Problem Relation Age of Onset  . Cancer Other   . Heart disease Other   . Hypertension Other     Medications: Prior to Admission medications   Medication Sig Start Date End Date Taking? Authorizing Provider  amiodarone  (PACERONE) 200 MG tablet Take 1 tablet (200 mg total) by mouth daily. 05/03/16   Bensimhon, Bevelyn Buckles, MD  calcium carbonate (TUMS) 500 MG chewable tablet Chew 1 tablet by mouth daily as needed for indigestion or heartburn.    [provider]  carbidopa-levodopa (SINEMET CR) 50-200 MG tablet Take 1 tablet by mouth 2 (two) times daily. 08/10/15   [provider]  diltiazem (CARDIZEM) 120 MG tablet Take 1 tablet (120 mg total) by mouth daily. 08/08/16   Graciella Freer, PA-C  furosemide (LASIX) 20 MG tablet Take 20 mg by mouth daily.    [provider]  hydroxychloroquine (PLAQUENIL) 200 MG tablet Take 200 mg by mouth 2 (two) times daily.    [provider]  metoprolol (LOPRESSOR) 50 MG tablet Take 1 tablet (50 mg total) by mouth 2 (two) times daily. 09/01/15 12/06/16  Duke Salvia, MD  omeprazole (PRILOSEC) 20 MG capsule Take 20 mg by mouth daily.      [provider]  rOPINIRole (REQUIP) 0.5 MG tablet Take 0.5 mg by mouth at bedtime.    [provider]  warfarin (COUMADIN) 3 MG tablet Take 3 mg by mouth daily.     [provider]    Physical Exam: Patient Vitals for the past 24 hrs:  BP Temp Temp src Pulse Resp SpO2 Height Weight  09/06/16 1900 134/82 - - (!) 103 (!) 32 96 % - -  09/06/16 1815 (!) 137/94 - - - (!) 23 - - -  09/06/16 1730 113/72 98.8 F (37.1 C) Oral (!) 110 19 98 % 5' (1.524 m) 84.8 kg (187 lb)  09/06/16 1716 - - - - - 96 % - -    1. General:  in No Acute distress 2. Psychological: Alert and  Oriented 3. Head/ENT:    Dry Mucous Membranes                          Head Non traumatic, neck supple                            Poor Dentition 4. SKIN:    decreased Skin turgor,  Skin clean Dry and intact no rash Redness and swelling of the left lower extremity      5. Heart: tachyCardiac irRegular rate and rhythm no  Murmur, Rub or gallop 6. Lungs:   no wheezes or crackles   7. Abdomen: Soft,  non-tender, Non  distended 8. Lower extremities: no clubbing, cyanosis,   edema on the left 9. Neurologically Grossly intact, moving all 4 extremities equally   10. MSK: Normal range of motion   body mass index is 36.52 kg/m.  Labs on Admission:   Labs on Admission: I have personally reviewed following labs and imaging studies  CBC:  Recent Labs Lab 09/06/16 1810  WBC 23.8*  NEUTROABS 17.1*  HGB 15.5*  HCT 45.7  MCV 96.2  PLT 266   Basic Metabolic Panel:  Recent Labs Lab 09/06/16 1810  NA 136  K 3.8  CL 104  CO2 24  GLUCOSE 99  BUN 13  CREATININE 1.18*  CALCIUM 9.1   GFR: Estimated Creatinine Clearance: 30 mL/min (A) (by C-G formula based on SCr of 1.18 mg/dL (H)). Liver Function Tests:  Recent Labs Lab 09/06/16 1810  AST 26  ALT 8*  ALKPHOS 83  BILITOT 1.4*  PROT 6.5  ALBUMIN 3.0*   No results for input(s): LIPASE, AMYLASE in the last 168 hours. No results for input(s): AMMONIA in the last 168 hours. Coagulation Profile:  Recent Labs Lab 09/06/16 1810  INR 2.36   Cardiac Enzymes: No results for input(s): CKTOTAL, CKMB, CKMBINDEX, TROPONINI in the last 168 hours. BNP (last 3 results) No results for input(s): PROBNP in the last 8760 hours. HbA1C: No results for input(s): HGBA1C in the last 72 hours. CBG: No results for input(s): GLUCAP in the last 168 hours. Lipid Profile: No results for input(s): CHOL, HDL, LDLCALC, TRIG, CHOLHDL, LDLDIRECT in the last 72 hours. Thyroid Function Tests: No results for input(s): TSH, T4TOTAL, FREET4, T3FREE, THYROIDAB in the last 72 hours. Anemia Panel: No results for input(s): VITAMINB12, FOLATE, FERRITIN, TIBC,  IRON, RETICCTPCT in the last 72 hours. Urine analysis:  Sepsis Labs: @LABRCNTIP (procalcitonin:4,lacticidven:4) )No results found for this or any previous visit (from the past 240 hour(s)).    UA not ordered  Lab Results  Component Value Date   HGBA1C 6.2 (H) 08/22/2013    Estimated Creatinine Clearance: 30  mL/min (A) (by C-G formula based on SCr of 1.18 mg/dL (H)).  BNP (last 3 results) No results for input(s): PROBNP in the last 8760 hours.   ECG REPORT  Independently reviewed Rate: 125  Rhythm: A. fib with RVR ST&T Change: No acute ischemic changes   QTC 506  Filed Weights   09/06/16 1730  Weight: 84.8 kg (187 lb)     Cultures:    Component Value Date/Time   SDES URINE, RANDOM 03/21/2014 1646   SPECREQUEST NONE 03/21/2014 1646   CULT  03/21/2014 1646    Multiple bacterial morphotypes present, none predominant. Suggest appropriate recollection if clinically indicated. Performed at 03/23/2014    REPTSTATUS 03/23/2014 FINAL 03/21/2014 1646     Radiological Exams on Admission: Dg Tibia/fibula Left  Result Date: 09/06/2016 CLINICAL DATA:  Acute onset of left lower leg pain, erythema and swelling. Initial encounter. EXAM: LEFT TIBIA AND FIBULA - 2 VIEW COMPARISON:  Left knee radiographs performed 07/06/2014 FINDINGS: There is no evidence of fracture or dislocation. The left tibia and fibula appear intact. Mild diffuse joint space narrowing is noted at the knee, with prominent subcortical cysts at the medial tibial plateau. No significant knee joint effusion is identified. Medial soft tissue swelling is noted about the lower leg. The ankle mortise appears grossly intact. IMPRESSION: 1. No evidence of fracture or dislocation. 2. Tricompartmental osteoarthritis at the left knee, most prominent at the medial compartment. Electronically Signed   By: 09/05/2014 M.D.   On: 09/06/2016 21:33   Dg Chest Port 1 View  Result Date: 09/06/2016 CLINICAL DATA:  Cellulitis, left-sided sharp chest pain yesterday. Chronic dyspnea. EXAM: PORTABLE CHEST 1 VIEW COMPARISON:  None. FINDINGS: A large hiatal hernia projects over the cardiac silhouette. There is aortic atherosclerosis at the arch with uncoiling of the thoracic aorta. The heart is enlarged but stable. There is mild interstitial  edema. Probable tiny pleural effusions bilaterally. Patient is rotated on current exam. Left-sided pacemaker apparatus projects over the periphery of the left hemithorax with right atrial and right ventricular leads identified. No acute nor suspicious osseous abnormality. Degenerative changes are seen along dorsal spine. IMPRESSION: 1. Stable cardiomegaly and aortic atherosclerosis. 2. Mild interstitial edema with trace bilateral pleural effusions. 3. Large hiatal hernia. Electronically Signed   By: 11/06/2016 M.D.   On: 09/06/2016 18:35    Chart has been reviewed    Assessment/Plan   83 y.o. female with medical history significant of a.fib on chronic anticoagulation with Coumadin, chronic diastolic CHF, tachybrady syndrome s/p pacemaker placement in 2010, HTN and GERD, CTD, history of CVA, HLD Admitted for Sepsis secondary to cellulitis with A.fib w RVR   Present on Admission: . Sepsis (HCC)  - continue broad-spectrum IV antibiotics including Zosyn and vancomycin, rehydrate follow serial lactic acid . Essential hypertension - continue Cardizem for rate control of atrial fibrillation if able to tolerate by mouth . Chronic kidney disease -  . Chronic diastolic heart failure (HCC) -hold Lasix for tonight and rehydrate gently avoid fluid overload . Chronic atrial fibrillation (HCC) -  .Chronic Atrial fibrillation (HCC)        - CHA2DS2 vas score 6 : continue  current anticoagulation with  Coumadin per pharmacy,            -  Rate control:  Currently controlled with  Diltiazem will continue if unable to tolerate by mouth we'll need to switch to IV drip        - Rhythm control:  Continue amiodarone  . Cellulitis - -admit per cellulitis protocol will       continue current antibiotic choice Zosyn/vanc      plain films showed:  no evidence of air  no evidence of osteomyelitis   no               foreign   objects      Will obtain MRSA screening,       obtain blood cultures      further  antibiotic adjustment pending above results Hx of arthritis -  hold Plaquenil  History of tremor -  continue home medications including Sinemet . Atrial fibrillation with RVR (HCC) - she has not taken any of his home medications will restart and see if heart rate can be controlled if not we'll need to switch her diltiazem IV . Chest pain - mild currently chest pain free Cycle CE obtain echogram   Other plan as per orders.  DVT prophylaxis:  coumadin  Code Status:  FULL CODE  as per patient   Family Communication:   Family   at  Bedside  plan of care was discussed with   Daughter  Disposition Plan:    To home once workup is complete and patient is stable                         Would benefit from PT/OT eval prior to DC ordered                                                Consults called: none    Admission status:    inpatient      Level of care     SDU      I have spent a total of 56 min on this admission    Keria Widrig 09/06/2016, 10:38 PM    [facility]  Pager [phone]   after 2 AM please page floor coverage PA If 7AM-7PM, please contact the day team taking care of the patient  [email or web address]  Password [email or web address]

## 2016-09-06 NOTE — ED Provider Notes (Signed)
[facility]-EMERGENCY DEPT Provider Note   CSN: [id number] Arrival date & time: 09/06/16  1714     History   Chief Complaint Chief Complaint  Patient presents with  . Weakness  . Cellulitis     HPI   Blood pressure 113/72, pulse (!) 110, temperature 98.8 F (37.1 C), temperature source Oral, resp. rate 19, height 5' (1.524 m), weight 84.8 kg (187 lb), SpO2 98 %.  [name] is a 83 y.o. female with past medical history significant for CHF (normal EF in July 2017), , A. fib (anticoagulated with Coumadin) complaining about left lower extremity pain which she noticed yesterday with tactile fever, nausea and feeling generally fatigued. There was no trauma to the affected leg that she can recall. She states that she had an episode of intermittent chest pain this morning now is fully resolved. Her INR was checked last week and she believes it to be 1.3.  Past Medical History:  Diagnosis Date  . Anticoagulation goal of INR 2 to 3 08/21/2015  . Arthritis    oa  . Balance problem   . Chronic diastolic CHF (congestive heart failure) (HCC)   . Dyspnea   . GERD (gastroesophageal reflux disease)   . Hyperlipidemia   . Hypertension   . Hypotensive episode 08/21/2015  . Mild CAD    a. nonobst CAD by cath 09/2010.  . Obesity   . Osteoporosis   . Pacemaker-[employer]   . Permanent atrial fibrillation (HCC)    a. c/b tachy-brady syndrome s/p PPM 2010.  [name] Stroke [facility]) 07/2013    Patient Active Problem List   Diagnosis Date Noted  . Sepsis (HCC) 09/06/2016  . Cellulitis 09/06/2016  . Parkinson disease (HCC) 09/06/2016  . Anticoagulation goal of INR 2 to 3 08/21/2015  . Hypotensive episode 08/21/2015  . Pain in the chest   . Accelerated hypertension 08/18/2015  . Elevated troponin- for one draw- may be spurious 08/18/2015  . Vertigo 04/11/2014  . Chest pain 03/21/2014  . Viral syndrome 03/21/2014  . Nausea 03/21/2014  . Diarrhea 03/21/2014  . Pulmonary nodule 03/21/2014  . CVA  (cerebral infarction) 08/21/2013  . TIA (transient ischemic attack) 08/21/2013  . Shortness of breath 04/10/2013  . Cardiac pacemaker-St[employer] Lauralee [name] 03/05/2013  . Chronic diastolic heart failure (HCC) 10/18/2011  . Chronic kidney disease 09/06/2010  . CVA 07/07/2009  . SICK SINUS SYNDROME 02/03/2009  . SHOULDER PAIN, LEFT, CHRONIC 02/03/2009  . INF&INFLAM REACT DUE CARD DEVICE IMPLANT&GRAFT 10/20/2008  . HYPERLIPIDEMIA-MIXED 06/04/2008  . Essential hypertension 06/04/2008  . Chronic atrial fibrillation (HCC) 06/04/2008    Past Surgical History:  Procedure Laterality Date  . ABDOMINAL HYSTERECTOMY    . BREAST LUMPECTOMY Right   . CARDIAC CATHETERIZATION    . COLECTOMY  1991  . INSERT / REPLACE / REMOVE PACEMAKER     [employer] Jude  . SPLENECTOMY    . TOTAL ABDOMINAL HYSTERECTOMY W/ BILATERAL SALPINGOOPHORECTOMY  1974    OB History    No data available       Home Medications    Prior to Admission medications   Medication Sig Start Date End Date Taking? Authorizing Provider  amiodarone (PACERONE) 200 MG tablet Take 1 tablet (200 mg total) by mouth daily. 05/03/16   Bensimhon, Bevelyn Buckles, MD  calcium carbonate (TUMS) 500 MG chewable tablet Chew 1 tablet by mouth daily as needed for indigestion or heartburn.    [provider]  carbidopa-levodopa (SINEMET CR) 50-200 MG tablet Take 1 tablet  by mouth 2 (two) times daily. 08/10/15   [provider]  diltiazem (CARDIZEM) 120 MG tablet Take 1 tablet (120 mg total) by mouth daily. 08/08/16   Graciella Freer, PA-C  furosemide (LASIX) 20 MG tablet Take 20 mg by mouth daily.    [provider]  hydroxychloroquine (PLAQUENIL) 200 MG tablet Take 200 mg by mouth 2 (two) times daily.    [provider]  metoprolol (LOPRESSOR) 50 MG tablet Take 1 tablet (50 mg total) by mouth 2 (two) times daily. 09/01/15 12/06/16  Duke Salvia, MD  omeprazole (PRILOSEC) 20 MG capsule Take 20 mg by mouth daily.      [provider]  rOPINIRole (REQUIP) 0.5 MG tablet Take 0.5 mg by mouth at bedtime.    [provider]  warfarin (COUMADIN) 3 MG tablet Take 3 mg by mouth daily.     [provider]    Family History Family History  Problem Relation Age of Onset  . Cancer Other   . Heart disease Other   . Hypertension Other     Social History Social History  Substance Use Topics  . Smoking status: Never Smoker  . Smokeless tobacco: Never Used  . Alcohol use Not on file     Allergies   Lipitor [atorvastatin] and Morphine and related   Review of Systems Review of Systems  A complete review of systems was obtained and all systems are negative except as noted in the HPI and PMH.   Physical Exam Updated Vital Signs BP 134/82   Pulse (!) 103   Temp 98.8 F (37.1 C) (Oral)   Resp (!) 32   Ht 5' (1.524 m)   Wt 84.8 kg (187 lb)   SpO2 96%   BMI 36.52 kg/m   Physical Exam  Constitutional: She is oriented to person, place, and time. She appears well-developed and well-nourished. No distress.  HENT:  Head: Normocephalic and atraumatic.  Mouth/Throat: Oropharynx is clear and moist.  Eyes: Pupils are equal, round, and reactive to light. Conjunctivae and EOM are normal.  Neck: Normal range of motion.  Cardiovascular: Intact distal pulses.   Tachycardic and irregular  Pulmonary/Chest: Effort normal and breath sounds normal.  Abdominal: Soft. There is no tenderness.  Musculoskeletal: Normal range of motion. She exhibits edema and tenderness.  Warmth with erythema and tenderness along the left lower extremity from the ankle to the knee no focal fluctuance, no overt signs of trauma.  Neurological: She is alert and oriented to person, place, and time.  Skin: She is not diaphoretic.  Psychiatric: She has a normal mood and affect.  Nursing note and vitals reviewed.    ED Treatments / Results  Labs (all labs ordered are listed, but only abnormal results are displayed) Labs  Reviewed  COMPREHENSIVE METABOLIC PANEL - Abnormal; Notable for the following:       Result Value   Creatinine, Ser 1.18 (*)    Albumin 3.0 (*)    ALT 8 (*)    Total Bilirubin 1.4 (*)    GFR calc non Af Amer 39 (*)    GFR calc Af Amer 45 (*)    All other components within normal limits  CBC WITH DIFFERENTIAL/PLATELET - Abnormal; Notable for the following:    WBC 23.8 (*)    Hemoglobin 15.5 (*)    Neutro Abs 17.1 (*)    Monocytes Absolute 2.7 (*)    All other components within normal limits  PROTIME-INR - Abnormal;  Notable for the following:    Prothrombin Time 26.3 (*)    All other components within normal limits  I-STAT CG4 LACTIC ACID, ED - Abnormal; Notable for the following:    Lactic Acid, Venous 3.04 (*)    All other components within normal limits  CULTURE, BLOOD (ROUTINE X 2)  CULTURE, BLOOD (ROUTINE X 2)  URINALYSIS, ROUTINE W REFLEX MICROSCOPIC  I-STAT CHEM 8, ED  I-STAT CHEM 8, ED  I-STAT TROPONIN, ED    EKG  EKG Interpretation None       Radiology Dg Chest Port 1 View  Result Date: 09/06/2016 CLINICAL DATA:  Cellulitis, left-sided sharp chest pain yesterday. Chronic dyspnea. EXAM: PORTABLE CHEST 1 VIEW COMPARISON:  None. FINDINGS: A large hiatal hernia projects over the cardiac silhouette. There is aortic atherosclerosis at the arch with uncoiling of the thoracic aorta. The heart is enlarged but stable. There is mild interstitial edema. Probable tiny pleural effusions bilaterally. Patient is rotated on current exam. Left-sided pacemaker apparatus projects over the periphery of the left hemithorax with right atrial and right ventricular leads identified. No acute nor suspicious osseous abnormality. Degenerative changes are seen along dorsal spine. IMPRESSION: 1. Stable cardiomegaly and aortic atherosclerosis. 2. Mild interstitial edema with trace bilateral pleural effusions. 3. Large hiatal hernia. Electronically Signed   By: Tollie Eth M.D.   On: 09/06/2016 18:35      Procedures Procedures (including critical care time)  Medications Ordered in ED Medications  0.9 %  sodium chloride infusion ( Intravenous New Bag/Given 09/06/16 1823)  vancomycin (VANCOCIN) IVPB 750 mg/150 ml premix (not administered)  piperacillin-tazobactam (ZOSYN) IVPB 3.375 g (not administered)  sodium chloride 0.9 % bolus 1,000 mL (not administered)  piperacillin-tazobactam (ZOSYN) IVPB 3.375 g (3.375 g Intravenous New Bag/Given 09/06/16 1821)  vancomycin (VANCOCIN) 1,500 mg in sodium chloride 0.9 % 500 mL IVPB (1,500 mg Intravenous New Bag/Given 09/06/16 1202)     Initial Impression / Assessment and Plan / ED Course  I have reviewed the triage vital signs and the nursing notes.  Pertinent labs & imaging results that were available during my care of the patient were reviewed by me and considered in my medical decision making (see chart for details).     Vitals:   09/06/16 1716 09/06/16 1730 09/06/16 1815 09/06/16 1900  BP:  113/72 (!) 137/94 134/82  Pulse:  (!) 110  (!) 103  Resp:  19 (!) 23 (!) 32  Temp:  98.8 F (37.1 C)    TempSrc:  Oral    SpO2: 96% 98%  96%  Weight:  84.8 kg (187 lb)    Height:  5' (1.524 m)      Medications  0.9 %  sodium chloride infusion ( Intravenous New Bag/Given 09/06/16 1823)  vancomycin (VANCOCIN) IVPB 750 mg/150 ml premix (not administered)  piperacillin-tazobactam (ZOSYN) IVPB 3.375 g (not administered)  sodium chloride 0.9 % bolus 1,000 mL (not administered)  piperacillin-tazobactam (ZOSYN) IVPB 3.375 g (3.375 g Intravenous New Bag/Given 09/06/16 1821)  vancomycin (VANCOCIN) 1,500 mg in sodium chloride 0.9 % 500 mL IVPB (1,500 mg Intravenous New Bag/Given 09/06/16 1202)    [name] is 83 y.o. female presenting with  Left lower extremity cellulitis, patient afebrile but tachycardic. Patient had chest pain this a.m. however that is resolved. Physical exam is consistent with a diffuse lower extremity cellulitis, no subcutaneous emphysema  or pain out of proportion to exam to suggest a necrotizing fasciitis. Blood work with elevated lactic acid of 3.04,  white count of 23.8, she does have CHF patient started on infusion of 250 mL by mouth cc per hour, when lactic acid has resulted I will give her 1 L bolus. She started on Vanco and Zosyn. DVT study negative. [facility] hospitalist admission.   Final Clinical Impressions(s) / ED Diagnoses   Final diagnoses:  Sepsis affecting skin [facility])    New Prescriptions New Prescriptions   No medications on file     Kaylyn Lim 09/06/16 2010    Abelino Derrick, MD 09/06/16 [phone]

## 2016-09-06 NOTE — Progress Notes (Signed)
VASCULAR LAB PRELIMINARY  PRELIMINARY  PRELIMINARY  PRELIMINARY  Left lower extremity venous duplex completed.    Preliminary report:  Left:  No evidence of DVT, superficial thrombosis, or Baker's cyst.  Haskell Rihn, RVS 09/06/2016, 7:11 PM

## 2016-09-06 NOTE — ED Notes (Signed)
RN drew one set of blood cultures and attempted to straight stick for 2nd set, which was unsuccessful. Phleb and ED tech consulted to draw 2nd set. Abx started.

## 2016-09-06 NOTE — ED Triage Notes (Addendum)
Pt BIB EMS for weakness and nausea x 2 days, and redness of left leg and foot since last night. Pt has hx of afib and was started on cardizem last week; rate between 110 and 135 on ED arrival. Pt denies CP or recent falls; states she has intermittent SOB. Res e/u; A&Ox4; Afebrile on assessment; no neuro deficits. NAD at this time.

## 2016-09-06 NOTE — ED Notes (Signed)
ED Provider at bedside. 

## 2016-09-06 NOTE — Progress Notes (Signed)
Pharmacy Antibiotic Note  [name] is a 83 y.o. female admitted on 09/06/2016 with cellulitis.  Pharmacy has been consulted for vancomycin and Zosyn dosing. Upon admission, Patient was afebrile, WBC 23.8, Scr 1.18, Lactic acid 3.04.   Plan: Vancomycin 750 IV every 24 hours.  Goal trough 10-15 mcg/mL. Zosyn 3.375g IV q8h (4 hour infusion).  Monitor renal function and cultures. Follow-up vanc trough as needed.   Height: 5' (152.4 cm) Weight: 187 lb (84.8 kg) IBW/kg (Calculated) : 45.5  Temp (24hrs), Avg:98.8 F (37.1 C), Min:98.8 F (37.1 C), Max:98.8 F (37.1 C)   Recent Labs Lab 09/06/16 1826  LATICACIDVEN 3.04*    Estimated Creatinine Clearance: 30 mL/min (A) (by C-G formula based on SCr of 1.18 mg/dL (H)).    Allergies  Allergen Reactions  . Lipitor [Atorvastatin] Other (See Comments)    Myalgias (noted in neurologist note on 08/23/2013 when admitted for acute CVA).  08/20/15:  Pravastatin started at that time. However, patient is not taking Pravastatin/told okay to stop it by PCP. [facility] said it was last filled in 07/2013)   . Morphine And Related Other (See Comments)    hallucinations    Antimicrobials this admission: 8/7 vancomycin >>  8/7 zosyn >>   Dose adjustments this admission: none  Microbiology results: none yet  Thank you for allowing pharmacy to be a part of this patient's care.  Ladell Pier, PharmD Pharmacy Resident Pager: [phone] 09/06/2016 7:33 PM

## 2016-09-07 DIAGNOSIS — A419 Sepsis, unspecified organism: Principal | ICD-10-CM

## 2016-09-07 DIAGNOSIS — L03116 Cellulitis of left lower limb: Secondary | ICD-10-CM

## 2016-09-07 DIAGNOSIS — I5032 Chronic diastolic (congestive) heart failure: Secondary | ICD-10-CM

## 2016-09-07 DIAGNOSIS — N183 Chronic kidney disease, stage 3 (moderate): Secondary | ICD-10-CM

## 2016-09-07 DIAGNOSIS — I4891 Unspecified atrial fibrillation: Secondary | ICD-10-CM

## 2016-09-07 LAB — CBC WITH DIFFERENTIAL/PLATELET
Basophils Absolute: 0 10*3/uL (ref 0.0–0.1)
Basophils Relative: 0 %
EOS ABS: 0 10*3/uL (ref 0.0–0.7)
EOS PCT: 0 %
HCT: 52.5 % — ABNORMAL HIGH (ref 36.0–46.0)
HEMOGLOBIN: 17.4 g/dL — AB (ref 12.0–15.0)
LYMPHS ABS: 2.5 10*3/uL (ref 0.7–4.0)
Lymphocytes Relative: 16 %
MCH: 32.2 pg (ref 26.0–34.0)
MCHC: 33.1 g/dL (ref 30.0–36.0)
MCV: 97 fL (ref 78.0–100.0)
MONOS PCT: 9 %
Monocytes Absolute: 1.3 10*3/uL — ABNORMAL HIGH (ref 0.1–1.0)
NEUTROS PCT: 75 %
Neutro Abs: 11.7 10*3/uL — ABNORMAL HIGH (ref 1.7–7.7)
Platelets: 133 10*3/uL — ABNORMAL LOW (ref 150–400)
RBC: 5.41 MIL/uL — ABNORMAL HIGH (ref 3.87–5.11)
RDW: 14.4 % (ref 11.5–15.5)
WBC: 15.5 10*3/uL — ABNORMAL HIGH (ref 4.0–10.5)

## 2016-09-07 LAB — CBC
HCT: 41.2 % (ref 36.0–46.0)
HEMOGLOBIN: 13.4 g/dL (ref 12.0–15.0)
MCH: 31.3 pg (ref 26.0–34.0)
MCHC: 32.5 g/dL (ref 30.0–36.0)
MCV: 96.3 fL (ref 78.0–100.0)
PLATELETS: 232 10*3/uL (ref 150–400)
RBC: 4.28 MIL/uL (ref 3.87–5.11)
RDW: 14 % (ref 11.5–15.5)
WBC: 17.8 10*3/uL — AB (ref 4.0–10.5)

## 2016-09-07 LAB — COMPREHENSIVE METABOLIC PANEL
ALK PHOS: 64 U/L (ref 38–126)
ALK PHOS: 69 U/L (ref 38–126)
ALT: 10 U/L — ABNORMAL LOW (ref 14–54)
ALT: 10 U/L — AB (ref 14–54)
ANION GAP: 6 (ref 5–15)
AST: 20 U/L (ref 15–41)
AST: 20 U/L (ref 15–41)
Albumin: 2.5 g/dL — ABNORMAL LOW (ref 3.5–5.0)
Albumin: 2.5 g/dL — ABNORMAL LOW (ref 3.5–5.0)
Anion gap: 8 (ref 5–15)
BILIRUBIN TOTAL: 1.2 mg/dL (ref 0.3–1.2)
BILIRUBIN TOTAL: 1.4 mg/dL — AB (ref 0.3–1.2)
BUN: 13 mg/dL (ref 6–20)
BUN: 12 mg/dL (ref 6–20)
CALCIUM: 8.1 mg/dL — AB (ref 8.9–10.3)
CO2: 21 mmol/L — ABNORMAL LOW (ref 22–32)
CO2: 24 mmol/L (ref 22–32)
CREATININE: 0.96 mg/dL (ref 0.44–1.00)
Calcium: 8.1 mg/dL — ABNORMAL LOW (ref 8.9–10.3)
Chloride: 108 mmol/L (ref 101–111)
Chloride: 109 mmol/L (ref 101–111)
Creatinine, Ser: 1.04 mg/dL — ABNORMAL HIGH (ref 0.44–1.00)
GFR calc Af Amer: 58 mL/min — ABNORMAL LOW (ref >60)
GFR calc non Af Amer: 46 mL/min — ABNORMAL LOW (ref >60)
GFR, EST AFRICAN AMERICAN: 53 mL/min — AB (ref >60)
GFR, EST NON AFRICAN AMERICAN: 50 mL/min — AB (ref >60)
Glucose, Bld: 103 mg/dL — ABNORMAL HIGH (ref 65–99)
Glucose, Bld: 125 mg/dL — ABNORMAL HIGH (ref 65–99)
Potassium: 3.9 mmol/L (ref 3.5–5.1)
Potassium: 3.4 mmol/L — ABNORMAL LOW (ref 3.5–5.1)
SODIUM: 138 mmol/L (ref 135–145)
Sodium: 138 mmol/L (ref 135–145)
TOTAL PROTEIN: 5.5 g/dL — AB (ref 6.5–8.1)
Total Protein: 5.6 g/dL — ABNORMAL LOW (ref 6.5–8.1)

## 2016-09-07 LAB — TSH: TSH: 3.623 u[IU]/mL (ref 0.350–4.500)

## 2016-09-07 LAB — LACTIC ACID, PLASMA
LACTIC ACID, VENOUS: 2.8 mmol/L — AB (ref 0.5–1.9)
LACTIC ACID, VENOUS: 2.1 mmol/L — AB (ref 0.5–1.9)
LACTIC ACID, VENOUS: 2.2 mmol/L — AB (ref 0.5–1.9)
Lactic Acid, Venous: 1.6 mmol/L (ref 0.5–1.9)
Lactic Acid, Venous: 3.2 mmol/L (ref 0.5–1.9)
Lactic Acid, Venous: 2.7 mmol/L (ref 0.5–1.9)

## 2016-09-07 LAB — PROTIME-INR
INR: 2.17
PROTHROMBIN TIME: 24.5 s — AB (ref 11.4–15.2)

## 2016-09-07 LAB — MRSA PCR SCREENING: MRSA by PCR: NEGATIVE

## 2016-09-07 LAB — PROCALCITONIN: PROCALCITONIN: 2.29 ng/mL

## 2016-09-07 LAB — MAGNESIUM: Magnesium: 1.9 mg/dL (ref 1.7–2.4)

## 2016-09-07 LAB — PHOSPHORUS: Phosphorus: 2.9 mg/dL (ref 2.5–4.6)

## 2016-09-07 MED ORDER — SODIUM CHLORIDE 0.9 % IV BOLUS (SEPSIS)
500.0000 mL | Freq: Once | INTRAVENOUS | Status: AC
  Administered 2016-09-07: 500 mL via INTRAVENOUS

## 2016-09-07 MED ORDER — WARFARIN - PHARMACIST DOSING INPATIENT: Freq: Every day | Status: DC

## 2016-09-07 MED ORDER — WARFARIN SODIUM 3 MG PO TABS
3.0000 mg | ORAL_TABLET | Freq: Once | ORAL | Status: AC
  Administered 2016-09-07: 3 mg via ORAL
  Filled 2016-09-07 (×2): qty 1

## 2016-09-07 MED ORDER — HYDROCORTISONE NA SUCCINATE PF 100 MG IJ SOLR
50.0000 mg | Freq: Three times a day (TID) | INTRAMUSCULAR | Status: DC
  Administered 2016-09-07 – 2016-09-08 (×3): 50 mg via INTRAVENOUS
  Filled 2016-09-07 (×3): qty 2

## 2016-09-07 MED ORDER — DILTIAZEM HCL 60 MG PO TABS
120.0000 mg | ORAL_TABLET | Freq: Every day | ORAL | Status: DC
  Administered 2016-09-07 – 2016-09-09 (×4): 120 mg via ORAL
  Filled 2016-09-07 (×4): qty 2

## 2016-09-07 NOTE — Evaluation (Signed)
Physical Therapy Evaluation Patient Details Name: [name] MRN: [id number] DOB: Aug 25, 1923 Today's Date: 09/07/2016   History of Present Illness  Pt adm with sepsis and LLE cellulitis. PMH - afib, pacer, chf, htn, CVA, cad  Clinical Impression  Pt admitted with above diagnosis and presents to PT with functional limitations due to deficits listed below (See PT problem list). Pt needs skilled PT to maximize independence and safety to allow discharge to home with family. Pt's painful LLE is primary limitation. Expect mobility will improve as pain improves.     Follow Up Recommendations Home health PT;Supervision for mobility/OOB    Equipment Recommendations  None recommended by PT    Recommendations for Other Services       Precautions / Restrictions Restrictions Weight Bearing Restrictions: No      Mobility  Bed Mobility Overal bed mobility: Modified Independent             General bed mobility comments: Incr time  Transfers Overall transfer level: Needs assistance Equipment used: Rolling walker (2 wheeled) Transfers: Sit to/from Stand;Stand Pivot Transfers Sit to Stand: +2 physical assistance;Min assist Stand pivot transfers: +2 physical assistance;Min assist       General transfer comment: Assist to bring hips up. Pt with difficulty bearing wt on LLE due to pain. Used walker to take small pivotal steps from chair to chairl  Ambulation/Gait Ambulation/Gait assistance: +2 physical assistance;Min assist Ambulation Distance (Feet): 2 Feet Assistive device: Rolling walker (2 wheeled) Gait Pattern/deviations: Step-to pattern;Decreased step length - right;Decreased stance time - left;Antalgic;Trunk flexed Gait velocity: decr Gait velocity interpretation: Below normal speed for age/gender General Gait Details: Assist for balance and support.   Stairs            Wheelchair Mobility    Modified Rankin (Stroke Patients Only)       Balance Overall  balance assessment: Needs assistance Sitting-balance support: No upper extremity supported;Feet supported Sitting balance-Leahy Scale: Normal     Standing balance support: Bilateral upper extremity supported Standing balance-Leahy Scale: Poor Standing balance comment: walker and min assist due to pain in LLE                             Pertinent Vitals/Pain Pain Assessment: 0-10 Pain Score: 7  Pain Location: RLE Pain Descriptors / Indicators: Grimacing;Throbbing Pain Intervention(s): Limited activity within patient's tolerance;Monitored during session;Repositioned    Home Living Family/patient expects to be discharged to:: Private residence Living Arrangements: Other relatives (great granddaughter) Available Help at Discharge: Family;Available 24 hours/day Type of Home: Mobile home Home Access: Stairs to enter Entrance Stairs-Rails: Doctor, general practice of Steps: 1 Home Layout: One level Home Equipment: Walker - 2 wheels      Prior Function Level of Independence: Independent               Hand Dominance   Dominant Hand: Right    Extremity/Trunk Assessment   Upper Extremity Assessment Upper Extremity Assessment: Overall WFL for tasks assessed    Lower Extremity Assessment Lower Extremity Assessment: LLE deficits/detail LLE Deficits / Details: Limited by pain       Communication   Communication: HOH  Cognition Arousal/Alertness: Awake/alert Behavior During Therapy: WFL for tasks assessed/performed Overall Cognitive Status: Within Functional Limits for tasks assessed  General Comments      Exercises     Assessment/Plan    PT Assessment Patient needs continued PT services  PT Problem List Decreased activity tolerance;Decreased balance;Decreased mobility;Pain       PT Treatment Interventions DME instruction;Gait training;Functional mobility training;Therapeutic  activities;Therapeutic exercise;Balance training;Patient/family education    PT Goals (Current goals can be found in the Care Plan section)  Acute Rehab PT Goals Patient Stated Goal: return home PT Goal Formulation: With patient Time For Goal Achievement: 09/14/16 Potential to Achieve Goals: Good    Frequency Min 3X/week   Barriers to discharge        Co-evaluation               AM-PAC PT "6 Clicks" Daily Activity  Outcome Measure Difficulty turning over in bed (including adjusting bedclothes, sheets and blankets)?: None Difficulty moving from lying on back to sitting on the side of the bed? : None Difficulty sitting down on and standing up from a chair with arms (e.g., wheelchair, bedside commode, etc,.)?: Total Help needed moving to and from a bed to chair (including a wheelchair)?: A Lot Help needed walking in hospital room?: A Lot Help needed climbing 3-5 steps with a railing? : Total 6 Click Score: 14    End of Session Equipment Utilized During Treatment: Gait belt Activity Tolerance: Patient limited by pain Patient left: in chair;with call bell/phone within reach;with chair alarm set Nurse Communication: Mobility status PT Visit Diagnosis: Unsteadiness on feet (R26.81);Other abnormalities of gait and mobility (R26.89);Pain Pain - Right/Left: Left Pain - part of body: Leg    Time: 0962-8366 PT Time Calculation (min) (ACUTE ONLY): 28 min   Charges:   PT Evaluation $PT Eval Moderate Complexity: 1 Mod PT Treatments $Gait Training: 8-22 mins   PT G Codes:        [facility] PT [phone]   Angelina Ok [facility] 09/07/2016, 1:00 PM

## 2016-09-07 NOTE — Progress Notes (Signed)
Advanced Heart Failure Team Consult Note   Primary Physician: Dr Yetta Flock Primary Cardiologist: Dr Gala Romney   Reason for Consultation: Heart Failure  HPI:    [name] is seen today for evaluation of heart failure at the request of Dr Roda Shutters.   [name] is a 83 year old with a history of diastolic heart failure, chronic a fib complicated by tachybrady syndrome, S/P pacemaker 2010, HTN, GERD, CVA 2015 admitted with   Followed in the HF clinic and was last seen August 08, 2016. At that time volume status was stable on 20 mg po lasix. . Started on 120 mg cardizem for rate control.  Weight at that time was 187 pounds.   Presented to the ED with leg edema, fatigue, and nausea. Admitted with cellulitis--> sepsis from LLE. Started on broad spectrum antibiotics and placed on sepsis protocol. CXR with mild edema and large hiatal hernia. Xray LLE - negative. Pertinent admission labs included: K 3.8, WBC 23.8, lactic acid 3.04, and hgb 15.5.   Complaining of fatigue and mild dyspnea with exertion.     ECHO 08/2015 EF 55-60%.   Review of Systems: [y] = yes, [ ]  = no   General: Weight gain [ ] ; Weight loss [ ] ; Anorexia [ ] ; Fatigue [Y ]; Fever [ ] ; Chills [ ] ; Weakness [Y ]  Cardiac: Chest pain/pressure [ ] ; Resting SOB [ ] ; Exertional SOB [ ] ; Orthopnea [ ] ; Pedal Edema [ ] ; Palpitations [ ] ; Syncope [ ] ; Presyncope [ ] ; Paroxysmal nocturnal dyspnea[ ]   Pulmonary: Cough [ ] ; Wheezing[ ] ; Hemoptysis[ ] ; Sputum [ ] ; Snoring [ ]   GI: Vomiting[ ] ; Dysphagia[ ] ; Melena[ ] ; Hematochezia [ ] ; Heartburn[ ] ; Abdominal pain [ ] ; Constipation [ ] ; Diarrhea [ ] ; BRBPR [ ]   GU: Hematuria[ ] ; Dysuria [ ] ; Nocturia[ ]   Vascular: Pain in legs with walking [ Y]; Pain in feet with lying flat [ ] ; Non-healing sores [ ] ; Stroke [ ] ; TIA [ ] ; Slurred speech [ ] ;  Neuro: Headaches[ ] ; Vertigo[ ] ; Seizures[ ] ; Paresthesias[ ] ;Blurred vision [ ] ; Diplopia [ ] ; Vision changes [ ]   Ortho/Skin: Arthritis [ ] ; Joint pain  [Y ]; Muscle pain [ ] ; Joint swelling [ ] ; Back Pain [Y ]; Rash [ ]   Psych: Depression[ ] ; Anxiety[ ]   Heme: Bleeding problems [ ] ; Clotting disorders [ ] ; Anemia [ ]   Endocrine: Diabetes [ ] ; Thyroid dysfunction[ ]   Home Medications Prior to Admission medications   Medication Sig Start Date End Date Taking? Authorizing Provider  amiodarone (PACERONE) 200 MG tablet Take 1 tablet (200 mg total) by mouth daily. 05/03/16  Yes Cederick Broadnax, Bevelyn Buckles, MD  calcium carbonate (TUMS) 500 MG chewable tablet Chew 1 tablet by mouth daily as needed for indigestion or heartburn.   Yes [provider]  carbidopa-levodopa (SINEMET CR) 50-200 MG tablet Take 1 tablet by mouth 2 (two) times daily. 08/10/15  Yes [provider]  diltiazem (CARDIZEM) 120 MG tablet Take 1 tablet (120 mg total) by mouth daily. 08/08/16  Yes Graciella Freer, PA-C  furosemide (LASIX) 20 MG tablet Take 20 mg by mouth daily.   Yes [provider]  hydroxychloroquine (PLAQUENIL) 200 MG tablet Take 200 mg by mouth 2 (two) times daily.   Yes [provider]  metoprolol tartrate (LOPRESSOR) 25 MG tablet Take 25 mg by mouth daily. 08/02/16  Yes [provider]  omeprazole (PRILOSEC) 20 MG capsule Take 20 mg by mouth daily.  Yes [provider]  rOPINIRole (REQUIP) 0.5 MG tablet Take 0.5 mg by mouth at bedtime.   Yes [provider]  tiZANidine (ZANAFLEX) 2 MG tablet Take 2 mg by mouth daily. 08/02/16  Yes [provider]  warfarin (COUMADIN) 3 MG tablet Take 3-5 mg by mouth See admin instructions. Takes 5 mg on Monday and Thursday. Takes  3 mg on the other days   Yes [provider]  metoprolol (LOPRESSOR) 50 MG tablet Take 1 tablet (50 mg total) by mouth 2 (two) times daily. Patient not taking: Reported on 09/06/2016 09/01/15 12/06/16  Duke Salvia, MD    Past Medical History: Past Medical History:  Diagnosis Date  . Anticoagulation goal of INR 2 to 3 08/21/2015   . Arthritis    oa  . Balance problem   . Chronic diastolic CHF (congestive heart failure) (HCC)   . Dyspnea   . GERD (gastroesophageal reflux disease)   . Hyperlipidemia   . Hypertension   . Hypotensive episode 08/21/2015  . Mild CAD    a. nonobst CAD by cath 09/2010.  . Obesity   . Osteoporosis   . Pacemaker-St. Jude   . Permanent atrial fibrillation (HCC)    a. c/b tachy-brady syndrome s/p PPM 2010.  [name] Stroke [facility]) 07/2013    Past Surgical History: Past Surgical History:  Procedure Laterality Date  . ABDOMINAL HYSTERECTOMY    . BREAST LUMPECTOMY Right   . CARDIAC CATHETERIZATION    . COLECTOMY  1991  . INSERT / REPLACE / REMOVE PACEMAKER     St Jude  . SPLENECTOMY    . TOTAL ABDOMINAL HYSTERECTOMY W/ BILATERAL SALPINGOOPHORECTOMY  1974    Family History: Family History  Problem Relation Age of Onset  . Cancer Other   . Heart disease Other   . Hypertension Other     Social History: Social History   Social History  . Marital status: Widowed    Spouse name: N/A  . Number of children: N/A  . Years of education: N/A   Social History Main Topics  . Smoking status: Never Smoker  . Smokeless tobacco: Never Used  . Alcohol use None  . Drug use: Unknown  . Sexual activity: Not Asked   Other Topics Concern  . None   Social History Narrative  . None    Allergies:  Allergies  Allergen Reactions  . Lipitor [Atorvastatin] Other (See Comments)    Myalgias (noted in neurologist note on 08/23/2013 when admitted for acute CVA).  08/20/15:  Pravastatin started at that time. However, patient is not taking Pravastatin/told okay to stop it by PCP. [facility] said it was last filled in 07/2013)   . Morphine And Related Other (See Comments)    hallucinations    Objective:    Vital Signs:   Temp:  [97.7 F (36.5 C)-98.8 F (37.1 C)] 97.7 F (36.5 C) (08/08 0600) Pulse Rate:  [39-166] 39 (08/08 0732) Resp:  [14-32] 22 (08/08 0732) BP: (92-137)/(53-94) 107/59  (08/08 0732) SpO2:  [92 %-98 %] 98 % (08/08 0732) Weight:  [187 lb (84.8 kg)] 187 lb (84.8 kg) (08/07 1730) Last BM Date: 09/06/16  Weight change: Filed Weights   09/06/16 1730  Weight: 187 lb (84.8 kg)    Intake/Output:   Intake/Output Summary (Last 24 hours) at 09/07/16 1022 Last data filed at 09/07/16 0600  Gross per 24 hour  Intake           3202.5 ml  Output  0 ml  Net           3202.5 ml      Physical Exam    General:  Eldelry frail. No resp difficulty. Tremor. Sitting in the chair HEENT: normal Neck: supple. JVP 6-7 . Carotids 2+ bilat; no bruits. No lymphadenopathy or thyromegaly appreciated. Cor: PMI nondisplaced. Irregular rate & rhythm. No rubs, gallops or murmurs. Lungs: clear on room air Abdomen: soft, nontender, nondistended. No hepatosplenomegaly. No bruits or masses. Good bowel sounds. Extremities: no cyanosis, clubbing, rash, severe LLE erythema from foot to knee. No open sores. Neuro: alert & orientedx3, cranial nerves grossly intact. moves all 4 extremities w/o difficulty. Affect pleasant   Telemetry   A fib 80-100s. Personally reviewed.  EKG   Admit EKG A fib 130 bpm   Labs   Basic Metabolic Panel:  Recent Labs Lab 09/06/16 1810 09/06/16 2052 09/07/16 0000 09/07/16 0226  NA 136 139 138 138  K 3.8 3.8 3.9 3.4*  CL 104 104 108 109  CO2 24  --  24 21*  GLUCOSE 99 101* 103* 125*  BUN 13 15 13 12   CREATININE 1.18* 1.00 1.04* 0.96  CALCIUM 9.1  --  8.1* 8.1*  MG  --   --   --  1.9  PHOS  --   --   --  2.9    Liver Function Tests:  Recent Labs Lab 09/06/16 1810 09/07/16 0000 09/07/16 0226  AST 26 20 20   ALT 8* 10* 10*  ALKPHOS 83 64 69  BILITOT 1.4* 1.4* 1.2  PROT 6.5 5.5* 5.6*  ALBUMIN 3.0* 2.5* 2.5*   No results for input(s): LIPASE, AMYLASE in the last 168 hours. No results for input(s): AMMONIA in the last 168 hours.  CBC:  Recent Labs Lab 09/06/16 1810 09/06/16 2052 09/07/16 0000 09/07/16 0226  WBC  23.8*  --  15.5* 17.8*  NEUTROABS 17.1*  --  11.7*  --   HGB 15.5* 15.0 17.4* 13.4  HCT 45.7 44.0 52.5* 41.2  MCV 96.2  --  97.0 96.3  PLT 266  --  133* 232    Cardiac Enzymes: No results for input(s): CKTOTAL, CKMB, CKMBINDEX, TROPONINI in the last 168 hours.  BNP: BNP (last 3 results)  Recent Labs  07/13/16 1515  BNP 367.4*    ProBNP (last 3 results) No results for input(s): PROBNP in the last 8760 hours.   CBG: No results for input(s): GLUCAP in the last 168 hours.  Coagulation Studies:  Recent Labs  09/06/16 1810 09/07/16 0226  LABPROT 26.3* 24.5*  INR 2.36 2.17     Imaging   Dg Tibia/fibula Left  Result Date: 09/06/2016 CLINICAL DATA:  Acute onset of left lower leg pain, erythema and swelling. Initial encounter. EXAM: LEFT TIBIA AND FIBULA - 2 VIEW COMPARISON:  Left knee radiographs performed 07/06/2014 FINDINGS: There is no evidence of fracture or dislocation. The left tibia and fibula appear intact. Mild diffuse joint space narrowing is noted at the knee, with prominent subcortical cysts at the medial tibial plateau. No significant knee joint effusion is identified. Medial soft tissue swelling is noted about the lower leg. The ankle mortise appears grossly intact. IMPRESSION: 1. No evidence of fracture or dislocation. 2. Tricompartmental osteoarthritis at the left knee, most prominent at the medial compartment. Electronically Signed   By: Roanna Raider M.D.   On: 09/06/2016 21:33   Dg Chest Port 1 View  Result Date: 09/06/2016 CLINICAL DATA:  Cellulitis, left-sided sharp chest pain  yesterday. Chronic dyspnea. EXAM: PORTABLE CHEST 1 VIEW COMPARISON:  None. FINDINGS: A large hiatal hernia projects over the cardiac silhouette. There is aortic atherosclerosis at the arch with uncoiling of the thoracic aorta. The heart is enlarged but stable. There is mild interstitial edema. Probable tiny pleural effusions bilaterally. Patient is rotated on current exam. Left-sided  pacemaker apparatus projects over the periphery of the left hemithorax with right atrial and right ventricular leads identified. No acute nor suspicious osseous abnormality. Degenerative changes are seen along dorsal spine. IMPRESSION: 1. Stable cardiomegaly and aortic atherosclerosis. 2. Mild interstitial edema with trace bilateral pleural effusions. 3. Large hiatal hernia. Electronically Signed   By: Tollie Eth M.D.   On: 09/06/2016 18:35      Medications:     Current Medications: . amiodarone  200 mg Oral Daily  . carbidopa-levodopa  1 tablet Oral BID WC  . diltiazem  120 mg Oral q1800  . pantoprazole  40 mg Oral Daily  . rOPINIRole  0.5 mg Oral QHS  . senna  1 tablet Oral BID  . tiZANidine  2 mg Oral Daily  . warfarin  3 mg Oral ONCE-1800  . Warfarin - Pharmacist Dosing Inpatient   Does not apply q1800     Infusions: . piperacillin-tazobactam (ZOSYN)  IV 3.375 g (09/07/16 0540)  . vancomycin         Patient Profile   [name] is a 83 year old with chronic diastolic heart failure and chronic a fib admitted with sepsis/cellulits likely from LLE.    Assessment/Plan   1. Sepsis- Hypotensive. Off bb for now. Lactic Acid 3.04>2.4>1.6  WBC trending down 23>17 Blood CX pending. On vanc and zosyn.  2. LLE Cellulitis- Xray LLE ok . On antibiotics. 3. Chronic Diastolic Heart Failure- ECHO 2017 EF 55-60%.  Volume status ok. Restart lasix 20 mg daily tomorrow. BMET stable.  4. Chronic A fib- Initially tachy on admit but resolved. Continue amio and diltiazem for rate control. On coumadin. INR per Pharmacy. Discussed with Pharm D   --has PPM for tachy/brady 5. H/O CVA 2015- on coumadin. No statin with allergy 6. Tremor  Length of Stay: 1  Amy Clegg, NP  09/07/2016, 10:22 AM  Advanced Heart Failure Team Pager [phone] (M-F; 7a - 4p)  Please contact [facility] for night-coverage after hours (4p -7a ) and weekends on [email or web address]  Patient seen and examined with Tonye Becket,  NP. We discussed all aspects of the encounter. I agree with the assessment and plan as stated above.   She has severe sepsis in the setting of marked LLE cellulitis. Currently being support with IVF resuscitation. BP remains soft. AF rate initially fast but now improved with volume. No overt HF on exam.   Would continue aggressive management of her sepsis and LLE cellulitis. Will eventually need some diuresis but would not start yet. We will follow with you and help guide volume management. AF rate control now improved.   She has PPM so will ned to follow cultures. If comes back + for staph may need to consider device extraction (vs life-long suppressive abx).   Arvilla Meres, MD  2:20 PM

## 2016-09-07 NOTE — Progress Notes (Signed)
ANTICOAGULATION CONSULT NOTE - Initial Consult  Pharmacy Consult for Coumadin Indication: atrial fibrillation  Allergies  Allergen Reactions  . Lipitor [Atorvastatin] Other (See Comments)    Myalgias (noted in neurologist note on 08/23/2013 when admitted for acute CVA).  08/20/15:  Pravastatin started at that time. However, patient is not taking Pravastatin/told okay to stop it by PCP. [facility] said it was last filled in 07/2013)   . Morphine And Related Other (See Comments)    hallucinations    Patient Measurements: Height: 5' (152.4 cm) Weight: 187 lb (84.8 kg) IBW/kg (Calculated) : 45.5  Vital Signs: Temp: 98.6 F (37 C) (08/07 2314) Temp Source: Oral (08/07 2314) BP: 116/86 (08/07 2330) Pulse Rate: 44 (08/07 2330)  Labs:  Recent Labs  09/06/16 1810 09/06/16 2052  HGB 15.5* 15.0  HCT 45.7 44.0  PLT 266  --   LABPROT 26.3*  --   INR 2.36  --   CREATININE 1.18* 1.00    Estimated Creatinine Clearance: 35.4 mL/min (by C-G formula based on SCr of 1 mg/dL).   Medical History: Past Medical History:  Diagnosis Date  . Anticoagulation goal of INR 2 to 3 08/21/2015  . Arthritis    oa  . Balance problem   . Chronic diastolic CHF (congestive heart failure) (HCC)   . Dyspnea   . GERD (gastroesophageal reflux disease)   . Hyperlipidemia   . Hypertension   . Hypotensive episode 08/21/2015  . Mild CAD    a. nonobst CAD by cath 09/2010.  . Obesity   . Osteoporosis   . Pacemaker-St. Jude   . Permanent atrial fibrillation (HCC)    a. c/b tachy-brady syndrome s/p PPM 2010.  [name] Stroke [facility]) 07/2013    Medications:  No current facility-administered medications on file prior to encounter.    Current Outpatient Prescriptions on File Prior to Encounter  Medication Sig Dispense Refill  . amiodarone (PACERONE) 200 MG tablet Take 1 tablet (200 mg total) by mouth daily. 30 tablet 3  . calcium carbonate (TUMS) 500 MG chewable tablet Chew 1 tablet by mouth daily as needed  for indigestion or heartburn.    . carbidopa-levodopa (SINEMET CR) 50-200 MG tablet Take 1 tablet by mouth 2 (two) times daily.    [name] diltiazem (CARDIZEM) 120 MG tablet Take 1 tablet (120 mg total) by mouth daily. 30 tablet 6  . furosemide (LASIX) 20 MG tablet Take 20 mg by mouth daily.    . hydroxychloroquine (PLAQUENIL) 200 MG tablet Take 200 mg by mouth 2 (two) times daily.    [name] omeprazole (PRILOSEC) 20 MG capsule Take 20 mg by mouth daily.      [name] rOPINIRole (REQUIP) 0.5 MG tablet Take 0.5 mg by mouth at bedtime.    [name] warfarin (COUMADIN) 3 MG tablet Take 3-5 mg by mouth See admin instructions. Takes 5 mg on Monday and Thursday. Takes  3 mg on the other days    . metoprolol (LOPRESSOR) 50 MG tablet Take 1 tablet (50 mg total) by mouth 2 (two) times daily. (Patient not taking: Reported on 09/06/2016)       Assessment: 83 y.o. female admitted with cellulitis/sepsis, with h/o Afib, to continue Coumadin.  Last dose taken 8/7  Goal of Therapy:  INR 2-3 Monitor platelets by anticoagulation protocol: Yes   Plan:  Daily INR  Eddie Candle 09/07/2016,12:02 AM

## 2016-09-07 NOTE — Progress Notes (Signed)
CRITICAL VALUE ALERT  Critical Value:  Lactic Acid 2.8  Date & Time Notied:  09/07/16 @ 2023  Provider Notified: Merdis Delay, NP  Orders Received/Actions taken: Orders for a bolus

## 2016-09-07 NOTE — ED Notes (Signed)
Pt transferred from ED stretched to hospital bed.

## 2016-09-07 NOTE — Progress Notes (Signed)
   Per patient, Advanced Directive not needed.

## 2016-09-07 NOTE — Progress Notes (Signed)
PROGRESS NOTE  [name] QHK:[id number] DOB: Feb 20, 1923 DOA: 09/06/2016 PCP: Charlott Rakes, MD  HPI/Recap of past 24 hours:  Overall feeling better, " feeling sick into her stomach" has resolved,  She continues to c/o left leg pain ( has been having problem for the last few weeks, not able to bear weight due to pain)  C/o worsening of chronic tremor. PT in room  bp soft, lactic acid improved but then got worse again, try small dose of bolus and start on stress dose steroids, cardizem with holding parameter  Assessment/Plan: Active Problems:   Essential hypertension   Chronic atrial fibrillation (HCC)   Chronic kidney disease   Chronic diastolic heart failure (HCC)   Chest pain   Sepsis (HCC)   Cellulitis   Atrial fibrillation with RVR (HCC)   Tremor  Sepsis presented on admission  with left lower leg cellulitis ( circumferential erythema /edema involves more than 80% of her left lower leg, extending up to left knee and extending down to left foot),  leukocytosis ( wbc 23.8), lactic acidosis (lactic acid 3.04), tachycardia, heart rate  110's,  -Blood culture no growth on day one, mrsa screening negative -She is started on vanc/sozyn since admission, will continue for now, deescalating abx if patient bp improves.  left lower leg cellulitis ( circumferential erythema /edema involves more than 80% of her left lower leg, extending up to left knee and extending down to left foot),  Left lower leg x ray no fracture, no mention of osteo Left lower extremity doppler no DVT Continue abx  AFib/RVR; s/p pace maker -I have personally reviewed ekg and tele, she remains in afib, heart rate has improved. -coumadin per pharmacy (INR 2.17) -continue cardizem (with holding parameters), continue amiodarone, home meds lopressor held since admission due to borderline bp -cardiology consulted.  Diastolic chf:  Currently dry to euvolemic, home meds lasix held in the setting of  sepsis -cardiology consulted  CKDIII, cr/gfr close to baseline, renal dosing meds  H/o HTN, now bp borderline low with sepsis continue cardizem (with holding parameters),   home meds lopressor held since admission due to borderline bp  H/o RA on plaquenil/chronically immunosupressed status, held in the setting of sepsis  Code Status: full  Family Communication: patient   Disposition Plan: remain in stepdown, likely able to transfer to med tele if she continue to improve   Consultants:  cardiology  Procedures:  none  Antibiotics:  Vanc/zosyn from admission   Objective: BP (!) 107/59 (BP Location: Right Arm)   Pulse (!) 39   Temp 97.7 F (36.5 C) (Oral)   Resp (!) 22   Ht 5' (1.524 m)   Wt 84.8 kg (187 lb)   SpO2 98%   BMI 36.52 kg/m   Intake/Output Summary (Last 24 hours) at 09/07/16 0742 Last data filed at 09/07/16 0600  Gross per 24 hour  Intake           3202.5 ml  Output                0 ml  Net           3202.5 ml   Filed Weights   09/06/16 1730  Weight: 84.8 kg (187 lb)    Exam: Patient is examined daily including today on 09/07/2016, exam remain the same as of yesterday except that is highlighted below   General:  Frail elderly, NAD  Cardiovascular: IRRR  Respiratory: CTABL  Abdomen: Soft/ND/NT, positive BS  Musculoskeletal:left lower  extremity Edema ( circumferential erythema /edema involves more than 80% of her left lower leg, extending up to left knee and extending down to left foot),   Neuro: chronic tremor, residual left lower extremity weakness from prior cva  Data Reviewed: Basic Metabolic Panel:  Recent Labs Lab 09/06/16 1810 09/06/16 2052 09/07/16 0000 09/07/16 0226  NA 136 139 138 138  K 3.8 3.8 3.9 3.4*  CL 104 104 108 109  CO2 24  --  24 21*  GLUCOSE 99 101* 103* 125*  BUN 13 15 13 12   CREATININE 1.18* 1.00 1.04* 0.96  CALCIUM 9.1  --  8.1* 8.1*  MG  --   --   --  1.9  PHOS  --   --   --  2.9   Liver Function  Tests:  Recent Labs Lab 09/06/16 1810 09/07/16 0000 09/07/16 0226  AST 26 20 20   ALT 8* 10* 10*  ALKPHOS 83 64 69  BILITOT 1.4* 1.4* 1.2  PROT 6.5 5.5* 5.6*  ALBUMIN 3.0* 2.5* 2.5*   No results for input(s): LIPASE, AMYLASE in the last 168 hours. No results for input(s): AMMONIA in the last 168 hours. CBC:  Recent Labs Lab 09/06/16 1810 09/06/16 2052 09/07/16 0000 09/07/16 0226  WBC 23.8*  --  15.5* 17.8*  NEUTROABS 17.1*  --  11.7*  --   HGB 15.5* 15.0 17.4* 13.4  HCT 45.7 44.0 52.5* 41.2  MCV 96.2  --  97.0 96.3  PLT 266  --  133* 232   Cardiac Enzymes:   No results for input(s): CKTOTAL, CKMB, CKMBINDEX, TROPONINI in the last 168 hours. BNP (last 3 results)  Recent Labs  07/13/16 1515  BNP 367.4*    ProBNP (last 3 results) No results for input(s): PROBNP in the last 8760 hours.  CBG: No results for input(s): GLUCAP in the last 168 hours.  No results found for this or any previous visit (from the past 240 hour(s)).   Studies: Dg Tibia/fibula Left  Result Date: 09/06/2016 CLINICAL DATA:  Acute onset of left lower leg pain, erythema and swelling. Initial encounter. EXAM: LEFT TIBIA AND FIBULA - 2 VIEW COMPARISON:  Left knee radiographs performed 07/06/2014 FINDINGS: There is no evidence of fracture or dislocation. The left tibia and fibula appear intact. Mild diffuse joint space narrowing is noted at the knee, with prominent subcortical cysts at the medial tibial plateau. No significant knee joint effusion is identified. Medial soft tissue swelling is noted about the lower leg. The ankle mortise appears grossly intact. IMPRESSION: 1. No evidence of fracture or dislocation. 2. Tricompartmental osteoarthritis at the left knee, most prominent at the medial compartment. Electronically Signed   By: 11/06/2016 M.D.   On: 09/06/2016 21:33   Dg Chest Port 1 View  Result Date: 09/06/2016 CLINICAL DATA:  Cellulitis, left-sided sharp chest pain yesterday. Chronic  dyspnea. EXAM: PORTABLE CHEST 1 VIEW COMPARISON:  None. FINDINGS: A large hiatal hernia projects over the cardiac silhouette. There is aortic atherosclerosis at the arch with uncoiling of the thoracic aorta. The heart is enlarged but stable. There is mild interstitial edema. Probable tiny pleural effusions bilaterally. Patient is rotated on current exam. Left-sided pacemaker apparatus projects over the periphery of the left hemithorax with right atrial and right ventricular leads identified. No acute nor suspicious osseous abnormality. Degenerative changes are seen along dorsal spine. IMPRESSION: 1. Stable cardiomegaly and aortic atherosclerosis. 2. Mild interstitial edema with trace bilateral pleural effusions. 3. Large hiatal hernia. Electronically Signed  By: Tollie Eth M.D.   On: 09/06/2016 18:35    Scheduled Meds: . amiodarone  200 mg Oral Daily  . carbidopa-levodopa  1 tablet Oral BID WC  . diltiazem  120 mg Oral q1800  . pantoprazole  40 mg Oral Daily  . rOPINIRole  0.5 mg Oral QHS  . senna  1 tablet Oral BID  . tiZANidine  2 mg Oral Daily  . warfarin  3 mg Oral ONCE-1800  . Warfarin - Pharmacist Dosing Inpatient   Does not apply q1800    Continuous Infusions: . sodium chloride 75 mL/hr at 09/07/16 0008  . piperacillin-tazobactam (ZOSYN)  IV 3.375 g (09/07/16 0540)  . vancomycin       Time spent: 35 mins  I have personally reviewed and interpreted daily labs, tele strips, ekg, prior echo result, left leg x rayimagings as discussed above under date review session and assessment and plans.  I have personally reviewed outpatient cardiology notes.  Galo Sayed MD, PhD  [facility] Pager [phone]. If 7PM-7AM, please contact night-coverage at [email or web address], password [facility] 09/07/2016, 7:42 AM  LOS: 1 day

## 2016-09-07 NOTE — Progress Notes (Signed)
CRITICAL VALUE ALERT  Critical Value:  Lactic acid 3.2  Date & Time Notied:  August 8th, 2018 @ 1113  Provider Notified: Roda Shutters  Orders Received/Actions taken: New orders received.

## 2016-09-07 NOTE — Progress Notes (Addendum)
ANTICOAGULATION CONSULT NOTE   Pharmacy Consult for Warfarin  Indication: atrial fibrillation  Allergies  Allergen Reactions  . Lipitor [Atorvastatin] Other (See Comments)    Myalgias (noted in neurologist note on 08/23/2013 when admitted for acute CVA).  08/20/15:  Pravastatin started at that time. However, patient is not taking Pravastatin/told okay to stop it by PCP. [facility] said it was last filled in 07/2013)   . Morphine And Related Other (See Comments)    hallucinations    Patient Measurements: Height: 5' (152.4 cm) Weight: 187 lb (84.8 kg) IBW/kg (Calculated) : 45.5  Vital Signs: Temp: 97.7 F (36.5 C) (08/08 0600) Temp Source: Oral (08/08 0600) BP: 107/59 (08/08 0732) Pulse Rate: 39 (08/08 0732)  Labs:  Recent Labs  09/06/16 1810 09/06/16 2052 09/07/16 0000 09/07/16 0226  HGB 15.5* 15.0 17.4* 13.4  HCT 45.7 44.0 52.5* 41.2  PLT 266  --  133* 232  LABPROT 26.3*  --   --  24.5*  INR 2.36  --   --  2.17  CREATININE 1.18* 1.00 1.04* 0.96    Estimated Creatinine Clearance: 36.9 mL/min (by C-G formula based on SCr of 0.96 mg/dL).    Assessment: 83 y.o. female admitted with cellulitis. Patient with history of afib on chronic warfarin . Pharmacy has been consulted to continue warfarin. INR this morning is within range at 2.17.  CBC is within normal limits. No signs of bleeding have been noted.   Patient is on amiodarone and ropinirole as prior to admission which can raise warfarin levels.    PTA dose: 3 mg every day except 5 mg on Monday and Thursday . Last dose: 8/7.   Goal of Therapy:  INR 2-3 Monitor platelets by anticoagulation protocol: Yes   Plan:  Warfarin 3 mg X 1 tonight as per home dose Daily INR Monitor for signs/symptoms of bleeding   Della Goo, PharmD PGY2 Infectious Diseases Pharmacy Resident Pager: [phone]  09/07/2016,7:35 AM

## 2016-09-08 DIAGNOSIS — I255 Ischemic cardiomyopathy: Secondary | ICD-10-CM | POA: Diagnosis not present

## 2016-09-08 DIAGNOSIS — I4891 Unspecified atrial fibrillation: Secondary | ICD-10-CM | POA: Diagnosis not present

## 2016-09-08 DIAGNOSIS — I509 Heart failure, unspecified: Secondary | ICD-10-CM | POA: Diagnosis not present

## 2016-09-08 DIAGNOSIS — Z789 Other specified health status: Secondary | ICD-10-CM | POA: Diagnosis not present

## 2016-09-08 DIAGNOSIS — I5033 Acute on chronic diastolic (congestive) heart failure: Secondary | ICD-10-CM

## 2016-09-08 LAB — CBC WITH DIFFERENTIAL/PLATELET
BASOS PCT: 0 %
Basophils Absolute: 0 10*3/uL (ref 0.0–0.1)
Eosinophils Absolute: 0 10*3/uL (ref 0.0–0.7)
Eosinophils Relative: 0 %
HEMATOCRIT: 42 % (ref 36.0–46.0)
HEMOGLOBIN: 13.8 g/dL (ref 12.0–15.0)
LYMPHS ABS: 1.7 10*3/uL (ref 0.7–4.0)
LYMPHS PCT: 14 %
MCH: 32 pg (ref 26.0–34.0)
MCHC: 32.9 g/dL (ref 30.0–36.0)
MCV: 97.4 fL (ref 78.0–100.0)
MONO ABS: 0.9 10*3/uL (ref 0.1–1.0)
MONOS PCT: 7 %
NEUTROS ABS: 9.8 10*3/uL — AB (ref 1.7–7.7)
NEUTROS PCT: 79 %
Platelets: 245 10*3/uL (ref 150–400)
RBC: 4.31 MIL/uL (ref 3.87–5.11)
RDW: 14.7 % (ref 11.5–15.5)
WBC: 12.4 10*3/uL — ABNORMAL HIGH (ref 4.0–10.5)

## 2016-09-08 LAB — COMPREHENSIVE METABOLIC PANEL
ALK PHOS: 74 U/L (ref 38–126)
ALT: 6 U/L — ABNORMAL LOW (ref 14–54)
AST: 17 U/L (ref 15–41)
Albumin: 2.5 g/dL — ABNORMAL LOW (ref 3.5–5.0)
Anion gap: 10 (ref 5–15)
BILIRUBIN TOTAL: 0.8 mg/dL (ref 0.3–1.2)
BUN: 15 mg/dL (ref 6–20)
CALCIUM: 8.3 mg/dL — AB (ref 8.9–10.3)
CO2: 19 mmol/L — ABNORMAL LOW (ref 22–32)
Chloride: 108 mmol/L (ref 101–111)
Creatinine, Ser: 0.91 mg/dL (ref 0.44–1.00)
GFR, EST NON AFRICAN AMERICAN: 54 mL/min — AB (ref >60)
GLUCOSE: 131 mg/dL — AB (ref 65–99)
Potassium: 3.7 mmol/L (ref 3.5–5.1)
Sodium: 137 mmol/L (ref 135–145)
TOTAL PROTEIN: 6 g/dL — AB (ref 6.5–8.1)

## 2016-09-08 LAB — PROTIME-INR
INR: 2.43
PROTHROMBIN TIME: 26.9 s — AB (ref 11.4–15.2)

## 2016-09-08 LAB — MAGNESIUM: MAGNESIUM: 1.9 mg/dL (ref 1.7–2.4)

## 2016-09-08 MED ORDER — WARFARIN SODIUM 3 MG PO TABS
3.0000 mg | ORAL_TABLET | Freq: Once | ORAL | Status: AC
  Administered 2016-09-08: 3 mg via ORAL
  Filled 2016-09-08: qty 1

## 2016-09-08 MED ORDER — METOPROLOL TARTRATE 25 MG PO TABS
25.0000 mg | ORAL_TABLET | Freq: Two times a day (BID) | ORAL | Status: DC
  Administered 2016-09-08 – 2016-09-10 (×5): 25 mg via ORAL
  Filled 2016-09-08 (×5): qty 1

## 2016-09-08 MED ORDER — FUROSEMIDE 10 MG/ML IJ SOLN
40.0000 mg | Freq: Once | INTRAMUSCULAR | Status: AC
  Administered 2016-09-08: 40 mg via INTRAVENOUS
  Filled 2016-09-08: qty 4

## 2016-09-08 NOTE — Progress Notes (Signed)
Advanced Heart Failure Rounding Note  PCP: Dr Yetta Flock  Primary Cardiologist: Dr Gala Romney   Subjective:    Admitted 09/06/16 with sepsis due to LLE cellulitis. Up and in chair this am. Says she is SOB at rest and with getting to the chair. Weight up to 199 pounds. Complains of pain in her leg.    Objective:   Weight Range: 199 lb (90.3 kg) Body mass index is 38.86 kg/m.   Vital Signs:   Temp:  [97.6 F (36.4 C)-98.1 F (36.7 C)] 98.1 F (36.7 C) (08/09 0748) Pulse Rate:  [59-117] 117 (08/09 0748) Resp:  [18-26] 26 (08/09 0748) BP: (82-162)/(55-96) 162/88 (08/09 0748) SpO2:  [92 %-96 %] 96 % (08/09 0748) Weight:  [199 lb (90.3 kg)] 199 lb (90.3 kg) (08/09 0600) Last BM Date: 09/06/16  Weight change: Filed Weights   09/06/16 1730 09/08/16 0600  Weight: 187 lb (84.8 kg) 199 lb (90.3 kg)    Intake/Output:   Intake/Output Summary (Last 24 hours) at 09/08/16 2202 Last data filed at 09/08/16 0700  Gross per 24 hour  Intake              800 ml  Output              300 ml  Net              500 ml      Physical Exam    General:  Elderly female, SOB sitting in chair.  HEENT: Normal Neck: Supple. JVP to jaw . Carotids 2+ bilat; no bruits. No lymphadenopathy or thyromegaly appreciated. Cor: PMI nondisplaced. irregular rate & rhythm. No rubs, gallops or murmurs. Lungs: Clear in upper lobes, diminished in bases Abdomen: Soft, nontender, nondistended. No hepatosplenomegaly. No bruits or masses. Good bowel sounds. Extremities: No cyanosis, clubbing, rash. LLE erythema, 1+ pedal edema bilaterally.  Neuro: Alert & orientedx3, cranial nerves grossly intact. moves all 4 extremities w/o difficulty. Affect pleasant   Telemetry   Afib rates in the 90's- Personally reviewed.   EKG    09/07/16 - Afib 130 bpm - personally reviewed.   Labs    CBC  Recent Labs  09/07/16 0000 09/07/16 0226 09/08/16 0437  WBC 15.5* 17.8* 12.4*  NEUTROABS 11.7*  --  9.8*  HGB 17.4* 13.4  13.8  HCT 52.5* 41.2 42.0  MCV 97.0 96.3 97.4  PLT 133* 232 245   Basic Metabolic Panel  Recent Labs  09/07/16 0226 09/08/16 0437  NA 138 137  K 3.4* 3.7  CL 109 108  CO2 21* 19*  GLUCOSE 125* 131*  BUN 12 15  CREATININE 0.96 0.91  CALCIUM 8.1* 8.3*  MG 1.9 1.9  PHOS 2.9  --    Liver Function Tests  Recent Labs  09/07/16 0226 09/08/16 0437  AST 20 17  ALT 10* 6*  ALKPHOS 69 74  BILITOT 1.2 0.8  PROT 5.6* 6.0*  ALBUMIN 2.5* 2.5*    BNP: BNP (last 3 results)  Recent Labs  07/13/16 1515  BNP 367.4*    Thyroid Function Tests  Recent Labs  09/07/16 0226  TSH 3.623    Other results:   Imaging   Left Lower Extremity Venous Duplex Evaluation 09/06/16 - Technically difficult to image due to pain in the left lower leg. - No evidence of deep vein or superficial thrombosis involving the   left lower extremity and right common femoral vein. - No evidence of Baker&'s cyst on the left.    Medications:  Scheduled Medications: . amiodarone  200 mg Oral Daily  . carbidopa-levodopa  1 tablet Oral BID WC  . diltiazem  120 mg Oral q1800  . hydrocortisone sod succinate (SOLU-CORTEF) inj  50 mg Intravenous Q8H  . pantoprazole  40 mg Oral Daily  . rOPINIRole  0.5 mg Oral QHS  . senna  1 tablet Oral BID  . tiZANidine  2 mg Oral Daily  . warfarin  3 mg Oral ONCE-1800  . Warfarin - Pharmacist Dosing Inpatient   Does not apply q1800     Infusions: . piperacillin-tazobactam (ZOSYN)  IV 3.375 g (09/08/16 0606)  . vancomycin Stopped (09/07/16 1915)     PRN Medications:  acetaminophen **OR** acetaminophen, HYDROcodone-acetaminophen, ondansetron **OR** ondansetron (ZOFRAN) IV, polyethylene glycol    Patient Profile   Ms Montrose is a 82 year old with a history of diastolic heart failure, chronic a fib complicated by tachybrady syndrome, S/P pacemaker 2010, HTN, GERD, CVA 2015 admitted with sepsis with LLE cellulitis.   Assessment/Plan   1. Sepsis:  Source felt to LLE cellulitis. BP improved today, lactic acid 2.7 last night. Leukocytosis improved at 12.4.  - Continue vancomycin and Zosyn - Blood cultures no growth. If cultures come back + for staph, need to consider device extraction.   2. Acute on chronic diastolic CHF:  - Volume overloaded on exam, SOB.  - Give 40 mg IV lasix once. Will reassess volume status this afternoon.  - No beta blocker with volume overload.   3. Chronic atrial fibrillation - Rate improved.  - Continue diltiazem and Amio  4. History of CVA  - No residual  - Continue warfarin. Has an allergy to statins.    Length of Stay: 2   Little Ishikawa, NP  09/08/2016, 9:27 AM  Advanced Heart Failure Team Pager [phone] (M-F; 7a - 4p)  Please contact [facility] for night-coverage after hours (4p -7a ) and weekends on [email or web address]  Patient seen and examined with Suzzette Righter, NP. We discussed all aspects of the encounter. I agree with the assessment and plan as stated above.   Sepsis improving. BP more stable. Lactate coming down. AF rate improved. Volume status elevated - weight up 10+ pounds. Will give one dose IV lasix today and assess response. Will likely need more lasix tonight and tomorrow. Stop IVF.   Arvilla Meres, MD  12:33 PM

## 2016-09-08 NOTE — Progress Notes (Signed)
PROGRESS NOTE  TARRAH FURUTA BPZ:[id number] DOB: December 05, 1923 DOA: 09/06/2016 PCP: Charlott Rakes, MD  HPI/Recap of past 24 hours:  Report left leg pain is better, now able to bear weight , she did well with physical therapy today She is in afib/rvr this am, heart rate 120-130's , bp now starts to be elevated, restarted lopressor at a lower dose  Report last bm was on monday  chronic tremor is better   Assessment/Plan: Active Problems:   Essential hypertension   Chronic atrial fibrillation (HCC)   Chronic kidney disease   Chronic diastolic heart failure (HCC)   Chest pain   Sepsis (HCC)   Cellulitis   Atrial fibrillation with RVR (HCC)   Tremor  Sepsis presented on admission  with left lower leg cellulitis ( circumferential erythema /edema involves more than 80% of her left lower leg, extending up to left knee and extending down to left foot),  leukocytosis ( wbc 23.8), lactic acidosis (lactic acid 3.04), tachycardia, heart rate  110's,  -Blood culture no growth on day one, mrsa screening negative -she received ivf initially, now off ivf, she required stress dose steroids briefly on 8/8, now bp has improved, d/c stress dose steroids on 8/9. -She is started on vanc/sozyn since admission, will discontinue vanc and continue zosyn,  deescalating abx to oral meds if patient continue to improve. ( devise extraction is recommended by cardiology if blood culture + for staph, so far blood culture has been negative)  left lower leg cellulitis ( circumferential erythema /edema involves more than 80% of her left lower leg, extending up to left knee and extending down to left foot),  -Left lower leg x ray no fracture, no mention of osteo -Left lower extremity doppler no DVT - abx adjustment as above, follow up on culture result  AFib/RVR; s/p pace maker -I have personally reviewed ekg and tele daily, she remains in afib, she has afib/rvr early this am -coumadin per pharmacy  -continue  cardizem (with holding parameters), continue amiodarone, home meds lopressor restarted at a lower dose today on 8/9 now that her blood pressure has improved.  -cardiology consulted. Input appreciated  Diastolic chf:  - home meds lasix held in the setting of sepsis -sepsis physiology is resolving, volume seems up a bit, diuretics per cardiology -cardiology input appreciated  CKDIII, cr/gfr close to baseline, renal dosing meds  H/o HTN,  She presented with borderline low  bp due to sepsis, required stress dose steroids breifly continue cardizem (with holding parameters),   home meds lopressor held since admission due to borderline bp, resumed at lower dose on 8/9  H/o RA on plaquenil/chronically immunosupressed status, held in the setting of sepsis, consider resumption prior to discharge.  Code Status: full  Family Communication: patient   Disposition Plan: transfer to med tele, anticipate discharge home with home health once culture result finalized and with cardiology clearance.   Consultants:  cardiology  Procedures:  none  Antibiotics:  Vanc since admission  To 8/9  zosyn from admission   Objective: BP (!) 127/96   Pulse 65   Temp 97.6 F (36.4 C) (Oral)   Resp (!) 23   Ht 5' (1.524 m)   Wt 90.3 kg (199 lb)   SpO2 96%   BMI 38.86 kg/m   Intake/Output Summary (Last 24 hours) at 09/08/16 0745 Last data filed at 09/08/16 0254  Gross per 24 hour  Intake  750 ml  Output              300 ml  Net              450 ml   Filed Weights   09/06/16 1730 09/08/16 0600  Weight: 84.8 kg (187 lb) 90.3 kg (199 lb)    Exam: Patient is examined daily including today on 09/08/2016, exam remain the same as of yesterday except that is highlighted below   General:  Frail elderly, NAD  Cardiovascular: IRRR  Respiratory: CTABL  Abdomen: Soft/ND/NT, positive BS  Musculoskeletal:left lower extremity Edema is improving,    Neuro: chronic tremor, residual  left lower extremity weakness from prior cva  Data Reviewed: Basic Metabolic Panel:  Recent Labs Lab 09/06/16 1810 09/06/16 2052 09/07/16 0000 09/07/16 0226 09/08/16 0437  NA 136 139 138 138 137  K 3.8 3.8 3.9 3.4* 3.7  CL 104 104 108 109 108  CO2 24  --  24 21* 19*  GLUCOSE 99 101* 103* 125* 131*  BUN 13 15 13 12 15   CREATININE 1.18* 1.00 1.04* 0.96 0.91  CALCIUM 9.1  --  8.1* 8.1* 8.3*  MG  --   --   --  1.9 1.9  PHOS  --   --   --  2.9  --    Liver Function Tests:  Recent Labs Lab 09/06/16 1810 09/07/16 0000 09/07/16 0226 09/08/16 0437  AST 26 20 20 17   ALT 8* 10* 10* 6*  ALKPHOS 83 64 69 74  BILITOT 1.4* 1.4* 1.2 0.8  PROT 6.5 5.5* 5.6* 6.0*  ALBUMIN 3.0* 2.5* 2.5* 2.5*   No results for input(s): LIPASE, AMYLASE in the last 168 hours. No results for input(s): AMMONIA in the last 168 hours. CBC:  Recent Labs Lab 09/06/16 1810 09/06/16 2052 09/07/16 0000 09/07/16 0226 09/08/16 0437  WBC 23.8*  --  15.5* 17.8* 12.4*  NEUTROABS 17.1*  --  11.7*  --  9.8*  HGB 15.5* 15.0 17.4* 13.4 13.8  HCT 45.7 44.0 52.5* 41.2 42.0  MCV 96.2  --  97.0 96.3 97.4  PLT 266  --  133* 232 245   Cardiac Enzymes:   No results for input(s): CKTOTAL, CKMB, CKMBINDEX, TROPONINI in the last 168 hours. BNP (last 3 results)  Recent Labs  07/13/16 1515  BNP 367.4*    ProBNP (last 3 results) No results for input(s): PROBNP in the last 8760 hours.  CBG: No results for input(s): GLUCAP in the last 168 hours.  Recent Results (from the past 240 hour(s))  Blood Culture (routine x 2)     Status: None (Preliminary result)   Collection Time: 09/06/16  6:10 PM  Result Value Ref Range Status   Specimen Description BLOOD RIGHT ANTECUBITAL  Final   Special Requests   Final    BOTTLES DRAWN AEROBIC AND ANAEROBIC Blood Culture adequate volume   Culture NO GROWTH < 24 HOURS  Final   Report Status PENDING  Incomplete  Blood Culture (routine x 2)     Status: None (Preliminary result)    Collection Time: 09/06/16  6:48 PM  Result Value Ref Range Status   Specimen Description BLOOD RIGHT WRIST  Final   Special Requests   Final    BOTTLES DRAWN AEROBIC AND ANAEROBIC Blood Culture adequate volume   Culture NO GROWTH < 24 HOURS  Final   Report Status PENDING  Incomplete  MRSA PCR Screening     Status: None   Collection  Time: 09/06/16 10:36 PM  Result Value Ref Range Status   MRSA by PCR NEGATIVE NEGATIVE Final    Comment:        The GeneXpert MRSA Assay (FDA approved for NASAL specimens only), is one component of a comprehensive MRSA colonization surveillance program. It is not intended to diagnose MRSA infection nor to guide or monitor treatment for MRSA infections.      Studies: No results found.  Scheduled Meds: . amiodarone  200 mg Oral Daily  . carbidopa-levodopa  1 tablet Oral BID WC  . diltiazem  120 mg Oral q1800  . hydrocortisone sod succinate (SOLU-CORTEF) inj  50 mg Intravenous Q8H  . pantoprazole  40 mg Oral Daily  . rOPINIRole  0.5 mg Oral QHS  . senna  1 tablet Oral BID  . tiZANidine  2 mg Oral Daily  . Warfarin - Pharmacist Dosing Inpatient   Does not apply q1800    Continuous Infusions: . piperacillin-tazobactam (ZOSYN)  IV 3.375 g (09/08/16 0606)  . vancomycin Stopped (09/07/16 1915)     Time spent: 35 mins  I have personally reviewed and interpreted daily labs, tele strips, ekg,  as discussed above under date review session and assessment and plans.    Joaquin Knebel MD, PhD  [facility] Pager [phone]. If 7PM-7AM, please contact night-coverage at [email or web address], password [facility] 09/08/2016, 7:45 AM  LOS: 2 days

## 2016-09-08 NOTE — Evaluation (Addendum)
Occupational Therapy Evaluation Patient Details Name: [name] MRN: [id number] DOB: 04/18/1923 Today's Date: 09/08/2016    History of Present Illness Pt adm with sepsis and LLE cellulitis. PMH - afib, pacer, chf, htn, CVA, cad   Clinical Impression   This 83 yo female admitted for above presents to acute OT with deficits below (see OT problem list) thus affecting her PLOF of being Independent with basic ADLs and IADls. She will benefit from acute OT with follow up HHOT.     Follow Up Recommendations  Home health OT;Supervision/Assistance - 24 hour    Equipment Recommendations  3 in 1 bedside commode        Precautions / Restrictions Precautions Precautions: Fall Precaution Comments: monitor BP Restrictions Weight Bearing Restrictions: No LLE Weight Bearing: Weight bearing as tolerated      Mobility Bed Mobility Overal bed mobility: Modified Independent             General bed mobility comments: Incr time  Transfers Overall transfer level: Needs assistance Equipment used: Rolling walker (2 wheeled) Transfers: Sit to/from [employer] Sit to Stand: Min assist Stand pivot transfers: Min assist            Balance Overall balance assessment: Needs assistance Sitting-balance support: No upper extremity supported;Feet supported Sitting balance-Leahy Scale: Normal     Standing balance support: Bilateral upper extremity supported;During functional activity Standing balance-Leahy Scale: Poor Standing balance comment: walker and min assist due to pain in LLE                           ADL either performed or assessed with clinical judgement   ADL Overall ADL's : Needs assistance/impaired Eating/Feeding: Independent;Sitting   Grooming: Set up;Sitting   Upper Body Bathing: Set up;Sitting   Lower Body Bathing: Minimal assistance;Sit to/from stand   Upper Body Dressing : Set up;Sitting   Lower Body Dressing: Moderate  assistance Lower Body Dressing Details (indicate cue type and reason): min A sit<>stand Toilet Transfer: Minimal assistance;Ambulation;RW   Toileting- Clothing Manipulation and Hygiene: Minimal assistance;Sit to/from stand               Vision Patient Visual Report: No change from baseline                Pertinent Vitals/Pain Pain Assessment: 0-10 Pain Score: 7  Pain Location: RLE Pain Descriptors / Indicators: Grimacing;Sore Pain Intervention(s): Monitored during session;Repositioned     Hand Dominance Right   Extremity/Trunk Assessment Upper Extremity Assessment Upper Extremity Assessment: Overall WFL for tasks assessed  Pt with h/o tremors           Communication Communication Communication: HOH   Cognition Arousal/Alertness: Awake/alert Behavior During Therapy: WFL for tasks assessed/performed Overall Cognitive Status: Within Functional Limits for tasks assessed                                                   Home Living Family/patient expects to be discharged to:: Private residence Living Arrangements: Other relatives (great grand daughter) Available Help at Discharge: Family;Available 24 hours/day Type of Home: Mobile home Home Access: Stairs to enter Entrance Stairs-Number of Steps: 1 Entrance Stairs-Rails: Right;Left Home Layout: One level     Bathroom Shower/Tub: Producer, television/film/video: Standard     Home Equipment: Environmental consultant -  2 wheels;Shower seat - built in;Hand held shower head          Prior Functioning/Environment Level of Independence: Independent                 OT Problem List: Decreased range of motion;Obesity;Pain;Impaired balance (sitting and/or standing)      OT Treatment/Interventions: Self-care/ADL training;Patient/family education;DME and/or AE instruction;Balance training    OT Goals(Current goals can be found in the care plan section) Acute Rehab OT Goals Patient Stated Goal:  return home OT Goal Formulation: With patient Time For Goal Achievement: 09/15/16 Potential to Achieve Goals: Good ADL Goals Pt Will Perform Grooming: with supervision;standing Pt Will Perform Upper Body Bathing: with supervision;sitting;standing Pt Will Perform Lower Body Bathing: with supervision;sit to/from stand Pt Will Perform Upper Body Dressing: with supervision;sitting;standing Pt Will Perform Lower Body Dressing: with supervision;sit to/from stand Pt Will Transfer to Toilet: with supervision;ambulating;bedside commode (over toilet) Pt Will Perform Toileting - Clothing Manipulation and hygiene: with supervision;sit to/from stand  OT Frequency:                AM-PAC PT "6 Clicks" Daily Activity     Outcome Measure Help from another person eating meals?: None Help from another person taking care of personal grooming?: A Little Help from another person toileting, which includes using toliet, bedpan, or urinal?: A Little Help from another person bathing (including washing, rinsing, drying)?: A Little Help from another person to put on and taking off regular upper body clothing?: A Little Help from another person to put on and taking off regular lower body clothing?: A Lot 6 Click Score: 18   End of Session Equipment Utilized During Treatment: Gait belt;Rolling walker Nurse Communication:  (BP high when entered, lower when I took it before session started, and then higher again at end of session)  Activity Tolerance: Patient tolerated treatment well Patient left: in chair;with call bell/phone within reach;with chair alarm set  OT Visit Diagnosis: Unsteadiness on feet (R26.81);Pain Pain - Right/Left: Left Pain - part of body: Leg                Time: 3295-1884 OT Time Calculation (min): 26 min Charges:  OT General Charges $OT Visit: 1 Procedure OT Evaluation $OT Eval Moderate Complexity: 1 Procedure OT Treatments $Self Care/Home Management : 8-22 mins Ignacia Palma,  OTR/L [phone] 09/08/2016

## 2016-09-08 NOTE — Progress Notes (Signed)
Physical Therapy Treatment Patient Details Name: [name] MRN: [id number] DOB: 1923/05/05 Today's Date: 09/08/2016    History of Present Illness Pt adm with sepsis and LLE cellulitis. PMH - afib, pacer, chf, htn, CVA, cad    PT Comments    Pt with much improved with mobility as LLE has improved. Doubt she will need PT at DC. Will continue to follow acutely.   Follow Up Recommendations  No PT follow up;Supervision - Intermittent     Equipment Recommendations  None recommended by PT    Recommendations for Other Services       Precautions / Restrictions Precautions Precautions: None Precaution Comments: monitor BP Restrictions Weight Bearing Restrictions: No LLE Weight Bearing: Weight bearing as tolerated    Mobility  Bed Mobility Overal bed mobility: Modified Independent             General bed mobility comments: Pt up in chair  Transfers Overall transfer level: Needs assistance Equipment used: Rolling walker (2 wheeled) Transfers: Sit to/from Stand Sit to Stand: Min guard Stand pivot transfers: Min assist       General transfer comment: Assist for safety  Ambulation/Gait Ambulation/Gait assistance: Supervision Ambulation Distance (Feet): 170 Feet Assistive device: Rolling walker (2 wheeled) Gait Pattern/deviations: Decreased stance time - left;Antalgic;Step-through pattern     General Gait Details: Assist for safety. Slight limp but not very pronounced.   Stairs            Wheelchair Mobility    Modified Rankin (Stroke Patients Only)       Balance Overall balance assessment: Needs assistance Sitting-balance support: No upper extremity supported;Feet supported Sitting balance-Leahy Scale: Normal     Standing balance support: No upper extremity supported Standing balance-Leahy Scale: Fair Standing balance comment: walker and min assist due to pain in LLE                            Cognition Arousal/Alertness:  Awake/alert Behavior During Therapy: WFL for tasks assessed/performed Overall Cognitive Status: Within Functional Limits for tasks assessed                                        Exercises      General Comments        Pertinent Vitals/Pain Pain Assessment: 0-10 Pain Score: 2  Pain Location: RLE Pain Descriptors / Indicators: Sore Pain Intervention(s): Monitored during session    Home Living Family/patient expects to be discharged to:: Private residence Living Arrangements: Other relatives (great grand daughter) Available Help at Discharge: Family;Available 24 hours/day Type of Home: Mobile home Home Access: Stairs to enter Entrance Stairs-Rails: Right;Left Home Layout: One level Home Equipment: Environmental consultant - 2 wheels;Shower seat - built in;Hand held shower head      Prior Function Level of Independence: Independent          PT Goals (current goals can now be found in the care plan section) Acute Rehab PT Goals Patient Stated Goal: return home Progress towards PT goals: Goals met and updated - see care plan    Frequency    Min 3X/week      PT Plan Discharge plan needs to be updated    Co-evaluation              AM-PAC PT "6 Clicks" Daily Activity  Outcome Measure  Difficulty turning over in bed (including  adjusting bedclothes, sheets and blankets)?: None Difficulty moving from lying on back to sitting on the side of the bed? : None Difficulty sitting down on and standing up from a chair with arms (e.g., wheelchair, bedside commode, etc,.)?: A Little Help needed moving to and from a bed to chair (including a wheelchair)?: A Little Help needed walking in hospital room?: A Little Help needed climbing 3-5 steps with a railing? : A Little 6 Click Score: 20    End of Session Equipment Utilized During Treatment: Gait belt Activity Tolerance: Patient tolerated treatment well Patient left: in chair;with call bell/phone within reach;with chair  alarm set Nurse Communication: Mobility status PT Visit Diagnosis: Unsteadiness on feet (R26.81);Other abnormalities of gait and mobility (R26.89);Pain Pain - Right/Left: Left Pain - part of body: Leg     Time: 1798-1025 PT Time Calculation (min) (ACUTE ONLY): 14 min  Charges:  $Gait Training: 8-22 mins                    G Codes:       [facility] PT [place] 09/08/2016, 1:44 PM

## 2016-09-08 NOTE — Care Management Note (Addendum)
Case Management Note  Patient Details  Name: [name] MRN: [id number] Date of Birth: [birth year]  Subjective/Objective:    From home with great grand daughter she is home all day with her, she works from home, her grand daughter lives right across from her also.  She has a walker at home and would like to have a bsc, presents with Sepsis secondary to LLE cellulitis, has hx of CHF, chronic afib, s/p pacemaker 2010.  Conts on Vanc/ Zosyn.  Per pt eval no PT f/u needed.                 Action/Plan: NCM will follow for dc needs.   Expected Discharge Date:                  Expected Discharge Plan:     In-House Referral:     Discharge planning Services  CM Consult  Post Acute Care Choice:    Choice offered to:     DME Arranged:    DME Agency:     HH Arranged:    HH Agency:     Status of Service:  In process, will continue to follow  If discussed at Long Length of Stay Meetings, dates discussed:    Additional Comments:  Leone Haven, RN 09/08/2016, 10:20 AM

## 2016-09-08 NOTE — Progress Notes (Signed)
ANTICOAGULATION CONSULT NOTE - Follow-Up  Pharmacy Consult for Warfarin  Indication: atrial fibrillation  Patient Measurements: Height: 5' (152.4 cm) Weight: 199 lb (90.3 kg) IBW/kg (Calculated) : 45.5  Vital Signs: Temp: 98.1 F (36.7 C) (08/09 0748) Temp Source: Oral (08/09 0748) BP: 162/88 (08/09 0748) Pulse Rate: 117 (08/09 0748)  Labs:  Recent Labs  09/06/16 1810  09/07/16 0000 09/07/16 0226 09/08/16 0437  HGB 15.5*  < > 17.4* 13.4 13.8  HCT 45.7  < > 52.5* 41.2 42.0  PLT 266  --  133* 232 245  LABPROT 26.3*  --   --  24.5* 26.9*  INR 2.36  --   --  2.17 2.43  CREATININE 1.18*  < > 1.04* 0.96 0.91  < > = values in this interval not displayed.  Estimated Creatinine Clearance: 40.3 mL/min (by C-G formula based on SCr of 0.91 mg/dL).   Assessment: 83 y.o. female admitted with cellulitis. Patient with history of afib on chronic warfarin . Pharmacy has been consulted to continue warfarin.  INR today remains therapeutic (INR 2.43 << 2.17, goal of 2-3). CBC wnl, no signs/symptoms of bleeding are noted at this time.  Patient is on amiodarone and ropinirole as prior to admission which can raise warfarin levels.    PTA dose: 3 mg every day except 5 mg on Monday and Thursday . Last dose: 8/7.   Goal of Therapy:  INR 2-3 Monitor platelets by anticoagulation protocol: Yes   Plan:  1. Repeat Warfarin 3 mg x 1 dose at 1800 today 2. Will continue to monitor for any signs/symptoms of bleeding and will follow up with PT/INR in the a.m.   Thank you for allowing pharmacy to be a part of this patient's care.  Georgina Pillion, PharmD, BCPS Clinical Pharmacist Pager: [phone] Clinical phone for 09/08/2016 from 7a-3:30p: [phone] If after 3:30p, please call main pharmacy at: [phone] 09/08/2016 8:58 AM

## 2016-09-09 DIAGNOSIS — E876 Hypokalemia: Secondary | ICD-10-CM

## 2016-09-09 DIAGNOSIS — D899 Disorder involving the immune mechanism, unspecified: Secondary | ICD-10-CM

## 2016-09-09 DIAGNOSIS — R627 Adult failure to thrive: Secondary | ICD-10-CM

## 2016-09-09 LAB — CBC
HEMATOCRIT: 41.2 % (ref 36.0–46.0)
Hemoglobin: 14 g/dL (ref 12.0–15.0)
MCH: 32.3 pg (ref 26.0–34.0)
MCHC: 34 g/dL (ref 30.0–36.0)
MCV: 94.9 fL (ref 78.0–100.0)
Platelets: 260 10*3/uL (ref 150–400)
RBC: 4.34 MIL/uL (ref 3.87–5.11)
RDW: 14 % (ref 11.5–15.5)
WBC: 10 10*3/uL (ref 4.0–10.5)

## 2016-09-09 LAB — BASIC METABOLIC PANEL
Anion gap: 12 (ref 5–15)
Anion gap: 11 (ref 5–15)
BUN: 17 mg/dL (ref 6–20)
BUN: 18 mg/dL (ref 6–20)
CALCIUM: 8.3 mg/dL — AB (ref 8.9–10.3)
CALCIUM: 8.4 mg/dL — AB (ref 8.9–10.3)
CO2: 21 mmol/L — ABNORMAL LOW (ref 22–32)
CO2: 20 mmol/L — AB (ref 22–32)
CREATININE: 1.19 mg/dL — AB (ref 0.44–1.00)
Chloride: 105 mmol/L (ref 101–111)
Chloride: 107 mmol/L (ref 101–111)
Creatinine, Ser: 0.96 mg/dL (ref 0.44–1.00)
GFR calc Af Amer: 58 mL/min — ABNORMAL LOW (ref >60)
GFR calc Af Amer: 45 mL/min — ABNORMAL LOW (ref >60)
GFR, EST NON AFRICAN AMERICAN: 50 mL/min — AB (ref >60)
GFR, EST NON AFRICAN AMERICAN: 39 mL/min — AB (ref >60)
GLUCOSE: 76 mg/dL (ref 65–99)
GLUCOSE: 96 mg/dL (ref 65–99)
POTASSIUM: 2.9 mmol/L — AB (ref 3.5–5.1)
Potassium: 3.6 mmol/L (ref 3.5–5.1)
SODIUM: 138 mmol/L (ref 135–145)
Sodium: 138 mmol/L (ref 135–145)

## 2016-09-09 LAB — MAGNESIUM: MAGNESIUM: 1.8 mg/dL (ref 1.7–2.4)

## 2016-09-09 LAB — PROTIME-INR
INR: 3.12
Prothrombin Time: 32.8 seconds — ABNORMAL HIGH (ref 11.4–15.2)

## 2016-09-09 MED ORDER — FUROSEMIDE 40 MG PO TABS: 40.0000 mg | ORAL_TABLET | Freq: Every day | ORAL | Status: DC

## 2016-09-09 MED ORDER — FUROSEMIDE 10 MG/ML IJ SOLN
40.0000 mg | Freq: Once | INTRAMUSCULAR | Status: AC
  Administered 2016-09-09: 40 mg via INTRAVENOUS
  Filled 2016-09-09: qty 4

## 2016-09-09 MED ORDER — DOXYCYCLINE HYCLATE 100 MG PO TABS
100.0000 mg | ORAL_TABLET | Freq: Two times a day (BID) | ORAL | Status: DC
Start: 2016-09-09 — End: 2016-09-10
  Administered 2016-09-09 – 2016-09-10 (×2): 100 mg via ORAL
  Filled 2016-09-09 (×2): qty 1

## 2016-09-09 MED ORDER — FUROSEMIDE 20 MG PO TABS
20.0000 mg | ORAL_TABLET | Freq: Every day | ORAL | Status: DC
  Administered 2016-09-10: 20 mg via ORAL
  Filled 2016-09-09: qty 1

## 2016-09-09 MED ORDER — POTASSIUM CHLORIDE CRYS ER 20 MEQ PO TBCR
40.0000 meq | EXTENDED_RELEASE_TABLET | ORAL | Status: AC
  Administered 2016-09-09 (×2): 40 meq via ORAL
  Filled 2016-09-09 (×2): qty 2

## 2016-09-09 NOTE — Progress Notes (Signed)
Pt arrived to the floor via bed with nurse and NT. Alert & Oriented x4. No  C/o pain or discomfort. IV in Right AC. Call bell place with in reach. Will continue to monitor.

## 2016-09-09 NOTE — Progress Notes (Addendum)
Advanced Heart Failure Rounding Note  PCP: Dr Yetta Flock  Primary Cardiologist: Dr Gala Romney   Subjective:    Admitted 09/06/16 with sepsis due to LLE cellulitis. WBC improved at 10 this am. Afebrile. Pain in LLE improved. Denies SOB, orthopnea. In good spirits today.   Objective:   Weight Range: 213 lb 13.5 oz (97 kg) Body mass index is 41.76 kg/m.   Vital Signs:   Temp:  [97.3 F (36.3 C)-98.3 F (36.8 C)] 98.3 F (36.8 C) (08/10 0400) Pulse Rate:  [61-90] 61 (08/10 0400) Resp:  [17-26] 18 (08/10 0400) BP: (126-150)/(69-91) 150/87 (08/10 0400) SpO2:  [95 %-98 %] 95 % (08/10 0400) Weight:  [200 lb (90.7 kg)-213 lb 13.5 oz (97 kg)] 213 lb 13.5 oz (97 kg) (08/10 0500) Last BM Date: 09/06/16  Weight change: Filed Weights   09/08/16 0600 09/08/16 2345 09/09/16 0500  Weight: 199 lb (90.3 kg) 200 lb (90.7 kg) 213 lb 13.5 oz (97 kg)    Intake/Output:   Intake/Output Summary (Last 24 hours) at 09/09/16 0851 Last data filed at 09/09/16 0601  Gross per 24 hour  Intake              220 ml  Output             2400 ml  Net            -2180 ml      Physical Exam    General: Elderly female, NAD.  HEENT: Normal.  Neck: Supple. JVP 9-10 cm. Carotids 2+ bilat; no bruits. No lymphadenopathy or thyromegaly appreciated. Cor: PMI nondisplaced.Irregular rate and rhythm. No rubs, gallops or murmurs. Lungs: Clear bilaterally. Normal effort. No wheeze.  Abdomen: Soft, non tender, non distended. No hepatosplenomegaly. No bruits or masses. Good bowel sounds. Extremities: No cyanosis, clubbing, rash. LLE erythema. 1+ pedal edema bilaterally. 1+ pretibial edema in LLE.  Neuro: Alert and oriented x 3.  cranial nerves grossly intact. moves all 4 extremities w/o difficulty. Affect pleasant   Telemetry   Afib, V pacing. - Personally reviewed.   EKG    09/07/16 - Afib 130 bpm - personally reviewed.   Labs    CBC  Recent Labs  09/07/16 0000  09/08/16 0437 09/09/16 0420  WBC 15.5*   < > 12.4* 10.0  NEUTROABS 11.7*  --  9.8*  --   HGB 17.4*  < > 13.8 14.0  HCT 52.5*  < > 42.0 41.2  MCV 97.0  < > 97.4 94.9  PLT 133*  < > 245 260  < > = values in this interval not displayed. Basic Metabolic Panel  Recent Labs  09/07/16 0226 09/08/16 0437 09/09/16 0420  NA 138 137 138  K 3.4* 3.7 2.9*  CL 109 108 105  CO2 21* 19* 21*  GLUCOSE 125* 131* 76  BUN 12 15 17   CREATININE 0.96 0.91 0.96  CALCIUM 8.1* 8.3* 8.3*  MG 1.9 1.9 1.8  PHOS 2.9  --   --    Liver Function Tests  Recent Labs  09/07/16 0226 09/08/16 0437  AST 20 17  ALT 10* 6*  ALKPHOS 69 74  BILITOT 1.2 0.8  PROT 5.6* 6.0*  ALBUMIN 2.5* 2.5*    BNP: BNP (last 3 results)  Recent Labs  07/13/16 1515  BNP 367.4*    Thyroid Function Tests  Recent Labs  09/07/16 0226  TSH 3.623    Other results:   Imaging   Left Lower Extremity Venous Duplex Evaluation 09/06/16 -  Technically difficult to image due to pain in the left lower leg. - No evidence of deep vein or superficial thrombosis involving the   left lower extremity and right common femoral vein. - No evidence of Baker&'s cyst on the left.    Medications:     Scheduled Medications: . amiodarone  200 mg Oral Daily  . carbidopa-levodopa  1 tablet Oral BID WC  . diltiazem  120 mg Oral q1800  . metoprolol tartrate  25 mg Oral BID  . pantoprazole  40 mg Oral Daily  . potassium chloride  40 mEq Oral Q4H  . rOPINIRole  0.5 mg Oral QHS  . senna  1 tablet Oral BID  . tiZANidine  2 mg Oral Daily  . Warfarin - Pharmacist Dosing Inpatient   Does not apply q1800    Infusions: . piperacillin-tazobactam (ZOSYN)  IV 3.375 g (09/09/16 0728)    PRN Medications: acetaminophen **OR** acetaminophen, HYDROcodone-acetaminophen, ondansetron **OR** ondansetron (ZOFRAN) IV, polyethylene glycol    Patient Profile   Ms George is a 83 year old with a history of diastolic heart failure, chronic a fib complicated by tachybrady syndrome, S/P  pacemaker 2010, HTN, GERD, CVA 2015 admitted with sepsis with LLE cellulitis.   Assessment/Plan   1. Sepsis: Source felt to LLE cellulitis. Afebrile, blood cultures no growth to date.  - Continue Vanc and Zosyn.  - If cultures come back + for staph, need to consider device extraction.   2. Acute on chronic diastolic CHF:  - Volume status improved on exam. Weights labile. Will give 40 mg IV lasix once today and restart po tomorrow.  - Continue metoprolol 25 mg BID.   3. Chronic atrial fibrillation - Rate controlled with Diltiazem. On Amio as well.  - Continue warfarin for anticoagulation .  4. History of CVA  - No residual  - Continue warfarin. Has an allergy to statins.  - No change  5. Hypokalemia - K 2.9. BID to be given and completed by noon today. Will recheck BMET at 14:00.   Length of Stay: 3   Little Ishikawa, NP  09/09/2016, 8:51 AM  Advanced Heart Failure Team Pager [phone] (M-F; 7a - 4p)  Please contact [facility] for night-coverage after hours (4p -7a )   Patient seen and examined with Suzzette Righter, NP. We discussed all aspects of the encounter. I agree with the assessment and plan as stated above.   Cellulitis and volume overload improving. Bcx negative so no need for TEE or device extraction at this point.  Will change lasix to home dose of 20mg  po daily. Can give extra if needed. Supp K+  We will see again Monday if still in house. Please call over the weekend with any questions.   Sunday, MD  10:50 PM

## 2016-09-09 NOTE — Care Management Important Message (Signed)
Important Message  Patient Details  Name: [name] MRN: [id number] Date of Birth: [birth year]   Medicare Important Message Given:  Yes    Khylon Davies 09/09/2016, 1:38 PM

## 2016-09-09 NOTE — Progress Notes (Signed)
PROGRESS NOTE  JU AGREDA NGE:[id number] DOB: 04/03/1923 DOA: 09/06/2016 PCP: Charlott Rakes, MD  HPI/Recap of past 24 hours:  Report left leg continue to improve, able to bear weight , she did well with physical therapy today Heart rate better , bp  elevated,   Report had diarrhea 5times , last one 4hrs ago, denies ab pain,  chronic tremor is better   Assessment/Plan: Active Problems:   Essential hypertension   Chronic atrial fibrillation (HCC)   Chronic kidney disease   Chronic diastolic heart failure (HCC)   Chest pain   Sepsis (HCC)   Cellulitis   Atrial fibrillation with RVR (HCC)   Tremor  Sepsis presented on admission  with left lower leg cellulitis ( circumferential erythema /edema involves more than 80% of her left lower leg, extending up to left knee and extending down to left foot),  leukocytosis ( wbc 23.8), lactic acidosis (lactic acid 3.04), tachycardia, heart rate  110's,  -Blood culture no growth on day one, mrsa screening negative, Left lower leg x ray no fracture, no mention of osteo, Left lower extremity doppler no DVT -she received ivf initially, now off ivf, she required stress dose steroids briefly on 8/8, bp has improved, d/c stress dose steroids on 8/9. -She is started on vanc/sozyn since admission, left leg has significantly improved, culture no growth, leukocytosis has resolved, d/c vanc/zosyn, change abx to doxycycline. Elevate left leg, though erythema has much improved, remain edematous on left leg, right leg no edema, no erythema.    AFib/RVR; s/p pace maker -I have personally reviewed ekg and tele daily, she remains in afib, heart rate is better controlled on ccb / betablocker/amiodarone -coumadin per pharmacy  --cardiology consulted. Input appreciated  Diastolic chf:  - home meds lasix held initially in the setting of sepsis -sepsis physiology is resolving, volume seems up a bit, diuretics per cardiology -cardiology input  appreciated  Significant hypokalemia:  k 2.9 this am, from lasix and diarrhea? Replace k, check mag  CKDIII, cr/gfr close to baseline, renal dosing meds  HTN,  She presented with borderline low  bp due to sepsis, required stress dose steroids breifly continue cardizem , home meds lopressor held initially on admission due to borderline bp, resumed at lower dose on 8/9  H/o RA on plaquenil/chronically immunosupressed status, held in the setting of sepsis, consider resumption prior to discharge.  Diarrhea, seem has resolved, no leukocytosis, no fever, no abdominal pain, she report h/o lactose intolerance,  Report diarrhea has resolved, d/c stool softener, if diarrhea reoccur will need to check for c diff.  Code Status: full  Family Communication: patient   Disposition Plan: transfer to med tele, anticipate discharge home with home health RN , need  cardiology clearance.   Consultants:  cardiology  Procedures:  none  Antibiotics:  Vanc since admission  To 8/9  zosyn from admission to 8/10  Doxycycline from 8/10   Objective: BP (!) 150/87 (BP Location: Right Arm)   Pulse 61   Temp 98.3 F (36.8 C) (Oral)   Resp 18   Ht 5' (1.524 m)   Wt 97 kg (213 lb 13.5 oz)   SpO2 95%   BMI 41.76 kg/m   Intake/Output Summary (Last 24 hours) at 09/09/16 0742 Last data filed at 09/09/16 0601  Gross per 24 hour  Intake              220 ml  Output  2400 ml  Net            -2180 ml   Filed Weights   09/08/16 0600 09/08/16 2345 09/09/16 0500  Weight: 90.3 kg (199 lb) 90.7 kg (200 lb) 97 kg (213 lb 13.5 oz)    Exam: Patient is examined daily including today on 09/09/2016, exam remain the same as of yesterday except that is changed   General:  Frail elderly, NAD  Cardiovascular: IRRR  Respiratory: CTABL  Abdomen: Soft/ND/NT, positive BS  Musculoskeletal:left lower extremity remain edematous, but improving, left lower extremity erythema has largely resolved     Neuro: chronic tremor, residual left lower extremity weakness from prior cva  Data Reviewed: Basic Metabolic Panel:  Recent Labs Lab 09/06/16 1810 09/06/16 2052 09/07/16 0000 09/07/16 0226 09/08/16 0437 09/09/16 0420  NA 136 139 138 138 137 138  K 3.8 3.8 3.9 3.4* 3.7 2.9*  CL 104 104 108 109 108 105  CO2 24  --  24 21* 19* 21*  GLUCOSE 99 101* 103* 125* 131* 76  BUN 13 15 13 12 15 17   CREATININE 1.18* 1.00 1.04* 0.96 0.91 0.96  CALCIUM 9.1  --  8.1* 8.1* 8.3* 8.3*  MG  --   --   --  1.9 1.9 1.8  PHOS  --   --   --  2.9  --   --    Liver Function Tests:  Recent Labs Lab 09/06/16 1810 09/07/16 0000 09/07/16 0226 09/08/16 0437  AST 26 20 20 17   ALT 8* 10* 10* 6*  ALKPHOS 83 64 69 74  BILITOT 1.4* 1.4* 1.2 0.8  PROT 6.5 5.5* 5.6* 6.0*  ALBUMIN 3.0* 2.5* 2.5* 2.5*   No results for input(s): LIPASE, AMYLASE in the last 168 hours. No results for input(s): AMMONIA in the last 168 hours. CBC:  Recent Labs Lab 09/06/16 1810 09/06/16 2052 09/07/16 0000 09/07/16 0226 09/08/16 0437 09/09/16 0420  WBC 23.8*  --  15.5* 17.8* 12.4* 10.0  NEUTROABS 17.1*  --  11.7*  --  9.8*  --   HGB 15.5* 15.0 17.4* 13.4 13.8 14.0  HCT 45.7 44.0 52.5* 41.2 42.0 41.2  MCV 96.2  --  97.0 96.3 97.4 94.9  PLT 266  --  133* 232 245 260   Cardiac Enzymes:   No results for input(s): CKTOTAL, CKMB, CKMBINDEX, TROPONINI in the last 168 hours. BNP (last 3 results)  Recent Labs  07/13/16 1515  BNP 367.4*    ProBNP (last 3 results) No results for input(s): PROBNP in the last 8760 hours.  CBG: No results for input(s): GLUCAP in the last 168 hours.  Recent Results (from the past 240 hour(s))  Blood Culture (routine x 2)     Status: None (Preliminary result)   Collection Time: 09/06/16  6:10 PM  Result Value Ref Range Status   Specimen Description BLOOD RIGHT ANTECUBITAL  Final   Special Requests   Final    BOTTLES DRAWN AEROBIC AND ANAEROBIC Blood Culture adequate volume    Culture NO GROWTH 2 DAYS  Final   Report Status PENDING  Incomplete  Blood Culture (routine x 2)     Status: None (Preliminary result)   Collection Time: 09/06/16  6:48 PM  Result Value Ref Range Status   Specimen Description BLOOD RIGHT WRIST  Final   Special Requests   Final    BOTTLES DRAWN AEROBIC AND ANAEROBIC Blood Culture adequate volume   Culture NO GROWTH 2 DAYS  Final  Report Status PENDING  Incomplete  MRSA PCR Screening     Status: None   Collection Time: 09/06/16 10:36 PM  Result Value Ref Range Status   MRSA by PCR NEGATIVE NEGATIVE Final    Comment:        The GeneXpert MRSA Assay (FDA approved for NASAL specimens only), is one component of a comprehensive MRSA colonization surveillance program. It is not intended to diagnose MRSA infection nor to guide or monitor treatment for MRSA infections.      Studies: No results found.  Scheduled Meds: . amiodarone  200 mg Oral Daily  . carbidopa-levodopa  1 tablet Oral BID WC  . diltiazem  120 mg Oral q1800  . metoprolol tartrate  25 mg Oral BID  . pantoprazole  40 mg Oral Daily  . potassium chloride  40 mEq Oral Q4H  . rOPINIRole  0.5 mg Oral QHS  . senna  1 tablet Oral BID  . tiZANidine  2 mg Oral Daily  . Warfarin - Pharmacist Dosing Inpatient   Does not apply q1800    Continuous Infusions: . piperacillin-tazobactam (ZOSYN)  IV 3.375 g (09/09/16 0728)     Time spent: 35 mins  I have personally reviewed and interpreted daily labs, tele strips,  as discussed above under date review session and assessment and plans.    Starlee Corralejo MD, PhD  [facility] Pager [phone]. If 7PM-7AM, please contact night-coverage at [email or web address], password [email or web address] 09/09/2016, 7:42 AM  LOS: 3 days

## 2016-09-09 NOTE — Progress Notes (Signed)
ANTICOAGULATION CONSULT NOTE - Follow-Up  Pharmacy Consult for Warfarin  Indication: atrial fibrillation  Patient Measurements: Height: 5' (152.4 cm) Weight: 213 lb 13.5 oz (97 kg) IBW/kg (Calculated) : 45.5  Vital Signs: Temp: 98.3 F (36.8 C) (08/10 0922) Temp Source: Oral (08/10 0922) BP: 156/98 (08/10 0922) Pulse Rate: 61 (08/10 0400)  Labs:  Recent Labs  09/07/16 0226 09/08/16 0437 09/09/16 0420  HGB 13.4 13.8 14.0  HCT 41.2 42.0 41.2  PLT 232 245 260  LABPROT 24.5* 26.9* 32.8*  INR 2.17 2.43 3.12  CREATININE 0.96 0.91 0.96    Estimated Creatinine Clearance: 39.8 mL/min (by C-G formula based on SCr of 0.96 mg/dL).   Assessment: 83 y.o. female admitted with cellulitis. Patient with history of afib on chronic warfarin . Pharmacy has been consulted to continue warfarin.  INR today remains therapeutic (INR3.12<< 2.43 << 2.17, goal of 2-3). CBC wnl, no signs/symptoms of bleeding are noted at this time.  Patient is on amiodarone and ropinirole as prior to admission which can raise warfarin levels.    PTA dose: 3 mg every day except 5 mg on Monday and Thursday . Last dose: 8/7.   Goal of Therapy:  INR 2-3 Monitor platelets by anticoagulation protocol: Yes   Plan:  No warfarin today Daily INR  Thank you Okey Regal, PharmD [phone] 09/09/2016 10:25 AM

## 2016-09-10 LAB — BASIC METABOLIC PANEL
Anion gap: 11 (ref 5–15)
BUN: 19 mg/dL (ref 6–20)
CALCIUM: 8.3 mg/dL — AB (ref 8.9–10.3)
CO2: 22 mmol/L (ref 22–32)
Chloride: 106 mmol/L (ref 101–111)
Creatinine, Ser: 1.08 mg/dL — ABNORMAL HIGH (ref 0.44–1.00)
GFR, EST AFRICAN AMERICAN: 50 mL/min — AB (ref >60)
GFR, EST NON AFRICAN AMERICAN: 44 mL/min — AB (ref >60)
Glucose, Bld: 82 mg/dL (ref 65–99)
Potassium: 3.7 mmol/L (ref 3.5–5.1)
SODIUM: 139 mmol/L (ref 135–145)

## 2016-09-10 LAB — MAGNESIUM: MAGNESIUM: 1.9 mg/dL (ref 1.7–2.4)

## 2016-09-10 LAB — PROTIME-INR
INR: 3.05
PROTHROMBIN TIME: 32.2 s — AB (ref 11.4–15.2)

## 2016-09-10 MED ORDER — DOXYCYCLINE HYCLATE 100 MG PO TABS: 100.0000 mg | ORAL_TABLET | Freq: Two times a day (BID) | ORAL | 0 refills | Status: AC

## 2016-09-10 MED ORDER — WARFARIN 0.5 MG HALF TABLET
0.5000 mg | ORAL_TABLET | Freq: Once | ORAL | Status: DC
  Filled 2016-09-10: qty 1

## 2016-09-10 MED ORDER — WARFARIN SODIUM 3 MG PO TABS: 3.0000 mg | ORAL_TABLET | Freq: Every day | ORAL | 0 refills | Status: DC

## 2016-09-10 NOTE — Care Management Note (Signed)
Case Management Note  Patient Details  Name: [name] MRN: [id number] Date of Birth: [birth year]  Subjective/Objective:                 Spoke w patient at the bedside, she would like to use [facility] at home for [facility]. Referral called for [facility] RN OT. Witnessed pt ambulate to BR w minimal use of RW and  No difficulties, no need for 3/1 at time of discharge.    Action/Plan:  Will DCto home w [facility] today Expected Discharge Date:                  Expected Discharge Plan:  Home w Home Health Services  In-House Referral:     Discharge planning Services  CM Consult  Post Acute Care Choice:    Choice offered to:     DME Arranged:    DME Agency:     HH Arranged:  RN, OT HH Agency:  [facility]  Status of Service:  Completed, signed off  If discussed at Long Length of Stay Meetings, dates discussed:    Additional Comments:  Lawerance Sabal, RN 09/10/2016, 10:06 AM

## 2016-09-10 NOTE — Progress Notes (Signed)
ANTICOAGULATION CONSULT NOTE - Follow-Up  Pharmacy Consult for Warfarin  Indication: atrial fibrillation  Patient Measurements: Height: 5' (152.4 cm) Weight: 183 lb (83 kg) IBW/kg (Calculated) : 45.5  Vital Signs: Temp: 98.1 F (36.7 C) (08/11 0914) Temp Source: Oral (08/11 0914) BP: 134/82 (08/11 0914) Pulse Rate: 88 (08/11 0914)  Labs:  Recent Labs  09/08/16 0437 09/09/16 0420 09/09/16 1454 09/10/16 0356  HGB 13.8 14.0  --   --   HCT 42.0 41.2  --   --   PLT 245 260  --   --   LABPROT 26.9* 32.8*  --  32.2*  INR 2.43 3.12  --  3.05  CREATININE 0.91 0.96 1.19* 1.08*    Estimated Creatinine Clearance: 32.4 mL/min (A) (by C-G formula based on SCr of 1.08 mg/dL (H)).   Assessment: 83 y.o. female admitted with cellulitis. Patient with history of afib on chronic warfarin .  INR today = 3.05, slight above goal, dose held 8/10  Patient is on amiodarone and ropinirole as prior to admission which can raise warfarin levels.    PTA dose: 3 mg every day except 5 mg on Monday and Thursday .    Goal of Therapy:  INR 2-3 Monitor platelets by anticoagulation protocol: Yes   Plan:  Warfarin 0.5 mg po x 1 today Daily INR  Thank you Okey Regal, PharmD [phone] 09/10/2016 11:11 AM

## 2016-09-10 NOTE — Discharge Summary (Signed)
Discharge Summary  [name] RFV:[id number] DOB: 24-Mar-1923  PCP: Charlott Rakes, MD  Admit date: 09/06/2016 Discharge date: 09/10/2016  Time spent: >8mins, more than 50% time spent of coordination of care.  Recommendations for Outpatient Follow-up:  1. F/u with PMD on monday  for hospital discharge follow up, repeat cbc/bmp at follow up, check INR at pmd office on Monday, then every three days while on abx treatment, further coumadin adjustment per pmd instruction 2. F/u with cardiology 3. Home health arranged  Discharge Diagnoses:  Active Hospital Problems   Diagnosis Date Noted  . Sepsis (HCC) 09/06/2016  . Cellulitis 09/06/2016  . Atrial fibrillation with RVR (HCC) 09/06/2016  . Tremor 09/06/2016  . Chest pain 03/21/2014  . Chronic diastolic heart failure (HCC) 10/18/2011  . Chronic kidney disease 09/06/2010  . Essential hypertension 06/04/2008  . Chronic atrial fibrillation (HCC) 06/04/2008    Resolved Hospital Problems   Diagnosis Date Noted Date Resolved  No resolved problems to display.    Discharge Condition: stable  Diet recommendation: heart healthy  Filed Weights   09/08/16 2345 09/09/16 0500 09/10/16 0506  Weight: 90.7 kg (200 lb) 97 kg (213 lb 13.5 oz) 83 kg (183 lb)    History of present illness:  PCP: Charlott Rakes, MD   Outpatient Specialists: [facility]  Patient coming from:    home Lives  With family grand daughter    Chief Complaint: Leg pain and swelling  HPI: [name] is a 83 y.o. female with medical history significant of a.fib on chronic anticoagulation with Coumadin, chronic diastolic CHF, tachybrady syndrome s/p pacemaker placement in 2010, HTN and GERD, CTD, history of CVA, HLD    Presented with left leg redness swelling overall feeling bad for the past 2 days associated some nausea. States at home her heart rates have been going up to 135 ago she started Cardizem last week. She may have had mild chest pain  this morning but now went away. although she occasionally still short of breath. She does have chronic CHF. Denies any fevers or chills. But yesterday she may have had some subjective fevers because she felt hot. She has been nauseous unable to tolerate her PO meds.   Regarding pertinent Chronic problems: Cardiac catheterization 2012 minimal coronary artery disease Last echogram in July 2017 preserved EF bilateral atrial enlargement and able to assess for diastolic dysfunction Fibrillations on amiodarone  IN ER:  Temp (24hrs), Avg:98.8 F (37.1 C), Min:98.8 F (37.1 C), Max:98.8 F (37.1 C)      on arrival      ED Triage Vitals  Enc Vitals Group     BP 09/06/16 1730 113/72     Pulse Rate 09/06/16 1730 (!) 110     Resp 09/06/16 1730 19     Temp 09/06/16 1730 98.8 F (37.1 C)     Temp Source 09/06/16 1730 Oral     SpO2 09/06/16 1716 96 %     Weight 09/06/16 1730 187 lb (84.8 kg)     Height 09/06/16 1730 5' (1.524 m)     Head Circumference --      Peak Flow --      Pain Score 09/06/16 1720 8     Pain Loc --      Pain Edu? --      Excl. in GC? --   Meeting  sepsis criteria -  increase respirations 32 tachycardic up to 119 systolic blood pressure currently 134/82 lactic acid 3.04  Sodium 1:30  6K3.8 BUN 13 creatinine 1.18 3.0 CBC 23.8 hemoglobin 15.5 INR 2.36 Chest x-ray stable cardiomegaly and mild interstitial edema trace bilateral pleural effusions Following Medications were ordered in ER: Medications  0.9 %  sodium chloride infusion ( Intravenous New Bag/Given 09/06/16 1823)  vancomycin (VANCOCIN) IVPB 750 mg/150 ml premix (not administered)  piperacillin-tazobactam (ZOSYN) IVPB 3.375 g (not administered)  sodium chloride 0.9 % bolus 1,000 mL (not administered)  piperacillin-tazobactam (ZOSYN) IVPB 3.375 g (3.375 g Intravenous New Bag/Given 09/06/16 1821)  vancomycin (VANCOCIN) 1,500 mg in sodium chloride 0.9 % 500 mL IVPB (1,500 mg Intravenous New Bag/Given 09/06/16 1202)      Hospitalist was called for admission for Sepsis secondary to cellulitis with A.fib w RVR   Hospital Course:  Active Problems:   Essential hypertension   Chronic atrial fibrillation (HCC)   Chronic kidney disease   Chronic diastolic heart failure (HCC)   Chest pain   Sepsis (HCC)   Cellulitis   Atrial fibrillation with RVR (HCC)   Tremor   Sepsis presented on admission  with left lower leg cellulitis ( circumferential erythema /edema involves more than 80% of her left lower leg, extending up to left knee and extending down to left foot),  leukocytosis ( wbc 23.8), lactic acidosis (lactic acid 3.04), tachycardia, heart rate  110's,  -Blood culture no growth on day one, mrsa screening negative, Left lower leg x ray no fracture, no mention of osteo, Left lower extremity doppler no DVT -she received ivf initially, now off ivf. -she required stress dose steroids briefly on 8/8 due to hypotension. bp has improved, stress dose steroids discontinued on 8/9. -She is started on vanc/sozyn since admission. left leg has significantly improved, culture no growth, leukocytosis has resolved, d/c vanc/zosyn, change abx to doxycycline. Elevate left leg, though erythema has much improved, remain edematous on left leg, right leg no edema, no erythema. -she is discharged home on doxycycline to finish total of 10day treatment course.    AFib/RVR; s/p pace maker -I have personally reviewed ekg and tele daily, she remains in afib, heart rate is better controlled on ccb / betablocker/amiodarone -coumadin per pharmacy while in the hospital --cardiology consulted. Input appreciated -she is discharged on couamdin 3mg  daily, she is to have INR checked on Monday , then every three days while she is on abx.  Diastolic chf:  - home meds lasix held initially in the setting of sepsis -sepsis physiology resolved, volume seems up a bit, diuretics restarted per cardiology -euvolemic at discharge,  cardiology input appreciated She is to follow up with cardiology on outpatient basis.  Significant hypokalemia:  k 2.9 this am, from lasix and diarrhea? Replace k,  Mag 1.9 k 3.7 on discharge day.  CKDIII, cr/gfr close to baseline, renal dosing meds  HTN,  She presented with borderline low  bp due to sepsis, required stress dose steroids breifly continue cardizem , home meds lopressor held initially on admission due to borderline bp, resumed at lower dose on 8/9 bp  Stable on cardizem/lopressor/lasix  H/o RA on plaquenil/chronically immunosupressed status, plaquenil  held in the setting of sepsis, resumed at  discharge.  Diarrhea,   resolved, no leukocytosis, no fever, no abdominal pain, she reports h/o lactose intolerance,    Code Status: full  Family Communication: patient   Disposition Plan: discharge home with home health RN , with cardiology clearance.   Consultants:  cardiology  Procedures:  none  Antibiotics:  Vanc since admission  To 8/9  zosyn from admission  to 8/10  Doxycycline from 8/10    Discharge Exam: BP 134/82 (BP Location: Right Arm)   Pulse 88   Temp 98.1 F (36.7 C) (Oral)   Resp 16   Ht 5' (1.524 m)   Wt 83 kg (183 lb)   SpO2 94%   BMI 35.74 kg/m    General:  Frail elderly, NAD, aaox3  Cardiovascular: IRRR  Respiratory: CTABL  Abdomen: Soft/ND/NT, positive BS  Musculoskeletal: left lower extremity edema is improving, left lower extremity erythema resolved    Neuro: chronic tremor, residual mild left lower extremity weakness from prior cva   Discharge Instructions You were cared for by a hospitalist during your hospital stay. If you have any questions about your discharge medications or the care you received while you were in the hospital after you are discharged, you can call the unit and asked to speak with the hospitalist on call if the hospitalist that took care of you is not available. Once you are  discharged, your primary care physician will handle any further medical issues. Please note that NO REFILLS for any discharge medications will be authorized once you are discharged, as it is imperative that you return to your primary care physician (or establish a relationship with a primary care physician if you do not have one) for your aftercare needs so that they can reassess your need for medications and monitor your lab values.  Discharge Instructions    DME Bedside commode    Complete by:  As directed    Patient needs a bedside commode to treat with the following condition:  Weakness   Diet - low sodium heart healthy    Complete by:  As directed    Face-to-face encounter (required for Medicare/Medicaid patients)    Complete by:  As directed    I Casyn Becvar certify that this patient is under my care and that I, or a nurse practitioner or physician's assistant working with me, had a face-to-face encounter that meets the physician face-to-face encounter requirements with this patient on 09/10/2016. The encounter with the patient was in whole, or in part for the following medical condition(s) which is the primary reason for home health care (List medical condition): FTT, RN for medication supervision and heart failure management.   The encounter with the patient was in whole, or in part, for the following medical condition, which is the primary reason for home health care:  FTT   I certify that, based on my findings, the following services are medically necessary home health services:  Nursing   Reason for Medically Necessary Home Health Services:  Skilled Nursing- Change/Decline in Patient Status   My clinical findings support the need for the above services:  Shortness of breath with activity   Further, I certify that my clinical findings support that this patient is homebound due to:  Shortness of Breath with activity   Home Health    Complete by:  As directed    To provide the following  care/treatments:   RN OT     Increase activity slowly    Complete by:  As directed      Allergies as of 09/10/2016      Reactions   Lipitor [atorvastatin] Other (See Comments)   Myalgias (noted in neurologist note on 08/23/2013 when admitted for acute CVA).  08/20/15:  Pravastatin started at that time. However, patient is not taking Pravastatin/told okay to stop it by PCP. [facility] said it was last filled in 07/2013)  Morphine And Related Other (See Comments)   hallucinations      Medication List    TAKE these medications   amiodarone 200 MG tablet Commonly known as:  PACERONE Take 1 tablet (200 mg total) by mouth daily.   carbidopa-levodopa 50-200 MG tablet Commonly known as:  SINEMET CR Take 1 tablet by mouth 2 (two) times daily.   diltiazem 120 MG tablet Commonly known as:  CARDIZEM Take 1 tablet (120 mg total) by mouth daily.   doxycycline 100 MG tablet Commonly known as:  VIBRA-TABS Take 1 tablet (100 mg total) by mouth every 12 (twelve) hours.   furosemide 20 MG tablet Commonly known as:  LASIX Take 20 mg by mouth daily.   hydroxychloroquine 200 MG tablet Commonly known as:  PLAQUENIL Take 200 mg by mouth 2 (two) times daily.   metoprolol tartrate 25 MG tablet Commonly known as:  LOPRESSOR Take 25 mg by mouth daily. What changed:  Another medication with the same name was removed. Continue taking this medication, and follow the directions you see here.   omeprazole 20 MG capsule Commonly known as:  PRILOSEC Take 20 mg by mouth daily.   rOPINIRole 0.5 MG tablet Commonly known as:  REQUIP Take 0.5 mg by mouth at bedtime.   tiZANidine 2 MG tablet Commonly known as:  ZANAFLEX Take 2 mg by mouth daily.   TUMS 500 MG chewable tablet Generic drug:  calcium carbonate Chew 1 tablet by mouth daily as needed for indigestion or heartburn.   warfarin 3 MG tablet Commonly known as:  COUMADIN Take 1 tablet (3 mg total) by mouth daily. What  changed:  how much to take  when to take this  additional instructions            Durable Medical Equipment        Start     Ordered   09/09/16 0000  DME Bedside commode    Question:  Patient needs a bedside commode to treat with the following condition  Answer:  Weakness   09/09/16 1051     Allergies  Allergen Reactions  . Lipitor [Atorvastatin] Other (See Comments)    Myalgias (noted in neurologist note on 08/23/2013 when admitted for acute CVA).  08/20/15:  Pravastatin started at that time. However, patient is not taking Pravastatin/told okay to stop it by PCP. [facility] said it was last filled in 07/2013)   . Morphine And Related Other (See Comments)    hallucinations   Follow-up Information    Charlott Rakes, MD Follow up.   Specialty:  Family Medicine Why:  hospital discharge follow up, please have your coumadin level checked every three days from monday. further coumadin dose adjustment per your primary care doctor. Contact information: [address] [place] [address] [phone]        [facility], [facility] Follow up.   Specialty:  Home Health Services Why:  For [facility] OT RN Contact information: [address] [place] [address] [phone]        follow up with cardiology Follow up.            The results of significant diagnostics from this hospitalization (including imaging, microbiology, ancillary and laboratory) are listed below for reference.    Significant Diagnostic Studies: Dg Tibia/fibula Left  Result Date: 09/06/2016 CLINICAL DATA:  Acute onset of left lower leg pain, erythema and swelling. Initial encounter. EXAM: LEFT TIBIA AND FIBULA - 2 VIEW COMPARISON:  Left knee radiographs performed 07/06/2014 FINDINGS: There is no evidence of fracture or dislocation. The left tibia and fibula appear intact. Mild diffuse joint space narrowing is noted at the knee, with prominent subcortical cysts at the  medial tibial plateau. No significant knee joint effusion is identified. Medial soft tissue swelling is noted about the lower leg. The ankle mortise appears grossly intact. IMPRESSION: 1. No evidence of fracture or dislocation. 2. Tricompartmental osteoarthritis at the left knee, most prominent at the medial compartment. Electronically Signed   By: Roanna Raider M.D.   On: 09/06/2016 21:33   Dg Chest Port 1 View  Result Date: 09/06/2016 CLINICAL DATA:  Cellulitis, left-sided sharp chest pain yesterday. Chronic dyspnea. EXAM: PORTABLE CHEST 1 VIEW COMPARISON:  None. FINDINGS: A large hiatal hernia projects over the cardiac silhouette. There is aortic atherosclerosis at the arch with uncoiling of the thoracic aorta. The heart is enlarged but stable. There is mild interstitial edema. Probable tiny pleural effusions bilaterally. Patient is rotated on current exam. Left-sided pacemaker apparatus projects over the periphery of the left hemithorax with right atrial and right ventricular leads identified. No acute nor suspicious osseous abnormality. Degenerative changes are seen along dorsal spine. IMPRESSION: 1. Stable cardiomegaly and aortic atherosclerosis. 2. Mild interstitial edema with trace bilateral pleural effusions. 3. Large hiatal hernia. Electronically Signed   By: Tollie Eth M.D.   On: 09/06/2016 18:35    Microbiology: Recent Results (from the past 240 hour(s))  Blood Culture (routine x 2)     Status: None (Preliminary result)   Collection Time: 09/06/16  6:10 PM  Result Value Ref Range Status   Specimen Description BLOOD RIGHT ANTECUBITAL  Final   Special Requests   Final    BOTTLES DRAWN AEROBIC AND ANAEROBIC Blood Culture adequate volume   Culture NO GROWTH 4 DAYS  Final   Report Status PENDING  Incomplete  Blood Culture (routine x 2)     Status: None (Preliminary result)   Collection Time: 09/06/16  6:48 PM  Result Value Ref Range Status   Specimen Description BLOOD RIGHT WRIST  Final    Special Requests   Final    BOTTLES DRAWN AEROBIC AND ANAEROBIC Blood Culture adequate volume   Culture NO GROWTH 4 DAYS  Final   Report Status PENDING  Incomplete  MRSA PCR Screening     Status: None   Collection Time: 09/06/16 10:36 PM  Result Value Ref Range Status   MRSA by PCR NEGATIVE NEGATIVE Final    Comment:        The GeneXpert MRSA Assay (FDA approved for NASAL specimens only), is one component of a comprehensive MRSA colonization surveillance program. It is not intended to diagnose MRSA infection nor to guide or monitor treatment for MRSA infections.      Labs: Basic Metabolic Panel:  Recent Labs Lab 09/07/16 0226 09/08/16 0437 09/09/16 0420 09/09/16 1454 09/10/16 0356  NA 138 137 138 138 139  K 3.4* 3.7 2.9* 3.6 3.7  CL 109 108 105 107 106  CO2 21* 19* 21* 20* 22  GLUCOSE 125* 131* 76 96 82  BUN 12 15 17 18 19   CREATININE 0.96 0.91 0.96 1.19* 1.08*  CALCIUM 8.1* 8.3* 8.3* 8.4* 8.3*  MG 1.9 1.9 1.8  --  1.9  PHOS 2.9  --   --   --   --    Liver Function Tests:  Recent Labs Lab 09/06/16 1810 09/07/16 0000 09/07/16 0226 09/08/16 0437  AST 26 20 20  17  ALT 8* 10* 10* 6*  ALKPHOS 83 64 69 74  BILITOT 1.4* 1.4* 1.2 0.8  PROT 6.5 5.5* 5.6* 6.0*  ALBUMIN 3.0* 2.5* 2.5* 2.5*   No results for input(s): LIPASE, AMYLASE in the last 168 hours. No results for input(s): AMMONIA in the last 168 hours. CBC:  Recent Labs Lab 09/06/16 1810 09/06/16 2052 09/07/16 0000 09/07/16 0226 09/08/16 0437 09/09/16 0420  WBC 23.8*  --  15.5* 17.8* 12.4* 10.0  NEUTROABS 17.1*  --  11.7*  --  9.8*  --   HGB 15.5* 15.0 17.4* 13.4 13.8 14.0  HCT 45.7 44.0 52.5* 41.2 42.0 41.2  MCV 96.2  --  97.0 96.3 97.4 94.9  PLT 266  --  133* 232 245 260   Cardiac Enzymes: No results for input(s): CKTOTAL, CKMB, CKMBINDEX, TROPONINI in the last 168 hours. BNP: BNP (last 3 results)  Recent Labs  07/13/16 1515  BNP 367.4*    ProBNP (last 3 results) No results for  input(s): PROBNP in the last 8760 hours.  CBG: No results for input(s): GLUCAP in the last 168 hours.     SignedAlbertine Grates MD, PhD  [facility] 09/10/2016, 12:08 PM

## 2016-09-11 LAB — CULTURE, BLOOD (ROUTINE X 2)
CULTURE: NO GROWTH
Culture: NO GROWTH
SPECIAL REQUESTS: ADEQUATE
Special Requests: ADEQUATE

## 2016-09-12 ENCOUNTER — Encounter (HOSPITAL_COMMUNITY): Payer: Self-pay

## 2016-09-12 ENCOUNTER — Ambulatory Visit (HOSPITAL_COMMUNITY)
Admission: RE | Admit: 2016-09-12 | Discharge: 2016-09-12 | Disposition: A | Discharge status: Home / Self Care | Payer: Medicare HMO | Source: Ambulatory Visit | Attending: Internal Medicine | Admitting: Internal Medicine

## 2016-09-12 VITALS — BP 128/80 | HR 70 | Wt 188.2 lb

## 2016-09-12 DIAGNOSIS — I482 Chronic atrial fibrillation, unspecified: Secondary | ICD-10-CM

## 2016-09-12 DIAGNOSIS — I2510 Atherosclerotic heart disease of native coronary artery without angina pectoris: Secondary | ICD-10-CM | POA: Diagnosis not present

## 2016-09-12 DIAGNOSIS — Z8673 Personal history of transient ischemic attack (TIA), and cerebral infarction without residual deficits: Secondary | ICD-10-CM | POA: Insufficient documentation

## 2016-09-12 DIAGNOSIS — Z79899 Other long term (current) drug therapy: Secondary | ICD-10-CM | POA: Insufficient documentation

## 2016-09-12 DIAGNOSIS — I110 Hypertensive heart disease with heart failure: Secondary | ICD-10-CM | POA: Diagnosis not present

## 2016-09-12 DIAGNOSIS — K219 Gastro-esophageal reflux disease without esophagitis: Secondary | ICD-10-CM | POA: Insufficient documentation

## 2016-09-12 DIAGNOSIS — I5032 Chronic diastolic (congestive) heart failure: Secondary | ICD-10-CM | POA: Diagnosis not present

## 2016-09-12 DIAGNOSIS — I10 Essential (primary) hypertension: Secondary | ICD-10-CM | POA: Diagnosis not present

## 2016-09-12 DIAGNOSIS — E785 Hyperlipidemia, unspecified: Secondary | ICD-10-CM | POA: Diagnosis not present

## 2016-09-12 DIAGNOSIS — Z950 Presence of cardiac pacemaker: Secondary | ICD-10-CM

## 2016-09-12 DIAGNOSIS — Z7901 Long term (current) use of anticoagulants: Secondary | ICD-10-CM | POA: Diagnosis not present

## 2016-09-12 DIAGNOSIS — L03116 Cellulitis of left lower limb: Secondary | ICD-10-CM | POA: Diagnosis not present

## 2016-09-12 DIAGNOSIS — G250 Essential tremor: Secondary | ICD-10-CM | POA: Diagnosis not present

## 2016-09-12 MED ORDER — FUROSEMIDE 20 MG PO TABS: 20.0000 mg | ORAL_TABLET | Freq: Every day | ORAL | 6 refills | Status: DC

## 2016-09-12 NOTE — Patient Instructions (Signed)
INCREASE Lasix to 40 mg twice daily for TWO DAYS, then resume to normal daily dose.  Follow up 2 months with Dr. Gala Romney.  Take all medication as prescribed the day of your appointment. Bring all medications with you to your appointment.  Do the following things EVERYDAY: 1) Weigh yourself in the morning before breakfast. Write it down and keep it in a log. 2) Take your medicines as prescribed 3) Eat low salt foods-Limit salt (sodium) to 2000 mg per day.  4) Stay as active as you can everyday 5) Limit all fluids for the day to less than 2 liters

## 2016-09-12 NOTE — Progress Notes (Signed)
Advanced Heart Failure Clinic Note   Patient ID: [name], female   DOB: Jun 19, 1923, 83 y.o.   MRN: [id number]  HPI: [name] is a delightful 83 year old woman with a history of diastolic HF, chronic atrial fibrillation c/b tachybrady syndrome s/p pacemaker placement in 2010, chronic dyspnea as well as hypertension and gastroesophageal reflux disease.  09/2010   L [place] normal LAD mild narrowing in the proximal and midvessel. The first diagonal branch has a 40% ostial stenosis. The left circumflex coronary artery has mild irregularities up to 10- 20%. The right coronary artery is a dominant vessel. It has mild wall irregularities less than or equal to 10%.   Has chronic AF, she saw Dr. Graciela Husbands in past and he noted that nearly 40% of her rates were > 110. Suggested amiodarone vs AV node ablation. We went with amiodarone and she felt much better.   Admitted 09/06/16-09/10/16 with LLE cellulitis. Was given fluid resusitation, required IV lasix. Discharge weight was 183 pounds.  She returns today for HF follow up. She is SOB with walking back to clinic, but this is no different that how she usually feels. She is still having some pain in her left leg, but went 24 hours without doxycycline due to her pharmacy being closed. Weights at home are stable, she denies orthopnea, PND and chest pain.    ECHO 07/2009: EF 55-60%, mild MR, LA mod dilated, RA mild dilated ECHO 10/14: EF 55-60%, biatrial enlargement, normal RV size/systolic function, PA systolic pressure 39 mmHg.  ECHO 7/17:  EF 55-60% RVSP 39 biatrial enlargement.   SH: Lives in [place] with her husband, nonsmoker.   Review of systems complete and found to be negative unless listed in HPI.     Past Medical History:  Diagnosis Date  . Anticoagulation goal of INR 2 to 3 08/21/2015  . Arthritis    oa  . Balance problem   . Chronic diastolic CHF (congestive heart failure) (HCC)   . Dyspnea   . GERD (gastroesophageal reflux disease)   .  Hyperlipidemia   . Hypertension   . Hypotensive episode 08/21/2015  . Mild CAD    a. nonobst CAD by cath 09/2010.  . Obesity   . Osteoporosis   . Pacemaker-[employer]. Jude   . Permanent atrial fibrillation (HCC)    a. c/b tachy-brady syndrome s/p PPM 2010.  [name] Stroke [facility]) 07/2013   Current Outpatient Prescriptions  Medication Sig Dispense Refill  . amiodarone (PACERONE) 200 MG tablet Take 1 tablet (200 mg total) by mouth daily. 30 tablet 3  . calcium carbonate (TUMS) 500 MG chewable tablet Chew 1 tablet by mouth daily as needed for indigestion or heartburn.    . carbidopa-levodopa (SINEMET CR) 50-200 MG tablet Take 1 tablet by mouth 2 (two) times daily.    [name] diltiazem (CARDIZEM) 120 MG tablet Take 1 tablet (120 mg total) by mouth daily. 30 tablet 6  . doxycycline (VIBRA-TABS) 100 MG tablet Take 1 tablet (100 mg total) by mouth every 12 (twelve) hours. 10 tablet 0  . furosemide (LASIX) 20 MG tablet Take 1 tablet (20 mg total) by mouth daily. 30 tablet 6  . hydroxychloroquine (PLAQUENIL) 200 MG tablet Take 200 mg by mouth 2 (two) times daily.    . metoprolol tartrate (LOPRESSOR) 50 MG tablet Take 25 mg by mouth 2 (two) times daily.     [name] omeprazole (PRILOSEC) 20 MG capsule Take 20 mg by mouth daily.      [name] rOPINIRole (REQUIP)  0.5 MG tablet Take 0.5 mg by mouth at bedtime.    [name] tiZANidine (ZANAFLEX) 2 MG tablet Take 2 mg by mouth daily.    [name] warfarin (COUMADIN) 3 MG tablet Take 1 tablet (3 mg total) by mouth daily. 30 tablet 0   No current facility-administered medications for this encounter.    Vitals:   09/12/16 1346  BP: 128/80  Pulse: 70  SpO2: 94%  Weight: 188 lb 3.2 oz (85.4 kg)    Wt Readings from Last 3 Encounters:  09/12/16 188 lb 3.2 oz (85.4 kg)  09/10/16 183 lb (83 kg)  08/08/16 187 lb 9.6 oz (85.1 kg)    PHYSICAL EXAM: General: Elderly female, well appearing. No resp difficulty. HEENT: Normal Neck: Supple. JVP 8-9 cm. Carotids 2+ bilat; no bruits. No thyromegaly or  nodule noted. Cor: PMI nondisplaced. RRR, No M/G/R noted Lungs: CTAB, normal effort. Abdomen: Soft, non-tender, non-distended, no HSM. No bruits or masses. +BS  Extremities: No cyanosis, clubbing, rash, LLE with erythema, 1+ pedal edema bilaterally.  Neuro: Alert & orientedx3, cranial nerves grossly intact. moves all 4 extremities w/o difficulty. Affect pleasant    ASSESSMENT & PLAN:  1) Chronic Diastolic HF: Echo July 2017 with EF 55-60% - NYHA III - Volume status slightly elevated on exam. Will have her increase her lasix to 40 mg BID for today and tomorrow.  - Continue metoprolol 25 mg BID.  - Encouraged her to continue to watch her sodium intake, drink less than 2L a day.   2) Atrial fibrillation with AVR on chronic Afib.   - Rate controlled  - Continue Cardizem and Amio  - She will make an appt. With Dr. Graciela Husbands.   3) Essential tremor - Stable, improved with reduced Amio.   4) HTN -Well controlled on current regimen.    5) CVA S/P 2015 - Continue Warfarin  - No ASA with warfarin.   6) LLE cellulitis - Improving, but still painful - Continue doxycycline. Told her to contact her PCP right away if symptoms do not improve in the next 24-48 hours.   Follow up with Dr. Gala Romney in 2 months.   Little Ishikawa, NP  2:08 PM

## 2016-09-13 DIAGNOSIS — I5032 Chronic diastolic (congestive) heart failure: Secondary | ICD-10-CM | POA: Diagnosis not present

## 2016-09-13 DIAGNOSIS — N183 Chronic kidney disease, stage 3 (moderate): Secondary | ICD-10-CM | POA: Diagnosis not present

## 2016-09-13 DIAGNOSIS — L03116 Cellulitis of left lower limb: Secondary | ICD-10-CM | POA: Diagnosis not present

## 2016-09-13 DIAGNOSIS — I130 Hypertensive heart and chronic kidney disease with heart failure and stage 1 through stage 4 chronic kidney disease, or unspecified chronic kidney disease: Secondary | ICD-10-CM | POA: Diagnosis not present

## 2016-09-13 DIAGNOSIS — I69344 Monoplegia of lower limb following cerebral infarction affecting left non-dominant side: Secondary | ICD-10-CM | POA: Diagnosis not present

## 2016-09-13 DIAGNOSIS — I480 Paroxysmal atrial fibrillation: Secondary | ICD-10-CM | POA: Diagnosis not present

## 2016-09-15 ENCOUNTER — Other Ambulatory Visit: Payer: Self-pay | Admitting: Internal Medicine

## 2016-09-15 DIAGNOSIS — I130 Hypertensive heart and chronic kidney disease with heart failure and stage 1 through stage 4 chronic kidney disease, or unspecified chronic kidney disease: Secondary | ICD-10-CM | POA: Diagnosis not present

## 2016-09-15 DIAGNOSIS — N183 Chronic kidney disease, stage 3 (moderate): Secondary | ICD-10-CM | POA: Diagnosis not present

## 2016-09-15 DIAGNOSIS — I69344 Monoplegia of lower limb following cerebral infarction affecting left non-dominant side: Secondary | ICD-10-CM | POA: Diagnosis not present

## 2016-09-15 DIAGNOSIS — I480 Paroxysmal atrial fibrillation: Secondary | ICD-10-CM | POA: Diagnosis not present

## 2016-09-15 DIAGNOSIS — L03116 Cellulitis of left lower limb: Secondary | ICD-10-CM | POA: Diagnosis not present

## 2016-09-15 DIAGNOSIS — I5032 Chronic diastolic (congestive) heart failure: Secondary | ICD-10-CM | POA: Diagnosis not present

## 2016-09-16 DIAGNOSIS — I69344 Monoplegia of lower limb following cerebral infarction affecting left non-dominant side: Secondary | ICD-10-CM | POA: Diagnosis not present

## 2016-09-16 DIAGNOSIS — N183 Chronic kidney disease, stage 3 (moderate): Secondary | ICD-10-CM | POA: Diagnosis not present

## 2016-09-16 DIAGNOSIS — I5032 Chronic diastolic (congestive) heart failure: Secondary | ICD-10-CM | POA: Diagnosis not present

## 2016-09-16 DIAGNOSIS — I480 Paroxysmal atrial fibrillation: Secondary | ICD-10-CM | POA: Diagnosis not present

## 2016-09-16 DIAGNOSIS — I130 Hypertensive heart and chronic kidney disease with heart failure and stage 1 through stage 4 chronic kidney disease, or unspecified chronic kidney disease: Secondary | ICD-10-CM | POA: Diagnosis not present

## 2016-09-16 DIAGNOSIS — L03116 Cellulitis of left lower limb: Secondary | ICD-10-CM | POA: Diagnosis not present

## 2016-09-19 DIAGNOSIS — N183 Chronic kidney disease, stage 3 (moderate): Secondary | ICD-10-CM | POA: Diagnosis not present

## 2016-09-19 DIAGNOSIS — I5032 Chronic diastolic (congestive) heart failure: Secondary | ICD-10-CM | POA: Diagnosis not present

## 2016-09-19 DIAGNOSIS — I480 Paroxysmal atrial fibrillation: Secondary | ICD-10-CM | POA: Diagnosis not present

## 2016-09-19 DIAGNOSIS — L03116 Cellulitis of left lower limb: Secondary | ICD-10-CM | POA: Diagnosis not present

## 2016-09-19 DIAGNOSIS — I69344 Monoplegia of lower limb following cerebral infarction affecting left non-dominant side: Secondary | ICD-10-CM | POA: Diagnosis not present

## 2016-09-19 DIAGNOSIS — I130 Hypertensive heart and chronic kidney disease with heart failure and stage 1 through stage 4 chronic kidney disease, or unspecified chronic kidney disease: Secondary | ICD-10-CM | POA: Diagnosis not present

## 2016-09-21 DIAGNOSIS — I69344 Monoplegia of lower limb following cerebral infarction affecting left non-dominant side: Secondary | ICD-10-CM | POA: Diagnosis not present

## 2016-09-21 DIAGNOSIS — L03116 Cellulitis of left lower limb: Secondary | ICD-10-CM | POA: Diagnosis not present

## 2016-09-21 DIAGNOSIS — I5032 Chronic diastolic (congestive) heart failure: Secondary | ICD-10-CM | POA: Diagnosis not present

## 2016-09-21 DIAGNOSIS — I130 Hypertensive heart and chronic kidney disease with heart failure and stage 1 through stage 4 chronic kidney disease, or unspecified chronic kidney disease: Secondary | ICD-10-CM | POA: Diagnosis not present

## 2016-09-21 DIAGNOSIS — I480 Paroxysmal atrial fibrillation: Secondary | ICD-10-CM | POA: Diagnosis not present

## 2016-09-21 DIAGNOSIS — N183 Chronic kidney disease, stage 3 (moderate): Secondary | ICD-10-CM | POA: Diagnosis not present

## 2016-09-22 DIAGNOSIS — I509 Heart failure, unspecified: Secondary | ICD-10-CM | POA: Diagnosis not present

## 2016-09-22 DIAGNOSIS — Z7901 Long term (current) use of anticoagulants: Secondary | ICD-10-CM | POA: Diagnosis not present

## 2016-09-22 DIAGNOSIS — Z5181 Encounter for therapeutic drug level monitoring: Secondary | ICD-10-CM | POA: Diagnosis not present

## 2016-09-22 DIAGNOSIS — I4891 Unspecified atrial fibrillation: Secondary | ICD-10-CM | POA: Diagnosis not present

## 2016-09-26 DIAGNOSIS — I5032 Chronic diastolic (congestive) heart failure: Secondary | ICD-10-CM | POA: Diagnosis not present

## 2016-09-26 DIAGNOSIS — I480 Paroxysmal atrial fibrillation: Secondary | ICD-10-CM | POA: Diagnosis not present

## 2016-09-26 DIAGNOSIS — I69344 Monoplegia of lower limb following cerebral infarction affecting left non-dominant side: Secondary | ICD-10-CM | POA: Diagnosis not present

## 2016-09-26 DIAGNOSIS — I130 Hypertensive heart and chronic kidney disease with heart failure and stage 1 through stage 4 chronic kidney disease, or unspecified chronic kidney disease: Secondary | ICD-10-CM | POA: Diagnosis not present

## 2016-09-26 DIAGNOSIS — L03116 Cellulitis of left lower limb: Secondary | ICD-10-CM | POA: Diagnosis not present

## 2016-09-26 DIAGNOSIS — N183 Chronic kidney disease, stage 3 (moderate): Secondary | ICD-10-CM | POA: Diagnosis not present

## 2016-09-27 DIAGNOSIS — L03116 Cellulitis of left lower limb: Secondary | ICD-10-CM | POA: Diagnosis not present

## 2016-09-27 DIAGNOSIS — I130 Hypertensive heart and chronic kidney disease with heart failure and stage 1 through stage 4 chronic kidney disease, or unspecified chronic kidney disease: Secondary | ICD-10-CM | POA: Diagnosis not present

## 2016-09-27 DIAGNOSIS — I480 Paroxysmal atrial fibrillation: Secondary | ICD-10-CM | POA: Diagnosis not present

## 2016-09-27 DIAGNOSIS — I69344 Monoplegia of lower limb following cerebral infarction affecting left non-dominant side: Secondary | ICD-10-CM | POA: Diagnosis not present

## 2016-09-27 DIAGNOSIS — I5032 Chronic diastolic (congestive) heart failure: Secondary | ICD-10-CM | POA: Diagnosis not present

## 2016-09-27 DIAGNOSIS — N183 Chronic kidney disease, stage 3 (moderate): Secondary | ICD-10-CM | POA: Diagnosis not present

## 2016-09-28 DIAGNOSIS — N183 Chronic kidney disease, stage 3 (moderate): Secondary | ICD-10-CM | POA: Diagnosis not present

## 2016-09-28 DIAGNOSIS — I480 Paroxysmal atrial fibrillation: Secondary | ICD-10-CM | POA: Diagnosis not present

## 2016-09-28 DIAGNOSIS — I5032 Chronic diastolic (congestive) heart failure: Secondary | ICD-10-CM | POA: Diagnosis not present

## 2016-09-28 DIAGNOSIS — I130 Hypertensive heart and chronic kidney disease with heart failure and stage 1 through stage 4 chronic kidney disease, or unspecified chronic kidney disease: Secondary | ICD-10-CM | POA: Diagnosis not present

## 2016-09-28 DIAGNOSIS — I69344 Monoplegia of lower limb following cerebral infarction affecting left non-dominant side: Secondary | ICD-10-CM | POA: Diagnosis not present

## 2016-09-28 DIAGNOSIS — L03116 Cellulitis of left lower limb: Secondary | ICD-10-CM | POA: Diagnosis not present

## 2016-09-30 DIAGNOSIS — I5032 Chronic diastolic (congestive) heart failure: Secondary | ICD-10-CM | POA: Diagnosis not present

## 2016-09-30 DIAGNOSIS — I69344 Monoplegia of lower limb following cerebral infarction affecting left non-dominant side: Secondary | ICD-10-CM | POA: Diagnosis not present

## 2016-09-30 DIAGNOSIS — I130 Hypertensive heart and chronic kidney disease with heart failure and stage 1 through stage 4 chronic kidney disease, or unspecified chronic kidney disease: Secondary | ICD-10-CM | POA: Diagnosis not present

## 2016-09-30 DIAGNOSIS — N183 Chronic kidney disease, stage 3 (moderate): Secondary | ICD-10-CM | POA: Diagnosis not present

## 2016-09-30 DIAGNOSIS — I480 Paroxysmal atrial fibrillation: Secondary | ICD-10-CM | POA: Diagnosis not present

## 2016-09-30 DIAGNOSIS — L03116 Cellulitis of left lower limb: Secondary | ICD-10-CM | POA: Diagnosis not present

## 2016-10-03 DIAGNOSIS — L03116 Cellulitis of left lower limb: Secondary | ICD-10-CM | POA: Diagnosis not present

## 2016-10-03 DIAGNOSIS — I5032 Chronic diastolic (congestive) heart failure: Secondary | ICD-10-CM | POA: Diagnosis not present

## 2016-10-03 DIAGNOSIS — I69344 Monoplegia of lower limb following cerebral infarction affecting left non-dominant side: Secondary | ICD-10-CM | POA: Diagnosis not present

## 2016-10-03 DIAGNOSIS — I480 Paroxysmal atrial fibrillation: Secondary | ICD-10-CM | POA: Diagnosis not present

## 2016-10-03 DIAGNOSIS — N183 Chronic kidney disease, stage 3 (moderate): Secondary | ICD-10-CM | POA: Diagnosis not present

## 2016-10-03 DIAGNOSIS — I130 Hypertensive heart and chronic kidney disease with heart failure and stage 1 through stage 4 chronic kidney disease, or unspecified chronic kidney disease: Secondary | ICD-10-CM | POA: Diagnosis not present

## 2016-10-04 DIAGNOSIS — I130 Hypertensive heart and chronic kidney disease with heart failure and stage 1 through stage 4 chronic kidney disease, or unspecified chronic kidney disease: Secondary | ICD-10-CM | POA: Diagnosis not present

## 2016-10-04 DIAGNOSIS — I480 Paroxysmal atrial fibrillation: Secondary | ICD-10-CM | POA: Diagnosis not present

## 2016-10-04 DIAGNOSIS — I69344 Monoplegia of lower limb following cerebral infarction affecting left non-dominant side: Secondary | ICD-10-CM | POA: Diagnosis not present

## 2016-10-04 DIAGNOSIS — N183 Chronic kidney disease, stage 3 (moderate): Secondary | ICD-10-CM | POA: Diagnosis not present

## 2016-10-04 DIAGNOSIS — L03116 Cellulitis of left lower limb: Secondary | ICD-10-CM | POA: Diagnosis not present

## 2016-10-04 DIAGNOSIS — I5032 Chronic diastolic (congestive) heart failure: Secondary | ICD-10-CM | POA: Diagnosis not present

## 2016-10-05 ENCOUNTER — Emergency Department (HOSPITAL_COMMUNITY): Payer: Medicare HMO

## 2016-10-05 ENCOUNTER — Emergency Department (EMERGENCY_DEPARTMENT_HOSPITAL)
Admission: EM | Admit: 2016-10-05 | Discharge: 2016-10-05 | Disposition: A | Discharge status: Home / Self Care | Payer: Medicare HMO | Source: Home / Self Care | Attending: Emergency Medicine | Admitting: Emergency Medicine

## 2016-10-05 ENCOUNTER — Encounter (HOSPITAL_COMMUNITY): Payer: Self-pay | Admitting: Emergency Medicine

## 2016-10-05 DIAGNOSIS — T461X5A Adverse effect of calcium-channel blockers, initial encounter: Secondary | ICD-10-CM | POA: Diagnosis not present

## 2016-10-05 DIAGNOSIS — L03116 Cellulitis of left lower limb: Secondary | ICD-10-CM | POA: Diagnosis not present

## 2016-10-05 DIAGNOSIS — N179 Acute kidney failure, unspecified: Secondary | ICD-10-CM | POA: Diagnosis not present

## 2016-10-05 DIAGNOSIS — I952 Hypotension due to drugs: Secondary | ICD-10-CM | POA: Diagnosis not present

## 2016-10-05 DIAGNOSIS — I10 Essential (primary) hypertension: Secondary | ICD-10-CM

## 2016-10-05 DIAGNOSIS — Z888 Allergy status to other drugs, medicaments and biological substances status: Secondary | ICD-10-CM | POA: Diagnosis not present

## 2016-10-05 DIAGNOSIS — I509 Heart failure, unspecified: Secondary | ICD-10-CM | POA: Insufficient documentation

## 2016-10-05 DIAGNOSIS — M810 Age-related osteoporosis without current pathological fracture: Secondary | ICD-10-CM | POA: Diagnosis not present

## 2016-10-05 DIAGNOSIS — I110 Hypertensive heart disease with heart failure: Secondary | ICD-10-CM

## 2016-10-05 DIAGNOSIS — I69344 Monoplegia of lower limb following cerebral infarction affecting left non-dominant side: Secondary | ICD-10-CM | POA: Diagnosis not present

## 2016-10-05 DIAGNOSIS — I482 Chronic atrial fibrillation: Secondary | ICD-10-CM | POA: Diagnosis not present

## 2016-10-05 DIAGNOSIS — R069 Unspecified abnormalities of breathing: Secondary | ICD-10-CM | POA: Diagnosis not present

## 2016-10-05 DIAGNOSIS — I4891 Unspecified atrial fibrillation: Secondary | ICD-10-CM | POA: Insufficient documentation

## 2016-10-05 DIAGNOSIS — Z79899 Other long term (current) drug therapy: Secondary | ICD-10-CM | POA: Insufficient documentation

## 2016-10-05 DIAGNOSIS — N183 Chronic kidney disease, stage 3 (moderate): Secondary | ICD-10-CM | POA: Diagnosis not present

## 2016-10-05 DIAGNOSIS — R404 Transient alteration of awareness: Secondary | ICD-10-CM | POA: Diagnosis not present

## 2016-10-05 DIAGNOSIS — R0602 Shortness of breath: Secondary | ICD-10-CM | POA: Diagnosis not present

## 2016-10-05 DIAGNOSIS — R918 Other nonspecific abnormal finding of lung field: Secondary | ICD-10-CM | POA: Diagnosis not present

## 2016-10-05 DIAGNOSIS — I480 Paroxysmal atrial fibrillation: Secondary | ICD-10-CM | POA: Diagnosis not present

## 2016-10-05 DIAGNOSIS — Z885 Allergy status to narcotic agent status: Secondary | ICD-10-CM | POA: Diagnosis not present

## 2016-10-05 DIAGNOSIS — Z950 Presence of cardiac pacemaker: Secondary | ICD-10-CM | POA: Diagnosis not present

## 2016-10-05 DIAGNOSIS — Z7901 Long term (current) use of anticoagulants: Secondary | ICD-10-CM | POA: Diagnosis not present

## 2016-10-05 DIAGNOSIS — Z6835 Body mass index (BMI) 35.0-35.9, adult: Secondary | ICD-10-CM | POA: Diagnosis not present

## 2016-10-05 DIAGNOSIS — E669 Obesity, unspecified: Secondary | ICD-10-CM | POA: Diagnosis present

## 2016-10-05 DIAGNOSIS — K219 Gastro-esophageal reflux disease without esophagitis: Secondary | ICD-10-CM | POA: Diagnosis present

## 2016-10-05 DIAGNOSIS — I959 Hypotension, unspecified: Secondary | ICD-10-CM | POA: Diagnosis not present

## 2016-10-05 DIAGNOSIS — Z8673 Personal history of transient ischemic attack (TIA), and cerebral infarction without residual deficits: Secondary | ICD-10-CM | POA: Diagnosis not present

## 2016-10-05 DIAGNOSIS — I5032 Chronic diastolic (congestive) heart failure: Secondary | ICD-10-CM

## 2016-10-05 DIAGNOSIS — I2510 Atherosclerotic heart disease of native coronary artery without angina pectoris: Secondary | ICD-10-CM | POA: Diagnosis not present

## 2016-10-05 DIAGNOSIS — I361 Nonrheumatic tricuspid (valve) insufficiency: Secondary | ICD-10-CM | POA: Diagnosis not present

## 2016-10-05 DIAGNOSIS — I130 Hypertensive heart and chronic kidney disease with heart failure and stage 1 through stage 4 chronic kidney disease, or unspecified chronic kidney disease: Secondary | ICD-10-CM | POA: Diagnosis not present

## 2016-10-05 DIAGNOSIS — R531 Weakness: Secondary | ICD-10-CM | POA: Diagnosis not present

## 2016-10-05 DIAGNOSIS — M1990 Unspecified osteoarthritis, unspecified site: Secondary | ICD-10-CM | POA: Diagnosis not present

## 2016-10-05 DIAGNOSIS — R000 Tachycardia, unspecified: Secondary | ICD-10-CM | POA: Diagnosis not present

## 2016-10-05 LAB — CBC
HEMATOCRIT: 46.3 % — AB (ref 36.0–46.0)
Hemoglobin: 15.6 g/dL — ABNORMAL HIGH (ref 12.0–15.0)
MCH: 32.8 pg (ref 26.0–34.0)
MCHC: 33.7 g/dL (ref 30.0–36.0)
MCV: 97.5 fL (ref 78.0–100.0)
Platelets: 321 10*3/uL (ref 150–400)
RBC: 4.75 MIL/uL (ref 3.87–5.11)
RDW: 14.7 % (ref 11.5–15.5)
WBC: 12.6 10*3/uL — AB (ref 4.0–10.5)

## 2016-10-05 LAB — PROTIME-INR
INR: 2.64
PROTHROMBIN TIME: 28 s — AB (ref 11.4–15.2)

## 2016-10-05 LAB — URINALYSIS, ROUTINE W REFLEX MICROSCOPIC
Bilirubin Urine: NEGATIVE
Glucose, UA: NEGATIVE mg/dL
Ketones, ur: NEGATIVE mg/dL
Leukocytes, UA: NEGATIVE
Nitrite: NEGATIVE
PROTEIN: 30 mg/dL — AB
SPECIFIC GRAVITY, URINE: 1.019 (ref 1.005–1.030)
pH: 6 (ref 5.0–8.0)

## 2016-10-05 LAB — I-STAT TROPONIN, ED: TROPONIN I, POC: 0 ng/mL (ref 0.00–0.08)

## 2016-10-05 MED ORDER — SODIUM CHLORIDE 0.9 % IV BOLUS (SEPSIS)
500.0000 mL | Freq: Once | INTRAVENOUS | Status: AC
  Administered 2016-10-05: 500 mL via INTRAVENOUS

## 2016-10-05 MED ORDER — SODIUM CHLORIDE 0.9 % IV SOLN: INTRAVENOUS | Status: DC

## 2016-10-05 MED ORDER — DILTIAZEM HCL ER COATED BEADS 240 MG PO TB24: 240.0000 mg | ORAL_TABLET | Freq: Every day | ORAL | 0 refills | Status: DC

## 2016-10-05 MED ORDER — DILTIAZEM HCL 60 MG PO TABS
60.0000 mg | ORAL_TABLET | Freq: Once | ORAL | Status: AC
  Administered 2016-10-05: 60 mg via ORAL
  Filled 2016-10-05: qty 1

## 2016-10-05 MED ORDER — DEXTROSE 5 % IV SOLN
5.0000 mg/h | Freq: Once | INTRAVENOUS | Status: AC
  Administered 2016-10-05: 5 mg/h via INTRAVENOUS
  Filled 2016-10-05: qty 100

## 2016-10-05 MED ORDER — FUROSEMIDE 10 MG/ML IJ SOLN
20.0000 mg | Freq: Once | INTRAMUSCULAR | Status: AC
  Administered 2016-10-05: 20 mg via INTRAVENOUS
  Filled 2016-10-05: qty 2

## 2016-10-05 NOTE — ED Triage Notes (Signed)
Patient from home after feeling like her heart was racing and she felt short of breath.  On EMS arrival to home patient found to be in afib with rate 140.  Given 20mg  cardizem, rate decreased to 104, reports improvement in shortness of breath.  20g saline lock in left AC.  Patient in no apparent distress at this time.

## 2016-10-05 NOTE — Consult Note (Signed)
Cardiology Consultation:   Patient ID: [name]; [id number]; 1923/02/01   Admit date: 10/05/2016 Date of Consult: 10/05/2016  Primary Care Provider: Charlott Rakes, MD Primary Cardiologist: Dr Gala Romney Primary Electrophysiologist:  Dr. Graciela Husbands   Patient Profile:   [name] is a 83 y.o. female with a hx of diastolic heart failure, chronic afib complicated by tachy/brady syndrome, S/P PPM 2010, chronic dyspnea, HTN, and GERD who is being seen today for the evaluation of atrial fibrillation with rapid ventricular response at the request of Dr. Denton Lank.  History of Present Illness:   Ms. Coggeshall presented to the [facility] ED with complaints of feeling that her heart rate was fast for the past few days. She was found to be in afib with RVR rates in the 140's by EMS and given cardizem 20 mg IV. Currently she is on cardizem drip at 5 mg/hr and heart rates are in the 90's.  The patient says that when she awoke yesterday am and on her way to the bathroom she felt that her heart was flying. She was mildly short of breath and had a feeling of indigestion and feeling like she had to burp. She took a Tums and the indigestion went away. She felt like her heart rate was up all day. This morning she told her home care nurse that her heart was beating fast and she felt uneasy in her chest. The nurse called EMS, though the patient felt this was unnecessary. She admits to feeling weak, especially this morning, having to hold onto furniture as she walks. She had her usual 2 pillow orthopnea last night. She has only trace edema. Her breathing is slightly more difficult than her usual DOE.   She was recently admitted 09/06/16-09/10/16 with LLE cellulitis. She was given fluid resuscitation, required IV lasix. Discharge wt was 183 lbs. At follow up on 09/12/16 wt was 188 lbs.   She was seen in the past by Dr. Graciela Husbands for evaluation of atrial fib and noted that nealry 40% of her heart rates were >110. He suggested  amiodarone vs AV node ablation and she was started on amiodarone with improvement in symptoms.   ECHO 07/2009: EF 55-60%, mild MR, LA mod dilated, RA mild dilated ECHO 10/14: EF 55-60%, biatrial enlargement, normal RV size/systolic function, PA systolic pressure 39 mmHg.  ECHO 7/17:  EF 55-60% RVSP 39 biatrial enlargement.  09/2010   L Main normal LAD mild narrowing in the proximal and midvessel. The first diagonal branch has a 40% ostial stenosis. The left circumflex coronary artery has mild irregularities up to 10- 20%. The right coronary artery is a dominant vessel. It has mild wall irregularities less than or equal to 10%.   Past Medical History:  Diagnosis Date  . Anticoagulation goal of INR 2 to 3 08/21/2015  . Arthritis    oa  . Balance problem   . Chronic diastolic CHF (congestive heart failure) (HCC)   . Dyspnea   . GERD (gastroesophageal reflux disease)   . Hyperlipidemia   . Hypertension   . Hypotensive episode 08/21/2015  . Mild CAD    a. nonobst CAD by cath 09/2010.  . Obesity   . Osteoporosis   . Pacemaker-St. Jude   . Permanent atrial fibrillation (HCC)    a. c/b tachy-brady syndrome s/p PPM 2010.  [name] Stroke [facility]) 07/2013    Past Surgical History:  Procedure Laterality Date  . ABDOMINAL HYSTERECTOMY    . BREAST LUMPECTOMY Right   . CARDIAC  CATHETERIZATION    . COLECTOMY  1991  . INSERT / REPLACE / REMOVE PACEMAKER     St Jude  . SPLENECTOMY    . TOTAL ABDOMINAL HYSTERECTOMY W/ BILATERAL SALPINGOOPHORECTOMY  1974     Home Medications:  Prior to Admission medications   Medication Sig Start Date End Date Taking? Authorizing Provider  amiodarone (PACERONE) 200 MG tablet Take 1 tablet (200 mg total) by mouth daily. 05/03/16   Derryl Uher, Bevelyn Buckles, MD  calcium carbonate (TUMS) 500 MG chewable tablet Chew 1 tablet by mouth daily as needed for indigestion or heartburn.    [provider]  carbidopa-levodopa (SINEMET CR) 50-200 MG tablet Take 1 tablet by mouth 2  (two) times daily. 08/10/15   [provider]  diltiazem (CARDIZEM) 120 MG tablet Take 1 tablet (120 mg total) by mouth daily. 08/08/16   Graciella Freer, PA-C  furosemide (LASIX) 20 MG tablet Take 1 tablet (20 mg total) by mouth daily. 09/12/16   Little Ishikawa, NP  hydroxychloroquine (PLAQUENIL) 200 MG tablet Take 200 mg by mouth 2 (two) times daily.    [provider]  metoprolol tartrate (LOPRESSOR) 50 MG tablet Take 25 mg by mouth 2 (two) times daily.     [provider]  omeprazole (PRILOSEC) 20 MG capsule Take 20 mg by mouth daily.      [provider]  rOPINIRole (REQUIP) 0.5 MG tablet Take 0.5 mg by mouth at bedtime.    [provider]  tiZANidine (ZANAFLEX) 2 MG tablet Take 2 mg by mouth daily. 08/02/16   [provider]  warfarin (COUMADIN) 3 MG tablet Take 1 tablet (3 mg total) by mouth daily. 09/10/16 09/10/17  Albertine Grates, MD    Inpatient Medications: Scheduled Meds:  Continuous Infusions: . sodium chloride     PRN Meds:   Allergies:    Allergies  Allergen Reactions  . Lipitor [Atorvastatin] Other (See Comments)    Myalgias (noted in neurologist note on 08/23/2013 when admitted for acute CVA).  08/20/15:  Pravastatin started at that time. However, patient is not taking Pravastatin/told okay to stop it by PCP. [facility] said it was last filled in 07/2013)   . Morphine And Related Other (See Comments)    hallucinations    Social History:   Social History   Social History  . Marital status: Widowed    Spouse name: N/A  . Number of children: N/A  . Years of education: N/A   Occupational History  . Not on file.   Social History Main Topics  . Smoking status: Never Smoker  . Smokeless tobacco: Never Used  . Alcohol use Not on file  . Drug use: Unknown  . Sexual activity: Not on file   Other Topics Concern  . Not on file   Social History Narrative  . No narrative on file    Family History:    Family  History  Problem Relation Age of Onset  . Cancer Other   . Heart disease Other   . Hypertension Other      ROS:  Please see the history of present illness.  ROS  All other ROS reviewed and negative.     Physical Exam/Data:   Vitals:   10/05/16 1030 10/05/16 1045 10/05/16 1230 10/05/16 1300  BP: 131/85 (!) 139/99 (!) 144/93 (!) 134/94  Pulse:    75  Resp:    18  Temp:      TempSrc:  SpO2: 94% 95% 94% 95%   No intake or output data in the 24 hours ending 10/05/16 1322 There were no vitals filed for this visit. There is no height or weight on file to calculate BMI.  General:  Well nourished, well developed, eldrely female with chronic tremor in no acute distress HEENT: normal Lymph: no adenopathy Neck: no JVD Endocrine:  No thryomegaly Vascular: No carotid bruits; 2+ pedal pulses Cardiac:  normal S1, S2; irregularly irregular rhythm; no murmur  Lungs:  clear to auscultation bilaterally, no wheezing, rhonchi or rales  Abd: soft, nontender, no hepatomegaly  Ext: Trace LE edema Musculoskeletal:  Kyphosis, BUE and BLE strength normal and equal Skin: warm and dry  Neuro:  CNs 2-12 intact, no focal abnormalities noted Psych:  Normal affect   EKG:  The EKG was personally reviewed and demonstrates:  Atrial fibrillation at 121 bpm, poor quality tracing, repeat EKG with atrial fib at 98 bpm, no acute ischemic changes  Telemetry:  Telemetry was personally reviewed and demonstrates:  Atrial fibrillation in the 90's  Relevant CV Studies: Echocardiogram 08/19/2015 Study Conclusions  - Left ventricle: The cavity size was normal. Wall thickness was   normal. Systolic function was normal. The estimated ejection   fraction was in the range of 55% to 60%. Images were inadequate   for LV wall motion assessment. The study is not technically   sufficient to allow evaluation of LV diastolic function. - Aortic valve: Sclerosis without stenosis. There was trivial   regurgitation. -  Mitral valve: Mildly thickened leaflets . There was mild   regurgitation. - Left atrium: Moderately dilated. - Right ventricle: Poorly visualized. - Right atrium: Moderately dilated. - Tricuspid valve: There was mild regurgitation. - Pulmonary arteries: PA peak pressure: 39 mm Hg (S). - Inferior vena cava: The vessel was dilated. The respirophasic   diameter changes were blunted (< 50%), consistent with elevated   central venous pressure.  Impressions:  - Compared to a prior echo in 07/2013, there are no significant   changes.  Laboratory Data:  Chemistry Recent Labs Lab 10/05/16 1019  NA 138  K 3.5  CL 105  CO2 21*  GLUCOSE 103*  BUN 9  CREATININE 1.16*  CALCIUM 8.6*  GFRNONAA 40*  GFRAA 46*  ANIONGAP 12     Recent Labs Lab 10/05/16 1019  PROT 7.1  ALBUMIN 3.0*  AST 28  ALT 13*  ALKPHOS 102  BILITOT 0.9   Hematology Recent Labs Lab 10/05/16 1019  WBC 12.6*  RBC 4.75  HGB 15.6*  HCT 46.3*  MCV 97.5  MCH 32.8  MCHC 33.7  RDW 14.7  PLT 321   Cardiac EnzymesNo results for input(s): TROPONINI in the last 168 hours.  Recent Labs Lab 10/05/16 1039  TROPIPOC 0.00    BNPNo results for input(s): BNP, PROBNP in the last 168 hours.  DDimer No results for input(s): DDIMER in the last 168 hours.  Radiology/Studies:  Dg Chest Port 1 View  Result Date: 10/05/2016 CLINICAL DATA:  83 year old female with shortness of breath. Chronic atrial fibrillation with subjective tachycardia recently. EXAM: PORTABLE CHEST 1 VIEW COMPARISON:  09/06/2016 and earlier. FINDINGS: Portable AP upright view at 1236 hours. Stable cardiomegaly and mediastinal contours. Chronic hiatal hernia. Stable left chest dual lead cardiac pacemaker. Pulmonary vascularity does appear mildly progressed, with basilar predominant congestion. Chronic increased AP dimension to the chest better demonstrated on the two view study 08/18/2015. No definite pleural effusions. No pneumothorax. Chronic  retrocardiac atelectasis. No other  confluent pulmonary opacity. IMPRESSION: 1. Increased basilar predominant pulmonary vascularity suspicious for mild or developing interstitial edema in this clinical setting. 2. No other acute cardiopulmonary abnormality. 3. Chronic hiatal hernia and mild cardiomegaly. Electronically Signed   By: Odessa Fleming M.D.   On: 10/05/2016 12:56    Assessment and Plan:   1. Atrial fibrillation with RVR -Pt noted elevated heart rate starting yesterday morning and associated with mild transient chest discomfort like indigestion, none at present, and mild increase in her usual DOE and weakness.  -Pt with chronic atrial fibrillation complicated by tachy-brady syndrome S/P PPM  -Usual medical therapy includes Cardizem 120 mg and amiodarone 200 mg and metoprolol 25 mg bid for rate control. She is compliant with her meds.  -CHA2DS2/VAS Stroke Risk Score is 8 (CHF, Vasc Dz, HTN, Age (2), Stroke (2), female), Pt is anticoagulated with warfarin for stroke risk reduction. INR is therapeutic at 2.64. -Currently on cardizem drip at 5 mg/hr with atrial fib in the 90's -Likely needs medication adjustment. Will defer to Dr. Gala Romney as he knows her. Per Dr.Tarhonda Hollenberg we will give the patient 60 mg of oral cardizem now and increase her daily dose to 240 mg and pt can be discharged home.  -I will arrange for follow up with Dr. Gala Romney.   2. Chronic diastolic heart failure -NYHA class III -Echo in 08/2015 with EF 55-60% -Lasix for volume control at 20 mg daily per home routine -SCr 1.16,  K+ 3.5 -CXR suspicious for mild or developing interstitial edema -Continue metoprolol 25 mg bid   3. HTN -Well controlled on current regimen -BP is elevated here, pt has not taken her home meds for today.  4. S/P CVA 2015 -Continue warfarin. No aspirin while on warfarin.     Signed, Berton Bon, NP  10/05/2016 1:22 PM  Patient seen and examined with the above-signed Advanced Practice Provider  and/or Housestaff. I personally reviewed laboratory data, imaging studies and relevant notes. I independently examined the patient and formulated the important aspects of the plan. I have edited the note to reflect any of my changes or salient points. I have personally discussed the plan with the patient and/or family.  83 y/o with chronic AF and tachy-brady syndrome s/p PPM. Presents to ER with AF with RVR. We have been following in Clinic and she has also seen Dr. Graciela Husbands in EP for fast rates. Currently on amio 200 daily, cardizem 120 daily and lopressor 25 bid. Rates 130-140 on arrival. Now 90s on cardizem 5. No evidence of significant HF on exam or by CXR. I discussed with Dr. Denton Lank. We will give her 60 mg short-acting cardizem and stop gtt. Will let her go home with increase cardizem to 240 daily. We will follow in clinic.   Arvilla Meres, MD  10:17 PM

## 2016-10-05 NOTE — ED Notes (Signed)
Patient verbalized understanding of discharge instructions and denies any further needs or questions at this time. VS stable. Patient ambulatory with steady gait. Assisted to ED entrance in wheelchair.   

## 2016-10-05 NOTE — ED Notes (Signed)
Pt attempted to give UA, no urine was collected in the bedpan, linen was soaked in urine. Linen was changed.

## 2016-10-05 NOTE — ED Provider Notes (Addendum)
[facility]-EMERGENCY DEPT Provider Note   CSN: [id number] Arrival date & time: 10/05/16  7893     History   Chief Complaint Chief Complaint  Patient presents with  . Atrial Fibrillation    HPI [name] is a 83 y.o. female.  Patient with hx chronic afib, c/o feeling heart rate has been fast in past few days. Symptoms moderate, persistent, worse this AM.  EMS found in afib w rvr, rate 140s and gave cardizem 20 mg iv.  Pt indicates in past couple days generally felt weak, with decreased appetite, and increased sob. No chest pain.   +sweats this AM. No fevers. Compliant w normal home meds.    The history is provided by the patient.  Atrial Fibrillation  Associated symptoms include shortness of breath. Pertinent negatives include no chest pain, no abdominal pain and no headaches.    Past Medical History:  Diagnosis Date  . Anticoagulation goal of INR 2 to 3 08/21/2015  . Arthritis    oa  . Balance problem   . Chronic diastolic CHF (congestive heart failure) (HCC)   . Dyspnea   . GERD (gastroesophageal reflux disease)   . Hyperlipidemia   . Hypertension   . Hypotensive episode 08/21/2015  . Mild CAD    a. nonobst CAD by cath 09/2010.  . Obesity   . Osteoporosis   . Pacemaker-St. Jude   . Permanent atrial fibrillation (HCC)    a. c/b tachy-brady syndrome s/p PPM 2010.  [name] Stroke [facility]) 07/2013    Patient Active Problem List   Diagnosis Date Noted  . Sepsis (HCC) 09/06/2016  . Cellulitis 09/06/2016  . Atrial fibrillation with RVR (HCC) 09/06/2016  . Tremor 09/06/2016  . Anticoagulation goal of INR 2 to 3 08/21/2015  . Hypotensive episode 08/21/2015  . Pain in the chest   . Accelerated hypertension 08/18/2015  . Elevated troponin- for one draw- may be spurious 08/18/2015  . Vertigo 04/11/2014  . Chest pain 03/21/2014  . Viral syndrome 03/21/2014  . Nausea 03/21/2014  . Diarrhea 03/21/2014  . Pulmonary nodule 03/21/2014  . CVA (cerebral infarction) 08/21/2013  . TIA  (transient ischemic attack) 08/21/2013  . Shortness of breath 04/10/2013  . Cardiac pacemaker-St. Lauralee [name] 03/05/2013  . Chronic diastolic heart failure (HCC) 10/18/2011  . Chronic kidney disease 09/06/2010  . CVA 07/07/2009  . SICK SINUS SYNDROME 02/03/2009  . SHOULDER PAIN, LEFT, CHRONIC 02/03/2009  . INF&INFLAM REACT DUE CARD DEVICE IMPLANT&GRAFT 10/20/2008  . HYPERLIPIDEMIA-MIXED 06/04/2008  . Essential hypertension 06/04/2008  . Chronic atrial fibrillation (HCC) 06/04/2008    Past Surgical History:  Procedure Laterality Date  . ABDOMINAL HYSTERECTOMY    . BREAST LUMPECTOMY Right   . CARDIAC CATHETERIZATION    . COLECTOMY  1991  . INSERT / REPLACE / REMOVE PACEMAKER     St Jude  . SPLENECTOMY    . TOTAL ABDOMINAL HYSTERECTOMY W/ BILATERAL SALPINGOOPHORECTOMY  1974    OB History    No data available       Home Medications    Prior to Admission medications   Medication Sig Start Date End Date Taking? Authorizing Provider  amiodarone (PACERONE) 200 MG tablet Take 1 tablet (200 mg total) by mouth daily. 05/03/16   Bensimhon, Bevelyn Buckles, MD  calcium carbonate (TUMS) 500 MG chewable tablet Chew 1 tablet by mouth daily as needed for indigestion or heartburn.    [provider]  carbidopa-levodopa (SINEMET CR) 50-200 MG tablet Take 1 tablet by mouth 2 (two)  times daily. 08/10/15   [provider]  diltiazem (CARDIZEM) 120 MG tablet Take 1 tablet (120 mg total) by mouth daily. 08/08/16   Graciella Freer, PA-C  furosemide (LASIX) 20 MG tablet Take 1 tablet (20 mg total) by mouth daily. 09/12/16   Little Ishikawa, NP  hydroxychloroquine (PLAQUENIL) 200 MG tablet Take 200 mg by mouth 2 (two) times daily.    [provider]  metoprolol tartrate (LOPRESSOR) 50 MG tablet Take 25 mg by mouth 2 (two) times daily.     [provider]  omeprazole (PRILOSEC) 20 MG capsule Take 20 mg by mouth daily.      [provider]  rOPINIRole (REQUIP) 0.5 MG  tablet Take 0.5 mg by mouth at bedtime.    [provider]  tiZANidine (ZANAFLEX) 2 MG tablet Take 2 mg by mouth daily. 08/02/16   [provider]  warfarin (COUMADIN) 3 MG tablet Take 1 tablet (3 mg total) by mouth daily. 09/10/16 09/10/17  Albertine Grates, MD    Family History Family History  Problem Relation Age of Onset  . Cancer Other   . Heart disease Other   . Hypertension Other     Social History Social History  Substance Use Topics  . Smoking status: Never Smoker  . Smokeless tobacco: Never Used  . Alcohol use Not on file     Allergies   Lipitor [atorvastatin] and Morphine and related   Review of Systems Review of Systems  Constitutional: Positive for diaphoresis. Negative for fever.  HENT: Negative for sore throat.   Eyes: Negative for redness.  Respiratory: Positive for shortness of breath.   Cardiovascular: Negative for chest pain and leg swelling.  Gastrointestinal: Negative for abdominal pain.  Genitourinary: Negative for dysuria.  Musculoskeletal: Positive for back pain.  Skin: Negative for rash.  Neurological: Negative for headaches.  Hematological:       On coumadin.  Psychiatric/Behavioral: Negative for confusion.     Physical Exam Updated Vital Signs BP (!) 152/126 (BP Location: Left Arm)   Pulse 95   Temp 98.7 F (37.1 C) (Oral)   Resp (!) 21   SpO2 94%   Physical Exam  Constitutional: She appears well-developed and well-nourished. No distress.  HENT:  Head: Atraumatic.  Eyes: Conjunctivae are normal. No scleral icterus.  Neck: Neck supple. No tracheal deviation present.  Cardiovascular: Normal rate.   Irregular rhythm. Tachycardic.   Pulmonary/Chest: Effort normal. No respiratory distress.  Abdominal: Soft. Normal appearance. She exhibits no distension. There is no tenderness.  No puls mass.   Genitourinary:  Genitourinary Comments: No cva tenderness.   Musculoskeletal: She exhibits no edema.  Neurological: She is alert.    Skin: Skin is warm and dry. No rash noted. She is not diaphoretic.  Psychiatric: She has a normal mood and affect.  Nursing note and vitals reviewed.    ED Treatments / Results  Labs (all labs ordered are listed, but only abnormal results are displayed) Results for orders placed or performed during the hospital encounter of 10/05/16  CBC  Result Value Ref Range   WBC 12.6 (H) 4.0 - 10.5 K/uL   RBC 4.75 3.87 - 5.11 MIL/uL   Hemoglobin 15.6 (H) 12.0 - 15.0 g/dL   HCT 62.2 (H) 63.3 - 35.4 %   MCV 97.5 78.0 - 100.0 fL   MCH 32.8 26.0 - 34.0 pg   MCHC 33.7 30.0 - 36.0 g/dL   RDW 56.2 56.3 - 89.3 %   Platelets  321 150 - 400 K/uL  Comprehensive metabolic panel  Result Value Ref Range   Sodium 138 135 - 145 mmol/L   Potassium 3.5 3.5 - 5.1 mmol/L   Chloride 105 101 - 111 mmol/L   CO2 21 (L) 22 - 32 mmol/L   Glucose, Bld 103 (H) 65 - 99 mg/dL   BUN 9 6 - 20 mg/dL   Creatinine, Ser 5.28 (H) 0.44 - 1.00 mg/dL   Calcium 8.6 (L) 8.9 - 10.3 mg/dL   Total Protein 7.1 6.5 - 8.1 g/dL   Albumin 3.0 (L) 3.5 - 5.0 g/dL   AST 28 15 - 41 U/L   ALT 13 (L) 14 - 54 U/L   Alkaline Phosphatase 102 38 - 126 U/L   Total Bilirubin 0.9 0.3 - 1.2 mg/dL   GFR calc non Af Amer 40 (L) >60 mL/min   GFR calc Af Amer 46 (L) >60 mL/min   Anion gap 12 5 - 15  Protime-INR  Result Value Ref Range   Prothrombin Time 28.0 (H) 11.4 - 15.2 seconds   INR 2.64   I-stat troponin, ED  Result Value Ref Range   Troponin i, poc 0.00 0.00 - 0.08 ng/mL   Comment 3             EKG  EKG Interpretation  Date/Time:  Wednesday October 05 2016 10:09:38 EDT Ventricular Rate:  121 PR Interval:    QRS Duration: 115 QT Interval:  364 QTC Calculation: 519 R Axis:   100 Text Interpretation:  Atrial fibrillation Low voltage, extremity and precordial leads Poor data quality Confirmed by Cathren Laine ([id number]) on 10/05/2016 10:17:52 AM       Radiology Dg Chest Port 1 View  Result Date: 10/05/2016 CLINICAL DATA:   83 year old female with shortness of breath. Chronic atrial fibrillation with subjective tachycardia recently. EXAM: PORTABLE CHEST 1 VIEW COMPARISON:  09/06/2016 and earlier. FINDINGS: Portable AP upright view at 1236 hours. Stable cardiomegaly and mediastinal contours. Chronic hiatal hernia. Stable left chest dual lead cardiac pacemaker. Pulmonary vascularity does appear mildly progressed, with basilar predominant congestion. Chronic increased AP dimension to the chest better demonstrated on the two view study 08/18/2015. No definite pleural effusions. No pneumothorax. Chronic retrocardiac atelectasis. No other confluent pulmonary opacity. IMPRESSION: 1. Increased basilar predominant pulmonary vascularity suspicious for mild or developing interstitial edema in this clinical setting. 2. No other acute cardiopulmonary abnormality. 3. Chronic hiatal hernia and mild cardiomegaly. Electronically Signed   By: Odessa Fleming M.D.   On: 10/05/2016 12:56    Procedures Procedures (including critical care time)  Medications Ordered in ED Medications  0.9 %  sodium chloride infusion (not administered)     Initial Impression / Assessment and Plan / ED Course  I have reviewed the triage vital signs and the nursing notes.  Pertinent labs & imaging results that were available during my care of the patient were reviewed by me and considered in my medical decision making (see chart for details).  Iv ns. Labs. Pcxr. Ecg.   Reviewed nursing notes and prior charts for additional history.   cardizem iv gtt.  Recheck hr 80, no chest pain.  Discussed w cardiology/Trish - they will see in ED.  Disposition per cardiology service.   Dr Julious Payer evaluated in ED - he requests cardizem 60 po in ED and d/c home with cardizem cd 240 a day.  Recheck pt, alert, no distress. Hr 78. No cp. No sob.   Will d/c per cardiology instructions.  Final Clinical Impressions(s) / ED Diagnoses   Final diagnoses:  None     New Prescriptions New Prescriptions   No medications on file         Cathren Laine, MD 10/05/16 [phone]

## 2016-10-05 NOTE — Discharge Instructions (Addendum)
It was our pleasure to provide your ER care today - we hope that you feel better.  Increase your cardizem to 240 mg one time a day - see new prescription.  Follow up with primary care doctor in the coming week.  Follow up with your cardiologist as discussed with them.  Return to ER right away if worse, new symptoms, fevers, trouble breathing, chest pain, persistent fast heart beat, weak/fainting, other concern.

## 2016-10-06 ENCOUNTER — Encounter (HOSPITAL_COMMUNITY): Payer: Self-pay | Admitting: *Deleted

## 2016-10-06 ENCOUNTER — Emergency Department (HOSPITAL_COMMUNITY): Payer: Medicare HMO

## 2016-10-06 ENCOUNTER — Inpatient Hospital Stay (HOSPITAL_COMMUNITY)
Admission: EM | Admit: 2016-10-06 | Discharge: 2016-10-09 | DRG: 312 | Disposition: A | Discharge status: Home / Self Care | Payer: Medicare HMO | Attending: Internal Medicine | Admitting: Internal Medicine

## 2016-10-06 DIAGNOSIS — Z7901 Long term (current) use of anticoagulants: Secondary | ICD-10-CM | POA: Diagnosis not present

## 2016-10-06 DIAGNOSIS — E669 Obesity, unspecified: Secondary | ICD-10-CM | POA: Diagnosis present

## 2016-10-06 DIAGNOSIS — Z79899 Other long term (current) drug therapy: Secondary | ICD-10-CM

## 2016-10-06 DIAGNOSIS — M810 Age-related osteoporosis without current pathological fracture: Secondary | ICD-10-CM | POA: Diagnosis present

## 2016-10-06 DIAGNOSIS — I110 Hypertensive heart disease with heart failure: Secondary | ICD-10-CM | POA: Diagnosis present

## 2016-10-06 DIAGNOSIS — I482 Chronic atrial fibrillation: Secondary | ICD-10-CM

## 2016-10-06 DIAGNOSIS — Z8673 Personal history of transient ischemic attack (TIA), and cerebral infarction without residual deficits: Secondary | ICD-10-CM

## 2016-10-06 DIAGNOSIS — K219 Gastro-esophageal reflux disease without esophagitis: Secondary | ICD-10-CM | POA: Diagnosis present

## 2016-10-06 DIAGNOSIS — Z950 Presence of cardiac pacemaker: Secondary | ICD-10-CM | POA: Diagnosis not present

## 2016-10-06 DIAGNOSIS — M1990 Unspecified osteoarthritis, unspecified site: Secondary | ICD-10-CM | POA: Diagnosis present

## 2016-10-06 DIAGNOSIS — Z6835 Body mass index (BMI) 35.0-35.9, adult: Secondary | ICD-10-CM

## 2016-10-06 DIAGNOSIS — I952 Hypotension due to drugs: Principal | ICD-10-CM | POA: Diagnosis present

## 2016-10-06 DIAGNOSIS — I480 Paroxysmal atrial fibrillation: Secondary | ICD-10-CM | POA: Diagnosis not present

## 2016-10-06 DIAGNOSIS — Z885 Allergy status to narcotic agent status: Secondary | ICD-10-CM | POA: Diagnosis not present

## 2016-10-06 DIAGNOSIS — Z888 Allergy status to other drugs, medicaments and biological substances status: Secondary | ICD-10-CM | POA: Diagnosis not present

## 2016-10-06 DIAGNOSIS — I5032 Chronic diastolic (congestive) heart failure: Secondary | ICD-10-CM | POA: Diagnosis present

## 2016-10-06 DIAGNOSIS — I2510 Atherosclerotic heart disease of native coronary artery without angina pectoris: Secondary | ICD-10-CM | POA: Diagnosis present

## 2016-10-06 DIAGNOSIS — I959 Hypotension, unspecified: Secondary | ICD-10-CM | POA: Diagnosis not present

## 2016-10-06 DIAGNOSIS — I130 Hypertensive heart and chronic kidney disease with heart failure and stage 1 through stage 4 chronic kidney disease, or unspecified chronic kidney disease: Secondary | ICD-10-CM | POA: Diagnosis not present

## 2016-10-06 DIAGNOSIS — N179 Acute kidney failure, unspecified: Secondary | ICD-10-CM | POA: Diagnosis present

## 2016-10-06 DIAGNOSIS — T461X5A Adverse effect of calcium-channel blockers, initial encounter: Secondary | ICD-10-CM | POA: Diagnosis present

## 2016-10-06 DIAGNOSIS — L03116 Cellulitis of left lower limb: Secondary | ICD-10-CM | POA: Diagnosis not present

## 2016-10-06 DIAGNOSIS — I361 Nonrheumatic tricuspid (valve) insufficiency: Secondary | ICD-10-CM | POA: Diagnosis not present

## 2016-10-06 DIAGNOSIS — N183 Chronic kidney disease, stage 3 (moderate): Secondary | ICD-10-CM | POA: Diagnosis not present

## 2016-10-06 DIAGNOSIS — I69344 Monoplegia of lower limb following cerebral infarction affecting left non-dominant side: Secondary | ICD-10-CM | POA: Diagnosis not present

## 2016-10-06 LAB — I-STAT CHEM 8, ED
BUN: 14 mg/dL (ref 6–20)
Calcium, Ion: 1.04 mmol/L — ABNORMAL LOW (ref 1.15–1.40)
Chloride: 106 mmol/L (ref 101–111)
Creatinine, Ser: 1.4 mg/dL — ABNORMAL HIGH (ref 0.44–1.00)
GLUCOSE: 126 mg/dL — AB (ref 65–99)
HCT: 44 % (ref 36.0–46.0)
HEMOGLOBIN: 15 g/dL (ref 12.0–15.0)
POTASSIUM: 3.7 mmol/L (ref 3.5–5.1)
Sodium: 142 mmol/L (ref 135–145)
TCO2: 24 mmol/L (ref 22–32)

## 2016-10-06 LAB — COMPREHENSIVE METABOLIC PANEL
ALBUMIN: 3 g/dL — AB (ref 3.5–5.0)
ALT: 13 U/L — ABNORMAL LOW (ref 14–54)
AST: 28 U/L (ref 15–41)
Alkaline Phosphatase: 102 U/L (ref 38–126)
Anion gap: 12 (ref 5–15)
BILIRUBIN TOTAL: 0.9 mg/dL (ref 0.3–1.2)
BUN: 9 mg/dL (ref 6–20)
CHLORIDE: 105 mmol/L (ref 101–111)
CO2: 21 mmol/L — ABNORMAL LOW (ref 22–32)
Calcium: 8.6 mg/dL — ABNORMAL LOW (ref 8.9–10.3)
Creatinine, Ser: 1.16 mg/dL — ABNORMAL HIGH (ref 0.44–1.00)
GFR calc Af Amer: 46 mL/min — ABNORMAL LOW (ref >60)
GFR calc non Af Amer: 40 mL/min — ABNORMAL LOW (ref >60)
GLUCOSE: 103 mg/dL — AB (ref 65–99)
POTASSIUM: 3.5 mmol/L (ref 3.5–5.1)
Sodium: 138 mmol/L (ref 135–145)
TOTAL PROTEIN: 7.1 g/dL (ref 6.5–8.1)

## 2016-10-06 LAB — CBC WITH DIFFERENTIAL/PLATELET
BASOS PCT: 0 %
Basophils Absolute: 0 10*3/uL (ref 0.0–0.1)
EOS ABS: 0.3 10*3/uL (ref 0.0–0.7)
Eosinophils Relative: 3 %
HCT: 42.2 % (ref 36.0–46.0)
Hemoglobin: 14.1 g/dL (ref 12.0–15.0)
Lymphocytes Relative: 32 %
Lymphs Abs: 2.9 10*3/uL (ref 0.7–4.0)
MCH: 33 pg (ref 26.0–34.0)
MCHC: 33.4 g/dL (ref 30.0–36.0)
MCV: 98.8 fL (ref 78.0–100.0)
MONO ABS: 1.3 10*3/uL — AB (ref 0.1–1.0)
MONOS PCT: 15 %
Neutro Abs: 4.4 10*3/uL (ref 1.7–7.7)
Neutrophils Relative %: 50 %
Platelets: 290 10*3/uL (ref 150–400)
RBC: 4.27 MIL/uL (ref 3.87–5.11)
RDW: 15.2 % (ref 11.5–15.5)
WBC: 8.9 10*3/uL (ref 4.0–10.5)

## 2016-10-06 LAB — I-STAT TROPONIN, ED: TROPONIN I, POC: 0 ng/mL (ref 0.00–0.08)

## 2016-10-06 LAB — I-STAT CG4 LACTIC ACID, ED: LACTIC ACID, VENOUS: 3.24 mmol/L — AB (ref 0.5–1.9)

## 2016-10-06 LAB — PROTIME-INR
INR: 2.87
PROTHROMBIN TIME: 29.8 s — AB (ref 11.4–15.2)

## 2016-10-06 MED ORDER — ONDANSETRON HCL 4 MG/2ML IJ SOLN
4.0000 mg | Freq: Four times a day (QID) | INTRAMUSCULAR | Status: DC | PRN
  Administered 2016-10-08: 4 mg via INTRAVENOUS
  Filled 2016-10-06: qty 2

## 2016-10-06 MED ORDER — PANTOPRAZOLE SODIUM 40 MG PO TBEC
40.0000 mg | DELAYED_RELEASE_TABLET | Freq: Every day | ORAL | Status: DC
  Administered 2016-10-07 – 2016-10-09 (×3): 40 mg via ORAL
  Filled 2016-10-06 (×3): qty 1

## 2016-10-06 MED ORDER — SODIUM CHLORIDE 0.9 % IV BOLUS (SEPSIS)
1000.0000 mL | Freq: Once | INTRAVENOUS | Status: AC
  Administered 2016-10-06: 1000 mL via INTRAVENOUS

## 2016-10-06 MED ORDER — PHENYLEPHRINE HCL 10 MG/ML IJ SOLN
0.0000 ug/min | Freq: Once | INTRAVENOUS | Status: AC
  Administered 2016-10-06: 10 ug/min via INTRAVENOUS
  Filled 2016-10-06: qty 1

## 2016-10-06 MED ORDER — SODIUM CHLORIDE 0.9 % IV SOLN
1.0000 g | Freq: Once | INTRAVENOUS | Status: DC
  Filled 2016-10-06: qty 10

## 2016-10-06 MED ORDER — WARFARIN - PHARMACIST DOSING INPATIENT: Freq: Every day | Status: DC

## 2016-10-06 MED ORDER — ACETAMINOPHEN 325 MG PO TABS
650.0000 mg | ORAL_TABLET | ORAL | Status: DC | PRN
  Administered 2016-10-09: 650 mg via ORAL
  Filled 2016-10-06: qty 2

## 2016-10-06 MED ORDER — CALCIUM GLUCONATE 10 % IV SOLN
INTRAVENOUS | Status: AC
  Administered 2016-10-06: 1000 mg
  Filled 2016-10-06: qty 10

## 2016-10-06 NOTE — ED Triage Notes (Signed)
Patient presents to ed via [facility] states she was seen in the ed yest for A-fib with RVR her medication was changed and she was told to increased her cardizem. Family states patient hasn't been herself today . Patient states she feels like she " died this morning"Patient is alert oriented

## 2016-10-06 NOTE — ED Notes (Signed)
Attempted report 

## 2016-10-06 NOTE — ED Provider Notes (Signed)
[facility]-EMERGENCY DEPT Provider Note   CSN: [id number] Arrival date & time: 10/06/16  1326     History   Chief Complaint Chief Complaint  Patient presents with  . Weakness    HPI [name] is a 83 y.o. female.  The history is provided by the patient and a relative. No language interpreter was used.  Weakness     [name] is a 83 y.o. female who presents to the Emergency Department complaining of weakness.  She presents for evaluation of weakness that started abruptly at 11:00 this morning. She states that she took her morning medications at 8 AM and about 11am she began to feel poorly with nausea, generalized malaise, headache and generalized weakness.She states that she felt like she died for a little while this morning. Yesterday she was seen in the emergency department for A. fib with RVR and she was discharged home feeling well but tired. She increased her Cardizem dose to 240 mg today. No fever, chest pain. She does have shortness of breath and this is chronic for her. Symptoms are severe and constant in nature.  Past Medical History:  Diagnosis Date  . Anticoagulation goal of INR 2 to 3 08/21/2015  . Arthritis    oa  . Balance problem   . Chronic diastolic CHF (congestive heart failure) (HCC)   . Dyspnea   . GERD (gastroesophageal reflux disease)   . Hyperlipidemia   . Hypertension   . Hypotensive episode 08/21/2015  . Mild CAD    a. nonobst CAD by cath 09/2010.  . Obesity   . Osteoporosis   . Pacemaker-St[employer] Jude   . Permanent atrial fibrillation (HCC)    a. c/b tachy-brady syndrome s/p PPM 2010.  [name] Stroke [facility]) 07/2013    Patient Active Problem List   Diagnosis Date Noted  . Hypotension 10/06/2016  . Sepsis (HCC) 09/06/2016  . Cellulitis 09/06/2016  . Atrial fibrillation with RVR (HCC) 09/06/2016  . Tremor 09/06/2016  . Anticoagulation goal of INR 2 to 3 08/21/2015  . Hypotensive episode 08/21/2015  . Pain in the chest   . Accelerated hypertension  08/18/2015  . Elevated troponin- for one draw- may be spurious 08/18/2015  . Vertigo 04/11/2014  . Chest pain 03/21/2014  . Viral syndrome 03/21/2014  . Nausea 03/21/2014  . Diarrhea 03/21/2014  . Pulmonary nodule 03/21/2014  . CVA (cerebral infarction) 08/21/2013  . TIA (transient ischemic attack) 08/21/2013  . Shortness of breath 04/10/2013  . Cardiac pacemaker-St[employer] Lauralee [name] 03/05/2013  . Chronic diastolic heart failure (HCC) 10/18/2011  . Chronic kidney disease 09/06/2010  . CVA 07/07/2009  . SICK SINUS SYNDROME 02/03/2009  . SHOULDER PAIN, LEFT, CHRONIC 02/03/2009  . INF&INFLAM REACT DUE CARD DEVICE IMPLANT&GRAFT 10/20/2008  . HYPERLIPIDEMIA-MIXED 06/04/2008  . Essential hypertension 06/04/2008  . Chronic atrial fibrillation (HCC) 06/04/2008    Past Surgical History:  Procedure Laterality Date  . ABDOMINAL HYSTERECTOMY    . BREAST LUMPECTOMY Right   . CARDIAC CATHETERIZATION    . COLECTOMY  1991  . INSERT / REPLACE / REMOVE PACEMAKER     [employer] Jude  . SPLENECTOMY    . TOTAL ABDOMINAL HYSTERECTOMY W/ BILATERAL SALPINGOOPHORECTOMY  1974    OB History    No data available       Home Medications    Prior to Admission medications   Medication Sig Start Date End Date Taking? Authorizing Provider  amiodarone (PACERONE) 200 MG tablet Take 1 tablet (200 mg total) by mouth daily.  05/03/16   Bensimhon, Bevelyn Buckles, MD  calcium carbonate (TUMS) 500 MG chewable tablet Chew 1 tablet by mouth daily as needed for indigestion or heartburn.    [provider]  carbidopa-levodopa (SINEMET CR) 50-200 MG tablet Take 1 tablet by mouth 2 (two) times daily. 08/10/15   [provider]  diltiazem (CARDIZEM LA) 240 MG 24 hr tablet Take 1 tablet (240 mg total) by mouth daily. 10/05/16   Cathren Laine, MD  furosemide (LASIX) 20 MG tablet Take 1 tablet (20 mg total) by mouth daily. 09/12/16   Little Ishikawa, NP  hydroxychloroquine (PLAQUENIL) 200 MG tablet Take 200 mg by mouth 2 (two)  times daily.    [provider]  metoprolol tartrate (LOPRESSOR) 50 MG tablet Take 25 mg by mouth 2 (two) times daily.     [provider]  omeprazole (PRILOSEC) 20 MG capsule Take 20 mg by mouth daily.      [provider]  rOPINIRole (REQUIP) 0.5 MG tablet Take 0.5 mg by mouth at bedtime.    [provider]  tiZANidine (ZANAFLEX) 2 MG tablet Take 2 mg by mouth daily. 08/02/16   [provider]  warfarin (COUMADIN) 3 MG tablet Take 1 tablet (3 mg total) by mouth daily. 09/10/16 09/10/17  Albertine Grates, MD    Family History Family History  Problem Relation Age of Onset  . Cancer Other   . Heart disease Other   . Hypertension Other     Social History Social History  Substance Use Topics  . Smoking status: Never Smoker  . Smokeless tobacco: Never Used  . Alcohol use Not on file     Allergies   Lipitor [atorvastatin] and Morphine and related   Review of Systems Review of Systems  Neurological: Positive for weakness.  All other systems reviewed and are negative.    Physical Exam Updated Vital Signs BP 98/60   Pulse 62   Temp (!) 97.3 F (36.3 C) (Oral)   Resp (!) 22   SpO2 95%   Physical Exam  Constitutional: She is oriented to person, place, and time. She appears well-developed and well-nourished.  HENT:  Head: Normocephalic and atraumatic.  Cardiovascular: Normal rate and regular rhythm.   No murmur heard. Pulmonary/Chest: Effort normal. No respiratory distress.  Decreased air movement in bilateral bases  Abdominal: Soft. There is no tenderness. There is no rebound and no guarding.  Musculoskeletal:  Nonpitting edema to bilateral lower extremities with venous stasis changes to bilateral lower extremities.  Neurological: She is alert and oriented to person, place, and time.  Generalized weakness. There is difficulty on strength testing in the left lower extremity but patient states this is secondary to pain (chronic).  Skin:  Skin is warm and dry.  Psychiatric: She has a normal mood and affect. Her behavior is normal.  Nursing note and vitals reviewed.    ED Treatments / Results  Labs (all labs ordered are listed, but only abnormal results are displayed) Labs Reviewed  I-STAT CHEM 8, ED - Abnormal; Notable for the following:       Result Value   Creatinine, Ser 1.40 (*)    Glucose, Bld 126 (*)    Calcium, Ion 1.04 (*)    All other components within normal limits  I-STAT CG4 LACTIC ACID, ED - Abnormal; Notable for the following:    Lactic Acid, Venous 3.24 (*)    All other components within normal limits  CBC WITH DIFFERENTIAL/PLATELET  PROTIME-INR  I-STAT  TROPONIN, ED  CBG MONITORING, ED    EKG  EKG Interpretation  Date/Time:  Thursday October 06 2016 13:34:07 EDT Ventricular Rate:  60 PR Interval:    QRS Duration: 126 QT Interval:  589 QTC Calculation: 589 R Axis:   -89 Text Interpretation:  VENTRICULAR PACED RHYTHM Confirmed by Tilden Fossa [phone]) on 10/06/2016 2:03:43 PM       Radiology Dg Chest Port 1 View  Result Date: 10/06/2016 CLINICAL DATA:  83 year old female with a history of atrial fibrillation EXAM: PORTABLE CHEST 1 VIEW COMPARISON:  10/05/2016 FINDINGS: Cardiomediastinal silhouette unchanged in size and contour. Double density over the lower mediastinum with air-fluid level, compatible with known large hernia. Opacities at the lung bases partially obscuring the hemidiaphragm. Coarsened interstitial markings, similar to prior. No significant interlobular septal thickening. No pneumothorax. Unchanged position of cardiac pacing device on the left chest wall. IMPRESSION: Low lung volumes with basilar opacities, potentially atelectasis/consolidation, with small pleural effusions not excluded. Large hiatal hernia again demonstrated. Electronically Signed   By: Gilmer Mor D.O.   On: 10/06/2016 14:19   Dg Chest Port 1 View  Result Date: 10/05/2016 CLINICAL DATA:  83 year old  female with shortness of breath. Chronic atrial fibrillation with subjective tachycardia recently. EXAM: PORTABLE CHEST 1 VIEW COMPARISON:  09/06/2016 and earlier. FINDINGS: Portable AP upright view at 1236 hours. Stable cardiomegaly and mediastinal contours. Chronic hiatal hernia. Stable left chest dual lead cardiac pacemaker. Pulmonary vascularity does appear mildly progressed, with basilar predominant congestion. Chronic increased AP dimension to the chest better demonstrated on the two view study 08/18/2015. No definite pleural effusions. No pneumothorax. Chronic retrocardiac atelectasis. No other confluent pulmonary opacity. IMPRESSION: 1. Increased basilar predominant pulmonary vascularity suspicious for mild or developing interstitial edema in this clinical setting. 2. No other acute cardiopulmonary abnormality. 3. Chronic hiatal hernia and mild cardiomegaly. Electronically Signed   By: Odessa Fleming M.D.   On: 10/05/2016 12:56    Procedures Procedures (including critical care time) CRITICAL CARE Performed by: Tilden Fossa   Total critical care time: 35 minutes  Critical care time was exclusive of separately billable procedures and treating other patients.  Critical care was necessary to treat or prevent imminent or life-threatening deterioration.  Critical care was time spent personally by me on the following activities: development of treatment plan with patient and/or surrogate as well as nursing, discussions with consultants, evaluation of patient's response to treatment, examination of patient, obtaining history from patient or surrogate, ordering and performing treatments and interventions, ordering and review of laboratory studies, ordering and review of radiographic studies, pulse oximetry and re-evaluation of patient's condition.  Medications Ordered in ED Medications  calcium gluconate 1 g in sodium chloride 0.9 % 100 mL IVPB (not administered)  calcium gluconate 10 % injection  (1,000 mg  Given 10/06/16 1408)  sodium chloride 0.9 % bolus 1,000 mL (0 mLs Intravenous Stopped 10/06/16 1621)  phenylephrine (NEO-SYNEPHRINE) 10 mg in dextrose 5 % 250 mL (0.04 mg/mL) infusion (10 mcg/min Intravenous New Bag/Given 10/06/16 1504)     Initial Impression / Assessment and Plan / ED Course  I have reviewed the triage vital signs and the nursing notes.  Pertinent labs & imaging results that were available during my care of the patient were reviewed by me and considered in my medical decision making (see chart for details).     Patient here for evaluation of weakness, malaise. She is hypotensive on evaluation with good mentation. EKG with paced rhythm, rate of 60. Concern for  possible toxicity secondary to medications. She was given calcium gluconate with no change in her symptoms. She was given IV fluids with no significant change in her hypotension. Neo-Synephrine was given and her blood pressure did improve to 100 systolic. Lactate is elevated and patient has a history of elevated lactate in the past. Current clinical presentation is not consistent with sepsis. Discussed with cardiology service. Plan to admit for further treatment.  Final Clinical Impressions(s) / ED Diagnoses   Final diagnoses:  Hypotension due to drugs    New Prescriptions New Prescriptions   No medications on file     Tilden Fossa, MD 10/07/16 [phone]

## 2016-10-06 NOTE — Progress Notes (Signed)
ANTICOAGULATION CONSULT NOTE - Initial Consult  Pharmacy Consult for warfarin Indication: atrial fibrillation  Allergies  Allergen Reactions  . Lipitor [Atorvastatin] Other (See Comments)    Myalgias (noted in neurologist note on 08/23/2013 when admitted for acute CVA).  08/20/15:  Pravastatin started at that time. However, patient is not taking Pravastatin/told okay to stop it by PCP. [facility] said it was last filled in 07/2013)   . Morphine And Related Other (See Comments)    hallucinations    Patient Measurements: Height: 5\' 1"  (154.9 cm) Weight: 186 lb 8.2 oz (84.6 kg) IBW/kg (Calculated) : 47.8   Vital Signs: Temp: 98 F (36.7 C) (09/06 2025) Temp Source: Oral (09/06 2025) BP: 132/76 (09/06 2030) Pulse Rate: 68 (09/06 2000)  Labs:  Recent Labs  10/05/16 1019 10/06/16 1407  HGB 15.6* 15.0  HCT 46.3* 44.0  PLT 321  --   LABPROT 28.0*  --   INR 2.64  --   CREATININE 1.16* 1.40*    Estimated Creatinine Clearance: 25.8 mL/min (A) (by C-G formula based on SCr of 1.4 mg/dL (H)).   Medical History: Past Medical History:  Diagnosis Date  . Anticoagulation goal of INR 2 to 3 08/21/2015  . Arthritis    oa  . Balance problem   . Chronic diastolic CHF (congestive heart failure) (HCC)   . Dyspnea   . GERD (gastroesophageal reflux disease)   . Hyperlipidemia   . Hypertension   . Hypotensive episode 08/21/2015  . Mild CAD    a. nonobst CAD by cath 09/2010.  . Obesity   . Osteoporosis   . Pacemaker-St. Jude   . Permanent atrial fibrillation (HCC)    a. c/b tachy-brady syndrome s/p PPM 2010.  2011 Stroke [facility]) 07/2013    Assessment: 83 year old female on chronic coumadin for afib. INR 2.6 yesterday prior to being sent home from the ED. INR pending for today 9/6. Warfarin already taken per patient today. Will follow up INR in am and continue home warfarin as appropriate.  Goal of Therapy:  INR 2-3 Monitor platelets by anticoagulation protocol: Yes   Plan:   Coumadin taken prior to admit Daily INR for now  11/6 PharmD., BCPS Clinical Pharmacist Pager [phone] 10/06/2016 8:58 PM

## 2016-10-06 NOTE — H&P (Signed)
History & Physical    Patient ID: [name] MRN: [id number], DOB/AGE: Oct 16, 1923   Admit date: 10/06/2016   Primary Physician: Charlott Rakes, MD Primary Cardiologist: Bensimhon  Patient Profile    83 yo female with PMH of diastolic HF, chronic AFib complicated by tachy/brady syndrome s/p PPM 2010, chronic dyspnea, HTN and GERD who presented with weakness and hypotension.   Past Medical History   Past Medical History:  Diagnosis Date  . Anticoagulation goal of INR 2 to 3 08/21/2015  . Arthritis    oa  . Balance problem   . Chronic diastolic CHF (congestive heart failure) (HCC)   . Dyspnea   . GERD (gastroesophageal reflux disease)   . Hyperlipidemia   . Hypertension   . Hypotensive episode 08/21/2015  . Mild CAD    a. nonobst CAD by cath 09/2010.  . Obesity   . Osteoporosis   . Pacemaker-St. Jude   . Permanent atrial fibrillation (HCC)    a. c/b tachy-brady syndrome s/p PPM 2010.  [name] Stroke [facility]) 07/2013    Past Surgical History:  Procedure Laterality Date  . ABDOMINAL HYSTERECTOMY    . BREAST LUMPECTOMY Right   . CARDIAC CATHETERIZATION    . COLECTOMY  1991  . INSERT / REPLACE / REMOVE PACEMAKER     St Jude  . SPLENECTOMY    . TOTAL ABDOMINAL HYSTERECTOMY W/ BILATERAL SALPINGOOPHORECTOMY  1974     Allergies  Allergies  Allergen Reactions  . Lipitor [Atorvastatin] Other (See Comments)    Myalgias (noted in neurologist note on 08/23/2013 when admitted for acute CVA).  08/20/15:  Pravastatin started at that time. However, patient is not taking Pravastatin/told okay to stop it by PCP. [facility] said it was last filled in 07/2013)   . Morphine And Related Other (See Comments)    hallucinations    History of Present Illness    [name] is a 83 yo female with PMH noted above who is followed by Dr. Gala Romney as an outpatient. Was admitted 8/7-8/11 with LLE cellulitis and given IV fluids then required diuresis. Was discharged at weight of 183lbs. She  presented to the ED yesterday with palpitations and found to be in Afib RVR with rates in the 140s. She had not received her morning medications prior to coming to the ED. HH RN had come to check on her related to LE cellulitis dx several weeks prior. Noted that her HR was elevated after she had walked back from the bathroom and had uneasiness in her chest. RN called EMS, and she was brought to the ED.   She was given Cardizem IV via EMS prior to arrival and started on Dilt gtt in the ED. Cardiology consulted and noted her HR in the 130-140s. Improved on Dilt gtt into the 90s. Not felt to be in HF on exam/CXR. She was given 60mg  short-acting Cardizem with drip stopped. Home Dilt was increased from 120mg  daily to 240mg  daily. Continued on home coumadin.   Reports going home and feeling fine. This morning took her increased dose of Dilt and shortly afterwards felt awful. Felt like she was " going out". Dizzy and lightheaded. Brought to the ED via EMS. EKG showed vpaced rhythm, and was hypotensive with systolic pressures in the 70s. Given calcium gluconate in the ED, and started on IVFs, and neo-synephrine.  ECHO 07/2009: EF 55-60%, mild MR, LA mod dilated, RA mild dilated ECHO 10/14: EF 55-60%, biatrial enlargement, normal RV size/systolic function, PA systolic  pressure 39 mmHg.  ECHO 7/17:  EF 55-60% RVSP 39 biatrial enlargement.   Home Medications    Prior to Admission medications   Medication Sig Start Date End Date Taking? Authorizing Provider  amiodarone (PACERONE) 200 MG tablet Take 1 tablet (200 mg total) by mouth daily. 05/03/16   Bensimhon, Bevelyn Buckles, MD  calcium carbonate (TUMS) 500 MG chewable tablet Chew 1 tablet by mouth daily as needed for indigestion or heartburn.    [provider]  carbidopa-levodopa (SINEMET CR) 50-200 MG tablet Take 1 tablet by mouth 2 (two) times daily. 08/10/15   [provider]  diltiazem (CARDIZEM LA) 240 MG 24 hr tablet Take 1 tablet (240 mg total)  by mouth daily. 10/05/16   Cathren Laine, MD  furosemide (LASIX) 20 MG tablet Take 1 tablet (20 mg total) by mouth daily. 09/12/16   Little Ishikawa, NP  hydroxychloroquine (PLAQUENIL) 200 MG tablet Take 200 mg by mouth 2 (two) times daily.    [provider]  metoprolol tartrate (LOPRESSOR) 50 MG tablet Take 25 mg by mouth 2 (two) times daily.     [provider]  omeprazole (PRILOSEC) 20 MG capsule Take 20 mg by mouth daily.      [provider]  rOPINIRole (REQUIP) 0.5 MG tablet Take 0.5 mg by mouth at bedtime.    [provider]  tiZANidine (ZANAFLEX) 2 MG tablet Take 2 mg by mouth daily. 08/02/16   [provider]  warfarin (COUMADIN) 3 MG tablet Take 1 tablet (3 mg total) by mouth daily. 09/10/16 09/10/17  Albertine Grates, MD    Family History     Family History  Problem Relation Age of Onset  . Cancer Other   . Heart disease Other   . Hypertension Other     Social History    Social History   Social History  . Marital status: Widowed    Spouse name: N/A  . Number of children: N/A  . Years of education: N/A   Occupational History  . Not on file.   Social History Main Topics  . Smoking status: Never Smoker  . Smokeless tobacco: Never Used  . Alcohol use Not on file  . Drug use: Unknown  . Sexual activity: Not on file   Other Topics Concern  . Not on file   Social History Narrative  . No narrative on file     Review of Systems    See HPI  All other systems reviewed and are otherwise negative except as noted above.  Physical Exam    Blood pressure (!) 83/56, pulse 60, temperature (!) 97.3 F (36.3 C), temperature source Oral, resp. rate 18, SpO2 93 %.  General: Pleasant, older WF NAD.  Psych: Normal affect. Neuro: Alert and oriented X 3. Moves all extremities spontaneously. HEENT: Normal  Neck: Supple without bruits or JVD. Lungs:  Resp regular and unlabored, CTA. Heart: Irreg Irreg, no s3, s4, or murmurs. Abdomen: Soft,  non-tender, non-distended, BS + x 4.  Extremities: No clubbing, cyanosis or edema. DP/PT/Radials 2+ and equal bilaterally.  Labs    Troponin [facility] Test)  Recent Labs  10/06/16 1405  TROPIPOC 0.00   No results for input(s): CKTOTAL, CKMB, TROPONINI in the last 72 hours. Lab Results  Component Value Date   WBC 12.6 (H) 10/05/2016   HGB 15.0 10/06/2016   HCT 44.0 10/06/2016   MCV 97.5 10/05/2016   PLT 321 10/05/2016     Recent Labs Lab  10/05/16 1019 10/06/16 1407  NA 138 142  K 3.5 3.7  CL 105 106  CO2 21*  --   BUN 9 14  CREATININE 1.16* 1.40*  CALCIUM 8.6*  --   PROT 7.1  --   BILITOT 0.9  --   ALKPHOS 102  --   ALT 13*  --   AST 28  --   GLUCOSE 103* 126*   Lab Results  Component Value Date   CHOL 207 (H) 08/22/2013   HDL 43 08/22/2013   LDLCALC 124 (H) 08/22/2013   TRIG 199 (H) 08/22/2013   Lab Results  Component Value Date   DDIMER 0.31 02/21/2012     Radiology Studies    Dg Tibia/fibula Left  Result Date: 09/06/2016 CLINICAL DATA:  Acute onset of left lower leg pain, erythema and swelling. Initial encounter. EXAM: LEFT TIBIA AND FIBULA - 2 VIEW COMPARISON:  Left knee radiographs performed 07/06/2014 FINDINGS: There is no evidence of fracture or dislocation. The left tibia and fibula appear intact. Mild diffuse joint space narrowing is noted at the knee, with prominent subcortical cysts at the medial tibial plateau. No significant knee joint effusion is identified. Medial soft tissue swelling is noted about the lower leg. The ankle mortise appears grossly intact. IMPRESSION: 1. No evidence of fracture or dislocation. 2. Tricompartmental osteoarthritis at the left knee, most prominent at the medial compartment. Electronically Signed   By: Roanna Raider M.D.   On: 09/06/2016 21:33   Dg Chest Port 1 View  Result Date: 10/06/2016 CLINICAL DATA:  83 year old female with a history of atrial fibrillation EXAM: PORTABLE CHEST 1 VIEW COMPARISON:   10/05/2016 FINDINGS: Cardiomediastinal silhouette unchanged in size and contour. Double density over the lower mediastinum with air-fluid level, compatible with known large hernia. Opacities at the lung bases partially obscuring the hemidiaphragm. Coarsened interstitial markings, similar to prior. No significant interlobular septal thickening. No pneumothorax. Unchanged position of cardiac pacing device on the left chest wall. IMPRESSION: Low lung volumes with basilar opacities, potentially atelectasis/consolidation, with small pleural effusions not excluded. Large hiatal hernia again demonstrated. Electronically Signed   By: Gilmer Mor D.O.   On: 10/06/2016 14:19   Dg Chest Port 1 View  Result Date: 10/05/2016 CLINICAL DATA:  83 year old female with shortness of breath. Chronic atrial fibrillation with subjective tachycardia recently. EXAM: PORTABLE CHEST 1 VIEW COMPARISON:  09/06/2016 and earlier. FINDINGS: Portable AP upright view at 1236 hours. Stable cardiomegaly and mediastinal contours. Chronic hiatal hernia. Stable left chest dual lead cardiac pacemaker. Pulmonary vascularity does appear mildly progressed, with basilar predominant congestion. Chronic increased AP dimension to the chest better demonstrated on the two view study 08/18/2015. No definite pleural effusions. No pneumothorax. Chronic retrocardiac atelectasis. No other confluent pulmonary opacity. IMPRESSION: 1. Increased basilar predominant pulmonary vascularity suspicious for mild or developing interstitial edema in this clinical setting. 2. No other acute cardiopulmonary abnormality. 3. Chronic hiatal hernia and mild cardiomegaly. Electronically Signed   By: Odessa Fleming M.D.   On: 10/05/2016 12:56   Dg Chest Port 1 View  Result Date: 09/06/2016 CLINICAL DATA:  Cellulitis, left-sided sharp chest pain yesterday. Chronic dyspnea. EXAM: PORTABLE CHEST 1 VIEW COMPARISON:  None. FINDINGS: A large hiatal hernia projects over the cardiac silhouette.  There is aortic atherosclerosis at the arch with uncoiling of the thoracic aorta. The heart is enlarged but stable. There is mild interstitial edema. Probable tiny pleural effusions bilaterally. Patient is rotated on current exam. Left-sided pacemaker apparatus projects over the periphery  of the left hemithorax with right atrial and right ventricular leads identified. No acute nor suspicious osseous abnormality. Degenerative changes are seen along dorsal spine. IMPRESSION: 1. Stable cardiomegaly and aortic atherosclerosis. 2. Mild interstitial edema with trace bilateral pleural effusions. 3. Large hiatal hernia. Electronically Signed   By: Tollie Eth M.D.   On: 09/06/2016 18:35    ECG & Cardiac Imaging    Echo: 7/17  Study Conclusions  - Left ventricle: The cavity size was normal. Wall thickness was   normal. Systolic function was normal. The estimated ejection   fraction was in the range of 55% to 60%. Images were inadequate   for LV wall motion assessment. The study is not technically   sufficient to allow evaluation of LV diastolic function. - Aortic valve: Sclerosis without stenosis. There was trivial   regurgitation. - Mitral valve: Mildly thickened leaflets . There was mild   regurgitation. - Left atrium: Moderately dilated. - Right ventricle: Poorly visualized. - Right atrium: Moderately dilated. - Tricuspid valve: There was mild regurgitation. - Pulmonary arteries: PA peak pressure: 39 mm Hg (S). - Inferior vena cava: The vessel was dilated. The respirophasic   diameter changes were blunted (< 50%), consistent with elevated   central venous pressure.  Impressions:  - Compared to a prior echo in 07/2013, there are no significant   changes.  Assessment & Plan    1. Chronic Afib: Home Dilt dose was increased from 120mg  daily to 240mg  daily after visit to the ED yesterday with elevated HR. Took first dose this morning and shortly afterwards felt lightheaded, and dizzy. Noted  to be hypotensive with systolic pressures in the 70s.  -- hold home diltiazem, lopressor and amiodarone  -- coumadin per pharmacy  2. Hypotension: suspect 2/2 to increase in home Dilt dose. Holding home medications at this time.  -- neo-synephrine started in the ED with improvement. Maintain MAP >65  3. Chronic diastolic HF: Weight stable, no signs of volume overload on exam.  -- hold home lasix given hypotension -- given IVFs in the ED, will need to monitor volume status closely   4. AKI: Cr 1.4 suspect 2/2 to hypotension.  -- bmet in the am  5. HTN: holding antihypertensives at this time  6. Hx of CVA: on coumadin-per pharmacy  7. Elevated Lactic Acid: no signs of infection. Follow up in the am  Signed, , NP-C Pager [phone] 10/06/2016, 3:33 PM   Patient seen and examined with the above-signed Advanced Practice Provider and/or Housestaff. I personally reviewed laboratory data, imaging studies and relevant notes. I independently examined the patient and formulated the important aspects of the plan. I have edited the note to reflect any of my changes or salient points. I have personally discussed the plan with the patient and/or family.  83 y/o woman as above with chronic AF and tachy-brady s/p PPM. Seen in ER yesterday for elevated AF rates. Caridzem increased from 120 to 240 daily. Back today with profound hypotension. HR now paced. Likely too much Ca-channel blockade. BP improved with IVF and neosynephrine. Will wean neo as tolerated. Watch renal function closely. Suspect it may get worse. May need to stop diltiazem long-term and use amio for rate control.   12/06/2016, MD  5:13 PM

## 2016-10-07 ENCOUNTER — Inpatient Hospital Stay (HOSPITAL_COMMUNITY): Payer: Medicare HMO

## 2016-10-07 DIAGNOSIS — I361 Nonrheumatic tricuspid (valve) insufficiency: Secondary | ICD-10-CM

## 2016-10-07 DIAGNOSIS — I952 Hypotension due to drugs: Principal | ICD-10-CM

## 2016-10-07 LAB — BASIC METABOLIC PANEL
ANION GAP: 9 (ref 5–15)
BUN: 12 mg/dL (ref 6–20)
CALCIUM: 8.2 mg/dL — AB (ref 8.9–10.3)
CHLORIDE: 111 mmol/L (ref 101–111)
CO2: 20 mmol/L — AB (ref 22–32)
Creatinine, Ser: 1.25 mg/dL — ABNORMAL HIGH (ref 0.44–1.00)
GFR calc Af Amer: 42 mL/min — ABNORMAL LOW (ref >60)
GFR, EST NON AFRICAN AMERICAN: 36 mL/min — AB (ref >60)
Glucose, Bld: 98 mg/dL (ref 65–99)
POTASSIUM: 3.5 mmol/L (ref 3.5–5.1)
Sodium: 140 mmol/L (ref 135–145)

## 2016-10-07 LAB — ECHOCARDIOGRAM COMPLETE
HEIGHTINCHES: 61 in
WEIGHTICAEL: 2969.6 [oz_av]

## 2016-10-07 LAB — CBC
HCT: 43.5 % (ref 36.0–46.0)
HEMOGLOBIN: 14.2 g/dL (ref 12.0–15.0)
MCH: 31.9 pg (ref 26.0–34.0)
MCHC: 32.6 g/dL (ref 30.0–36.0)
MCV: 97.8 fL (ref 78.0–100.0)
Platelets: 297 10*3/uL (ref 150–400)
RBC: 4.45 MIL/uL (ref 3.87–5.11)
RDW: 15 % (ref 11.5–15.5)
WBC: 10.1 10*3/uL (ref 4.0–10.5)

## 2016-10-07 LAB — PROTIME-INR
INR: 2.92
PROTHROMBIN TIME: 30.2 s — AB (ref 11.4–15.2)

## 2016-10-07 LAB — LACTIC ACID, PLASMA: LACTIC ACID, VENOUS: 1.7 mmol/L (ref 0.5–1.9)

## 2016-10-07 MED ORDER — ROPINIROLE HCL 0.5 MG PO TABS
0.5000 mg | ORAL_TABLET | Freq: Every day | ORAL | Status: DC
  Administered 2016-10-07 – 2016-10-08 (×2): 0.5 mg via ORAL
  Filled 2016-10-07 (×2): qty 1

## 2016-10-07 MED ORDER — AMIODARONE HCL 200 MG PO TABS
200.0000 mg | ORAL_TABLET | Freq: Two times a day (BID) | ORAL | Status: DC
  Administered 2016-10-07 – 2016-10-08 (×3): 200 mg via ORAL
  Filled 2016-10-07 (×3): qty 1

## 2016-10-07 MED ORDER — WARFARIN 0.5 MG HALF TABLET
1.5000 mg | ORAL_TABLET | Freq: Once | ORAL | Status: AC
  Administered 2016-10-07: 1.5 mg via ORAL
  Filled 2016-10-07: qty 0.5

## 2016-10-07 MED ORDER — HYDROXYCHLOROQUINE SULFATE 200 MG PO TABS
200.0000 mg | ORAL_TABLET | Freq: Two times a day (BID) | ORAL | Status: DC
  Administered 2016-10-07 – 2016-10-09 (×5): 200 mg via ORAL
  Filled 2016-10-07 (×6): qty 1

## 2016-10-07 MED ORDER — CARBIDOPA-LEVODOPA ER 50-200 MG PO TBCR
1.0000 | EXTENDED_RELEASE_TABLET | Freq: Two times a day (BID) | ORAL | Status: DC
  Administered 2016-10-07 – 2016-10-09 (×5): 1 via ORAL
  Filled 2016-10-07 (×6): qty 1

## 2016-10-07 NOTE — Progress Notes (Signed)
Report called to receiving RN [phone]. Patient with no complaints at the current time. Will transfer via WC.

## 2016-10-07 NOTE — Progress Notes (Signed)
Advanced Heart Failure Rounding Note  PCP:  Primary Cardiologist: Dr Gala Romney  Subjective:    Admitted with hypotension in the setting of increased diltiazem on 9/5. Diltiazem stopped. Briefly on neo now off. SBP improved. Received IV lasix in the ER   Today she feeling better. Denies SOB.     Objective:   Weight Range: 185 lb 9.6 oz (84.2 kg) Body mass index is 35.07 kg/m.   Vital Signs:   Temp:  [97.3 F (36.3 C)-98 F (36.7 C)] 97.6 F (36.4 C) (09/06 2356) Pulse Rate:  [34-68] 68 (09/06 2000) Resp:  [16-31] 19 (09/07 0700) BP: (70-142)/(49-99) 135/89 (09/07 0700) SpO2:  [87 %-99 %] 93 % (09/07 0700) Weight:  [185 lb 9.6 oz (84.2 kg)-186 lb 8.2 oz (84.6 kg)] 185 lb 9.6 oz (84.2 kg) (09/07 0300) Last BM Date: 10/06/16  Weight change: Filed Weights   10/06/16 2025 10/07/16 0300  Weight: 186 lb 8.2 oz (84.6 kg) 185 lb 9.6 oz (84.2 kg)    Intake/Output:   Intake/Output Summary (Last 24 hours) at 10/07/16 0839 Last data filed at 10/07/16 0300  Gross per 24 hour  Intake           442.75 ml  Output              250 ml  Net           192.75 ml      Physical Exam    General:  Tremors. Well appearing. No resp difficulty. Sitting in the chair.  HEENT: Normal Neck: Supple. JVP 6-7 . Carotids 2+ bilat; no bruits. No lymphadenopathy or thyromegaly appreciated. Cor: PMI nondisplaced. Irregular rate & rhythm. No rubs, gallops or murmurs. Lungs: Clear Abdomen: Soft, nontender, nondistended. No hepatosplenomegaly. No bruits or masses. Good bowel sounds. Extremities: No cyanosis, clubbing, rash, edema Neuro: Alert & orientedx3, cranial nerves grossly intact. moves all 4 extremities w/o difficulty. Affect pleasant   Telemetry   A fib 80s   EKG    A fib 60 bpm   Labs    CBC  Recent Labs  10/06/16 2208 10/07/16 0330  WBC 8.9 10.1  NEUTROABS 4.4  --   HGB 14.1 14.2  HCT 42.2 43.5  MCV 98.8 97.8  PLT 290 297   Basic Metabolic Panel  Recent Labs  10/05/16 1019 10/06/16 1407 10/07/16 0330  NA 138 142 140  K 3.5 3.7 3.5  CL 105 106 111  CO2 21*  --  20*  GLUCOSE 103* 126* 98  BUN 9 14 12   CREATININE 1.16* 1.40* 1.25*  CALCIUM 8.6*  --  8.2*   Liver Function Tests  Recent Labs  10/05/16 1019  AST 28  ALT 13*  ALKPHOS 102  BILITOT 0.9  PROT 7.1  ALBUMIN 3.0*   No results for input(s): LIPASE, AMYLASE in the last 72 hours. Cardiac Enzymes No results for input(s): CKTOTAL, CKMB, CKMBINDEX, TROPONINI in the last 72 hours.  BNP: BNP (last 3 results)  Recent Labs  07/13/16 1515  BNP 367.4*    ProBNP (last 3 results) No results for input(s): PROBNP in the last 8760 hours.   D-Dimer No results for input(s): DDIMER in the last 72 hours. Hemoglobin A1C No results for input(s): HGBA1C in the last 72 hours. Fasting Lipid Panel No results for input(s): CHOL, HDL, LDLCALC, TRIG, CHOLHDL, LDLDIRECT in the last 72 hours. Thyroid Function Tests No results for input(s): TSH, T4TOTAL, T3FREE, THYROIDAB in the last 72 hours.  Invalid input(s):  FREET3  Other results:   Imaging    Dg Chest Port 1 View  Result Date: 10/06/2016 CLINICAL DATA:  83 year old female with a history of atrial fibrillation EXAM: PORTABLE CHEST 1 VIEW COMPARISON:  10/05/2016 FINDINGS: Cardiomediastinal silhouette unchanged in size and contour. Double density over the lower mediastinum with air-fluid level, compatible with known large hernia. Opacities at the lung bases partially obscuring the hemidiaphragm. Coarsened interstitial markings, similar to prior. No significant interlobular septal thickening. No pneumothorax. Unchanged position of cardiac pacing device on the left chest wall. IMPRESSION: Low lung volumes with basilar opacities, potentially atelectasis/consolidation, with small pleural effusions not excluded. Large hiatal hernia again demonstrated. Electronically Signed   By: Gilmer Mor D.O.   On: 10/06/2016 14:19      Medications:      Scheduled Medications: . pantoprazole  40 mg Oral Daily  . Warfarin - Pharmacist Dosing Inpatient   Does not apply q1800     Infusions: . calcium gluconate 1 GM IV       PRN Medications:  acetaminophen, ondansetron (ZOFRAN) IV    Patient Profile   83 y/o woman as above with chronic AF and tachy-brady s/p PPM. Seen in ER on 9/5  for elevated AF rates-->. Caridzem increased from 120 to 240 daily.   Admitted with profound hypotension.  Assessment/Plan   1. Hypotension - in the setting increased diltiazem. Briefly on neo.  Resolved. BP improved today. Running high. Keep off diltiazem. Add low dose lopressor 12.5 mg twice a dya.  2. A fib - rate controlled.  Currently off diltiazem.  PTA she was taking diltiazem, lopressor, and amio.  Add back low dose lopressor 12.5 mg twice a day.  INR 2.9. On coumadin. Pharmacy managing INR.  3. Chronic Diastolic Heart Failure  Volume status stable. Restart lasix 20 mg po lasix tomorrow.  Renal function stable. Creatinine down.  4. AKI- 2/2 to hypotension. Creatinine down to 1.2 today.  5. Elevated Lactic Acid- Trending down 3.2>1.7. No S/S of infection.  6. H/O Tachy/Brady with PPM 2010  D/C tomorrow. Repeat ECHO today. .  Length of Stay: 1   Amy Clegg, NP  10/07/2016, 8:39 AM  Advanced Heart Failure Team Pager [phone] (M-F; 7a - 4p)  Please contact [facility] for night-coverage after hours (4p -7a ) and weekends on [email or web address]  Patient seen and examined with Tonye Becket, NP. We discussed all aspects of the encounter. I agree with the assessment and plan as stated above.   She is off vasopressors. HR climbing back up. Echo reviewed personally. EF 55-60% RV ok. Suspect shock was due to CCB.  Will increase amio to 200 bid to help with AF rate control. Metoprolol resumed. Can move to telemetry   Lactate down. Creatinine stable.   Arvilla Meres, MD  3:02 PM

## 2016-10-07 NOTE — Progress Notes (Signed)
  Echocardiogram 2D Echocardiogram has been performed.  [name] M 10/07/2016, 1:14 PM

## 2016-10-07 NOTE — Progress Notes (Signed)
ANTICOAGULATION CONSULT NOTE - Follow Up Consult  Pharmacy Consult for Coumadin Indication: atrial fibrillation  Allergies  Allergen Reactions  . Lipitor [Atorvastatin] Other (See Comments)    Myalgias (noted in neurologist note on 08/23/2013 when admitted for acute CVA).  08/20/15:  Pravastatin started at that time. However, patient is not taking Pravastatin/told okay to stop it by PCP. [facility] said it was last filled in 07/2013)   . Morphine And Related Other (See Comments)    hallucinations    Patient Measurements: Height: 5\' 1"  (154.9 cm) Weight: 185 lb 9.6 oz (84.2 kg) IBW/kg (Calculated) : 47.8  Vital Signs: Temp: 97.6 F (36.4 C) (09/06 2356) Temp Source: Oral (09/06 2356) BP: 135/89 (09/07 0700)  Labs:  Recent Labs  10/05/16 1019 10/06/16 1407 10/06/16 2208 10/07/16 0330  HGB 15.6* 15.0 14.1 14.2  HCT 46.3* 44.0 42.2 43.5  PLT 321  --  290 297  LABPROT 28.0*  --  29.8* 30.2*  INR 2.64  --  2.87 2.92  CREATININE 1.16* 1.40*  --  1.25*    Estimated Creatinine Clearance: 28.9 mL/min (A) (by C-G formula based on SCr of 1.25 mg/dL (H)).  Assessment: [name] continues on coumadin for afib. INR therapeutic at 2.92 and trending up. Amiodarone increased to 200mg  bid (from 200mg  daily at home) so will empirically decrease tonight's coumadin dose. CBC stable.   PTA dose: 3mg  daily  Goal of Therapy:  INR 2-3 Monitor platelets by anticoagulation protocol: Yes   Plan:  1) Coumadin 1.5mg  tonight 2) Daily INR  12/07/16 10/07/2016,11:26 AM

## 2016-10-07 NOTE — Progress Notes (Signed)
Patient heart rate when ambulating to bathroom went up to 160 (per CCMD), quickly went down to 139 upon RN assessment. Will continue to monitor.

## 2016-10-08 DIAGNOSIS — I5032 Chronic diastolic (congestive) heart failure: Secondary | ICD-10-CM

## 2016-10-08 LAB — PROTIME-INR
INR: 2.64
PROTHROMBIN TIME: 28 s — AB (ref 11.4–15.2)

## 2016-10-08 LAB — BASIC METABOLIC PANEL
ANION GAP: 9 (ref 5–15)
BUN: 12 mg/dL (ref 6–20)
CALCIUM: 8.4 mg/dL — AB (ref 8.9–10.3)
CO2: 23 mmol/L (ref 22–32)
Chloride: 108 mmol/L (ref 101–111)
Creatinine, Ser: 1.24 mg/dL — ABNORMAL HIGH (ref 0.44–1.00)
GFR calc Af Amer: 43 mL/min — ABNORMAL LOW (ref >60)
GFR, EST NON AFRICAN AMERICAN: 37 mL/min — AB (ref >60)
GLUCOSE: 107 mg/dL — AB (ref 65–99)
Potassium: 4.2 mmol/L (ref 3.5–5.1)
SODIUM: 140 mmol/L (ref 135–145)

## 2016-10-08 MED ORDER — FUROSEMIDE 20 MG PO TABS
20.0000 mg | ORAL_TABLET | Freq: Every day | ORAL | Status: DC
  Administered 2016-10-08 – 2016-10-09 (×2): 20 mg via ORAL
  Filled 2016-10-08 (×2): qty 1

## 2016-10-08 MED ORDER — METOPROLOL TARTRATE 25 MG PO TABS
25.0000 mg | ORAL_TABLET | Freq: Two times a day (BID) | ORAL | Status: DC
  Administered 2016-10-08 – 2016-10-09 (×3): 25 mg via ORAL
  Filled 2016-10-08 (×3): qty 1

## 2016-10-08 MED ORDER — WARFARIN SODIUM 3 MG PO TABS
1.5000 mg | ORAL_TABLET | Freq: Once | ORAL | Status: AC
  Administered 2016-10-08: 1.5 mg via ORAL

## 2016-10-08 MED ORDER — AMIODARONE HCL 200 MG PO TABS
400.0000 mg | ORAL_TABLET | Freq: Two times a day (BID) | ORAL | Status: DC
  Administered 2016-10-08 – 2016-10-09 (×2): 400 mg via ORAL
  Filled 2016-10-08 (×2): qty 2

## 2016-10-08 NOTE — Progress Notes (Signed)
ANTICOAGULATION CONSULT NOTE - Follow Up Consult  Pharmacy Consult for Coumadin Indication: atrial fibrillation  Allergies  Allergen Reactions  . Lipitor [Atorvastatin] Other (See Comments)    Myalgias (noted in neurologist note on 08/23/2013 when admitted for acute CVA).  08/20/15:  Pravastatin started at that time. However, patient is not taking Pravastatin/told okay to stop it by PCP. [facility] said it was last filled in 07/2013)   . Morphine And Related Other (See Comments)    hallucinations    Patient Measurements: Height: 5\' 1"  (154.9 cm) Weight: 184 lb 8 oz (83.7 kg) IBW/kg (Calculated) : 47.8  Vital Signs: Temp: 97.7 F (36.5 C) (09/08 0534) Temp Source: Oral (09/08 0534) BP: 136/97 (09/08 1135) Pulse Rate: 99 (09/08 1135)  Labs:  Recent Labs  10/06/16 1407 10/06/16 2208 10/07/16 0330 10/08/16 0323  HGB 15.0 14.1 14.2  --   HCT 44.0 42.2 43.5  --   PLT  --  290 297  --   LABPROT  --  29.8* 30.2* 28.0*  INR  --  2.87 2.92 2.64  CREATININE 1.40*  --  1.25* 1.24*    Estimated Creatinine Clearance: 29 mL/min (A) (by C-G formula based on SCr of 1.24 mg/dL (H)).  Assessment: 89yof continues on coumadin for afib. INR therapeutic at 2.64 and trending down a bit. Amiodarone to be increased further today to 400mg  bid (from 200mg  daily at home) so will continue empirically decreased coumadin dose tonight. CBC stable. No bleeding documented.  PTA dose: 3mg  daily  Goal of Therapy:  INR 2-3 Monitor platelets by anticoagulation protocol: Yes   Plan:  1) Coumadin 1.5mg  again tonight 2) Daily INR 3) Monitor CBC, s/sx bleeding   12/08/16, PharmD, BCPS Clinical Pharmacist Rx Phone # for today: [phone] After 3:30PM, please call Main Rx: #[id number] 10/08/2016 11:56 AM

## 2016-10-08 NOTE — Progress Notes (Signed)
Advanced Heart Failure Rounding Note  PCP:  Primary Cardiologist: Dr Gala Romney  Subjective:    Feeling better but HRs back up. 130-160 with ambulating to bathroom. SBP back up to 130-140s   Objective:   Weight Range: 83.7 kg (184 lb 8 oz) Body mass index is 34.86 kg/m.   Vital Signs:   Temp:  [97.7 F (36.5 C)-98.2 F (36.8 C)] 97.7 F (36.5 C) (09/08 0534) Pulse Rate:  [89-118] 107 (09/08 0534) Resp:  [18-26] 18 (09/08 0534) BP: (128-162)/(87-101) 141/90 (09/08 0534) SpO2:  [92 %-94 %] 93 % (09/08 0534) Weight:  [83.7 kg (184 lb 8 oz)-84.1 kg (185 lb 6.4 oz)] 83.7 kg (184 lb 8 oz) (09/08 0534) Last BM Date: 10/07/16  Weight change: Filed Weights   10/07/16 0300 10/07/16 1614 10/08/16 0534  Weight: 84.2 kg (185 lb 9.6 oz) 84.1 kg (185 lb 6.4 oz) 83.7 kg (184 lb 8 oz)    Intake/Output:   Intake/Output Summary (Last 24 hours) at 10/08/16 0929 Last data filed at 10/08/16 0843  Gross per 24 hour  Intake              600 ml  Output              851 ml  Net             -251 ml      Physical Exam    General:  Elderly. Sitting in chair. No resp difficulty HEENT: normal Neck: supple. no JVD. Carotids 2+ bilat; no bruits. No lymphadenopathy or thryomegaly appreciated. Cor: PMI nondisplaced. IRR IRR  No rubs, gallops or murmurs. Lungs: clear Abdomen: soft, nontender, nondistended. No hepatosplenomegaly. No bruits or masses. Good bowel sounds. Extremities: no cyanosis, clubbing, rash, edema. Neuro: alert & orientedx3, cranial nerves grossly intact. moves all 4 extremities w/o difficulty. + diffuse tremor Affect pleasant    Telemetry   A fib 95-100 at rest 130-160 with ambulation. Personally reviewed   EKG    A fib 60 bpm   Labs    CBC  Recent Labs  10/06/16 2208 10/07/16 0330  WBC 8.9 10.1  NEUTROABS 4.4  --   HGB 14.1 14.2  HCT 42.2 43.5  MCV 98.8 97.8  PLT 290 297   Basic Metabolic Panel  Recent Labs  10/07/16 0330 10/08/16 0323  NA  140 140  K 3.5 4.2  CL 111 108  CO2 20* 23  GLUCOSE 98 107*  BUN 12 12  CREATININE 1.25* 1.24*  CALCIUM 8.2* 8.4*   Liver Function Tests  Recent Labs  10/05/16 1019  AST 28  ALT 13*  ALKPHOS 102  BILITOT 0.9  PROT 7.1  ALBUMIN 3.0*   No results for input(s): LIPASE, AMYLASE in the last 72 hours. Cardiac Enzymes No results for input(s): CKTOTAL, CKMB, CKMBINDEX, TROPONINI in the last 72 hours.  BNP: BNP (last 3 results)  Recent Labs  07/13/16 1515  BNP 367.4*    ProBNP (last 3 results) No results for input(s): PROBNP in the last 8760 hours.   D-Dimer No results for input(s): DDIMER in the last 72 hours. Hemoglobin A1C No results for input(s): HGBA1C in the last 72 hours. Fasting Lipid Panel No results for input(s): CHOL, HDL, LDLCALC, TRIG, CHOLHDL, LDLDIRECT in the last 72 hours. Thyroid Function Tests No results for input(s): TSH, T4TOTAL, T3FREE, THYROIDAB in the last 72 hours.  Invalid input(s): FREET3  Other results:   Imaging    No results found.  Medications:     Scheduled Medications: . amiodarone  200 mg Oral BID  . carbidopa-levodopa  1 tablet Oral BID  . hydroxychloroquine  200 mg Oral BID  . pantoprazole  40 mg Oral Daily  . rOPINIRole  0.5 mg Oral QHS  . Warfarin - Pharmacist Dosing Inpatient   Does not apply q1800    Infusions: . calcium gluconate 1 GM IV      PRN Medications: acetaminophen, ondansetron (ZOFRAN) IV    Patient Profile   83 y/o woman as above with chronic AF and tachy-brady s/p PPM. Seen in ER on 9/5  for elevated AF rates-->. Caridzem increased from 120 to 240 daily.   Admitted with profound hypotension.  Assessment/Plan   1. Hypotension - in the setting increased diltiazem. Briefly on neo.  - Resolved. Now off pressors 2. A fib, chronic - HRs now climbing back up. Will avoid diltiazem due to profound vasodilation and hypotension - Increase amio to 400 bid for now. Increase lopressor - INR 2.6.  On coumadin. Pharmacy managing INR.  3. Chronic Diastolic Heart Failure  -Volume status and weight stable. Resume home lasix today - Echo this admit EF 60-65%. RV ok.  4. AKI- 2/2 to hypotension.  -Creatinine stable at 1.25 today 5. Elevated Lactic Acid- Trending down 3.2>1.7. No S/S of infection.  6. H/O Tachy/Brady with PPM 2010  Length of Stay: 2 Arvilla Meres, MD  10/08/2016, 9:29 AM  Advanced Heart Failure Team Pager [phone] (M-F; 7a - 4p)  Please contact [facility] for night-coverage after hours (4p -7a ) and weekends on [email or web address]

## 2016-10-09 LAB — PROTIME-INR
INR: 2.52
Prothrombin Time: 27 seconds — ABNORMAL HIGH (ref 11.4–15.2)

## 2016-10-09 MED ORDER — AMIODARONE HCL 400 MG PO TABS: 400.0000 mg | ORAL_TABLET | Freq: Two times a day (BID) | ORAL | 1 refills | Status: DC

## 2016-10-09 MED ORDER — WARFARIN SODIUM 3 MG PO TABS: 1.5000 mg | ORAL_TABLET | Freq: Every day | ORAL | 0 refills | Status: DC

## 2016-10-09 NOTE — Progress Notes (Signed)
Advanced Heart Failure Rounding Note  PCP:  Primary Cardiologist: Dr Gala Romney  Subjective:    Feels good. Anxious to go home.  HRs much improved. Now 65-105  Objective:   Weight Range: 182 lb 4.8 oz (82.7 kg) Body mass index is 34.45 kg/m.   Vital Signs:   Temp:  [98.2 F (36.8 C)-98.4 F (36.9 C)] 98.2 F (36.8 C) (09/09 0544) Pulse Rate:  [93-99] 93 (09/09 0544) Resp:  [18] 18 (09/09 0544) BP: (116-150)/(67-76) 150/76 (09/09 0544) SpO2:  [93 %-94 %] 93 % (09/09 0544) Weight:  [182 lb 4.8 oz (82.7 kg)] 182 lb 4.8 oz (82.7 kg) (09/09 0544) Last BM Date: 10/07/16 (ref stool softener states she will take one at home if d/c'd)  Weight change: Filed Weights   10/07/16 1614 10/08/16 0534 10/09/16 0544  Weight: 185 lb 6.4 oz (84.1 kg) 184 lb 8 oz (83.7 kg) 182 lb 4.8 oz (82.7 kg)    Intake/Output:   Intake/Output Summary (Last 24 hours) at 10/09/16 1148 Last data filed at 10/09/16 0545  Gross per 24 hour  Intake              120 ml  Output             1000 ml  Net             -880 ml      Physical Exam    General:  Well appearing. Sitting in chair  No resp difficulty HEENT: normal Neck: supple. no JVD. Carotids 2+ bilat; no bruits. No lymphadenopathy or thryomegaly appreciated. Cor: PMI nondisplaced. Irregular rate & rhythm. No rubs, gallops or murmurs. Lungs: clear Abdomen: soft, nontender, nondistended. No hepatosplenomegaly. No bruits or masses. Good bowel sounds. Extremities: no cyanosis, clubbing, rash, edema Neuro: alert & orientedx3, cranial nerves grossly intact. moves all 4 extremities w/o difficulty. Affect pleasant + tremor    Telemetry   A fib 65-105Personally reviewed   EKG    A fib 60 bpm   Labs    CBC  Recent Labs  10/06/16 2208 10/07/16 0330  WBC 8.9 10.1  NEUTROABS 4.4  --   HGB 14.1 14.2  HCT 42.2 43.5  MCV 98.8 97.8  PLT 290 297   Basic Metabolic Panel  Recent Labs  10/07/16 0330 10/08/16 0323  NA 140 140  K  3.5 4.2  CL 111 108  CO2 20* 23  GLUCOSE 98 107*  BUN 12 12  CREATININE 1.25* 1.24*  CALCIUM 8.2* 8.4*   Liver Function Tests No results for input(s): AST, ALT, ALKPHOS, BILITOT, PROT, ALBUMIN in the last 72 hours. No results for input(s): LIPASE, AMYLASE in the last 72 hours. Cardiac Enzymes No results for input(s): CKTOTAL, CKMB, CKMBINDEX, TROPONINI in the last 72 hours.  BNP: BNP (last 3 results)  Recent Labs  07/13/16 1515  BNP 367.4*    ProBNP (last 3 results) No results for input(s): PROBNP in the last 8760 hours.   D-Dimer No results for input(s): DDIMER in the last 72 hours. Hemoglobin A1C No results for input(s): HGBA1C in the last 72 hours. Fasting Lipid Panel No results for input(s): CHOL, HDL, LDLCALC, TRIG, CHOLHDL, LDLDIRECT in the last 72 hours. Thyroid Function Tests No results for input(s): TSH, T4TOTAL, T3FREE, THYROIDAB in the last 72 hours.  Invalid input(s): FREET3  Other results:   Imaging    No results found.   Medications:     Scheduled Medications: . amiodarone  400 mg Oral BID  .  carbidopa-levodopa  1 tablet Oral BID  . furosemide  20 mg Oral Daily  . hydroxychloroquine  200 mg Oral BID  . metoprolol tartrate  25 mg Oral BID  . pantoprazole  40 mg Oral Daily  . rOPINIRole  0.5 mg Oral QHS  . Warfarin - Pharmacist Dosing Inpatient   Does not apply q1800    Infusions: . calcium gluconate 1 GM IV      PRN Medications: acetaminophen, ondansetron (ZOFRAN) IV    Patient Profile   83 y/o woman as above with chronic AF and tachy-brady s/p PPM. Seen in ER on 9/5  for elevated AF rates-->. Caridzem increased from 120 to 240 daily.   Admitted with profound hypotension.  Assessment/Plan   1. Hypotension - in the setting increased diltiazem. Briefly on neo.  - Resolved. Now off pressors 2. A fib, chronic - HRs now climbing back up. Will avoid diltiazem due to profound vasodilation and hypotension - HR improved on  increased amio at 400 bid and lopressor 25 bid. I explained to her that tremors may get worse on amio but we will cut back in 1-2 weeks  - INR 2.6. On coumadin. Pharmacy managing INR.  3. Chronic Diastolic Heart Failure  -Volume status and weight stable. Resume home lasix today - Echo this admit EF 60-65%. RV ok.  4. AKI- 2/2 to hypotension.  -Creatinine stable at 1.24 today 5. Elevated Lactic Acid- Trending down 3.2>1.7. No S/S of infection.  6. H/O Tachy/Brady with PPM 2010  Ok to go home today. Stop diltiazem  Increase amio to 400 bid. Continue lopressor 25 bid. Taper down amio as outpatient.   Length of Stay: 3 Arvilla Meres, MD  10/09/2016, 11:48 AM  Advanced Heart Failure Team Pager [phone] (M-F; 7a - 4p)  Please contact [facility] for night-coverage after hours (4p -7a ) and weekends on [email or web address]

## 2016-10-09 NOTE — Progress Notes (Signed)
ANTICOAGULATION CONSULT NOTE - Follow Up Consult  Pharmacy Consult for Coumadin Indication: atrial fibrillation  Allergies  Allergen Reactions  . Lipitor [Atorvastatin] Other (See Comments)    Myalgias (noted in neurologist note on 08/23/2013 when admitted for acute CVA).  08/20/15:  Pravastatin started at that time. However, patient is not taking Pravastatin/told okay to stop it by PCP. [facility] said it was last filled in 07/2013)   . Morphine And Related Other (See Comments)    hallucinations    Patient Measurements: Height: 5\' 1"  (154.9 cm) Weight: 182 lb 4.8 oz (82.7 kg) IBW/kg (Calculated) : 47.8  Vital Signs: Temp: 98.2 F (36.8 C) (09/09 0544) Temp Source: Oral (09/09 0544) BP: 150/76 (09/09 0544) Pulse Rate: 93 (09/09 0544)  Labs:  Recent Labs  10/06/16 1407  10/06/16 2208 10/07/16 0330 10/08/16 0323 10/09/16 0511  HGB 15.0  --  14.1 14.2  --   --   HCT 44.0  --  42.2 43.5  --   --   PLT  --   --  290 297  --   --   LABPROT  --   < > 29.8* 30.2* 28.0* 27.0*  INR  --   < > 2.87 2.92 2.64 2.52  CREATININE 1.40*  --   --  1.25* 1.24*  --   < > = values in this interval not displayed.  Estimated Creatinine Clearance: 28.8 mL/min (A) (by C-G formula based on SCr of 1.24 mg/dL (H)).  Assessment: 89yof continues on coumadin for afib. INR remains therapeutic at 2.52. Amiodarone further increased on 9/8 to 400mg  bid (from 200mg  daily at home), so will continue empirically decreased coumadin dose.  Discussed warfarin plan with Dr. 11/8 - Rx updated discharge warfarin dose, and he will have Home Health RN check INR early next week.  CBC stable. No bleeding documented. PTA dose: 3mg  daily  Goal of Therapy:  INR 2-3 Monitor platelets by anticoagulation protocol: Yes   Plan:  Coumadin 1.5mg  PO daily on discharge INR check outpatient set up early next week per Bensimhon   , PharmD, BCPS Clinical Pharmacist Rx Phone # for today:  [phone] After 3:30PM, please call Main Rx: #[id number] 10/09/2016 1:32 PM

## 2016-10-09 NOTE — Plan of Care (Signed)
Problem: Education: Goal: Understanding of medication regimen will improve Outcome: Progressing Reviewed medications with patient. Pt needs reinforcement to ensure full understanding.

## 2016-10-09 NOTE — Discharge Summary (Signed)
Discharge Summary    Patient ID: [name],  MRN: [id number], DOB/AGE: 1923/08/02 83 y.o.  Admit date: 10/06/2016 Discharge date: 10/09/2016  Primary Care Provider: Charlott Rakes Primary Cardiologist: Dr. Gala Romney   Discharge Diagnoses    1. Hypotension due to calcium channel blocker 2. Chronic atrial fibrillation with RVR 3. AKI 4. H/o tachy-brady syndrome s/p PPM  Allergies Allergies  Allergen Reactions  . Lipitor [Atorvastatin] Other (See Comments)    Myalgias (noted in neurologist note on 08/23/2013 when admitted for acute CVA).  08/20/15:  Pravastatin started at that time. However, patient is not taking Pravastatin/told okay to stop it by PCP. [facility] said it was last filled in 07/2013)   . Morphine And Related Other (See Comments)    hallucinations    Diagnostic Studies/Procedures    2D Echo 10/07/16 Study Conclusions  - Left ventricle: The cavity size was normal. Wall thickness was   increased in a pattern of mild LVH. Systolic function was normal.   The estimated ejection fraction was in the range of 60% to 65%.   Wall motion was normal; there were no regional wall motion   abnormalities. - Aortic valve: There was trivial regurgitation. - Mitral valve: Valve area by pressure half-time: 1.88 cm^2. - Tricuspid valve: There was mild-moderate regurgitation. - Pulmonary arteries: Systolic pressure was mildly increased. PA   peak pressure: 36 mm Hg (S).    History of Present Illness     83 yo female with PMH of diastolic HF, chronic AFib complicated by tachy/brady syndrome s/p PPM in 2010, chronic dyspnea, HTN and GERD who presented to the [facility] ED on 10/06/16 with weakness and hypotension.   Per records, the patient had presented to the ED the day prior with palpitations and found to be in Afib RVR with rates in the 140s. She had not received her morning medications prior to coming to the ED. HH RN had come to check on her related to LE cellulitis dx  several weeks prior. Noted that her HR was elevated after she had walked back from the bathroom and had uneasiness in her chest. RN called EMS, and she was brought to the ED.   She was given Cardizem IV via EMS prior to arrival and started on Dilt gtt in the ED. Cardiology consulted and noted her HR in the 130-140s. Improved on Dilt gtt into the 90s. Not felt to be in HF on exam/CXR. She was given 60mg  short-acting Cardizem with drip stopped. Home Dilt was increased from 120mg  daily to 240mg  daily. Continued on home coumadin.   She presented back to the ED the following day with weakness, near syncope and hypotension. EKG showed vpaced rhythm, and was hypotensive with systolic pressures in the 70s. Given calcium gluconate in the ED, and started on IVFs, and neo-synephrine. Admitted by CHF team.   Hospital Course     Pt was admitted to telemetry. Her hypotension was felt related to increased dose of diltiazem. She also had AKI, which was felt related to her hypotension. Her home diltiazem, metoprolol, amiodarone and lasix were initially held. She was continued on IVFs and neo-synephrine. BP improved and neo synephrine was weaned off. She recovered well. AKI resolved. She was transitioned back to some of her home meds. Diltiazem was premenantely discontinued due to profound vasodilation and hypotension. Metoprolol and amiodarone were restarted and titrated up for rate control of her afib. Her lasix was also resumed. She tolerated this well. 2D echo was  obtained and showed normal LVEF at 60-65%. She had no recurrent issues. She had no further hypotension. HR was well controlled. She was ambulating well with cardiac rehab. On 10/09/16, she was seen and examined by Dr. Gala Romney, who determined she was stable for discharge home. She will be discharged home on 400 mg of amiodarone BID, with plans to taper down as an outpatient. She was continued on metoprolol 25 mg BID. Post hospital f/u will be arranged.    Consultants: none    Discharge Vitals Blood pressure (!) 150/76, pulse 93, temperature 98.2 F (36.8 C), temperature source Oral, resp. rate 18, height 5\' 1"  (1.549 m), weight 182 lb 4.8 oz (82.7 kg), SpO2 93 %.  Filed Weights   10/07/16 1614 10/08/16 0534 10/09/16 0544  Weight: 185 lb 6.4 oz (84.1 kg) 184 lb 8 oz (83.7 kg) 182 lb 4.8 oz (82.7 kg)    Labs & Radiologic Studies    CBC  Recent Labs  10/06/16 2208 10/07/16 0330  WBC 8.9 10.1  NEUTROABS 4.4  --   HGB 14.1 14.2  HCT 42.2 43.5  MCV 98.8 97.8  PLT 290 297   Basic Metabolic Panel  Recent Labs  10/07/16 0330 10/08/16 0323  NA 140 140  K 3.5 4.2  CL 111 108  CO2 20* 23  GLUCOSE 98 107*  BUN 12 12  CREATININE 1.25* 1.24*  CALCIUM 8.2* 8.4*   Liver Function Tests No results for input(s): AST, ALT, ALKPHOS, BILITOT, PROT, ALBUMIN in the last 72 hours. No results for input(s): LIPASE, AMYLASE in the last 72 hours. Cardiac Enzymes No results for input(s): CKTOTAL, CKMB, CKMBINDEX, TROPONINI in the last 72 hours. BNP Invalid input(s): POCBNP D-Dimer No results for input(s): DDIMER in the last 72 hours. Hemoglobin A1C No results for input(s): HGBA1C in the last 72 hours. Fasting Lipid Panel No results for input(s): CHOL, HDL, LDLCALC, TRIG, CHOLHDL, LDLDIRECT in the last 72 hours. Thyroid Function Tests No results for input(s): TSH, T4TOTAL, T3FREE, THYROIDAB in the last 72 hours.  Invalid input(s): FREET3 _____________  Dg Chest Port 1 View  Result Date: 10/06/2016 CLINICAL DATA:  83 year old female with a history of atrial fibrillation EXAM: PORTABLE CHEST 1 VIEW COMPARISON:  10/05/2016 FINDINGS: Cardiomediastinal silhouette unchanged in size and contour. Double density over the lower mediastinum with air-fluid level, compatible with known large hernia. Opacities at the lung bases partially obscuring the hemidiaphragm. Coarsened interstitial markings, similar to prior. No significant interlobular  septal thickening. No pneumothorax. Unchanged position of cardiac pacing device on the left chest wall. IMPRESSION: Low lung volumes with basilar opacities, potentially atelectasis/consolidation, with small pleural effusions not excluded. Large hiatal hernia again demonstrated. Electronically Signed   By: 12/05/2016 D.O.   On: 10/06/2016 14:19   Dg Chest Port 1 View  Result Date: 10/05/2016 CLINICAL DATA:  83 year old female with shortness of breath. Chronic atrial fibrillation with subjective tachycardia recently. EXAM: PORTABLE CHEST 1 VIEW COMPARISON:  09/06/2016 and earlier. FINDINGS: Portable AP upright view at 1236 hours. Stable cardiomegaly and mediastinal contours. Chronic hiatal hernia. Stable left chest dual lead cardiac pacemaker. Pulmonary vascularity does appear mildly progressed, with basilar predominant congestion. Chronic increased AP dimension to the chest better demonstrated on the two view study 08/18/2015. No definite pleural effusions. No pneumothorax. Chronic retrocardiac atelectasis. No other confluent pulmonary opacity. IMPRESSION: 1. Increased basilar predominant pulmonary vascularity suspicious for mild or developing interstitial edema in this clinical setting. 2. No other acute cardiopulmonary abnormality. 3. Chronic hiatal hernia  and mild cardiomegaly. Electronically Signed   By: Odessa Fleming M.D.   On: 10/05/2016 12:56   Disposition   Pt is being discharged home today in good condition.  Follow-up Plans & Appointments    Follow-up Information    Bensimhon, Bevelyn Buckles, MD Follow up on 11/15/2016.   Specialty:  Cardiology Why:  10:20 AM  Contact information: [address] [place] [address] [phone]          Discharge Instructions    Diet - low sodium heart healthy    Complete by:  As directed    Increase activity slowly    Complete by:  As directed       Discharge Medications   Current Discharge Medication List    CONTINUE these  medications which have CHANGED   Details  amiodarone (PACERONE) 400 MG tablet Take 1 tablet (400 mg total) by mouth 2 (two) times daily. Qty: 60 tablet, Refills: 1      CONTINUE these medications which have NOT CHANGED   Details  calcium carbonate (TUMS) 500 MG chewable tablet Chew 1 tablet by mouth daily as needed for indigestion or heartburn.    carbidopa-levodopa (SINEMET CR) 50-200 MG tablet Take 1 tablet by mouth 2 (two) times daily.    furosemide (LASIX) 20 MG tablet Take 1 tablet (20 mg total) by mouth daily. Qty: 30 tablet, Refills: 6    hydroxychloroquine (PLAQUENIL) 200 MG tablet Take 200 mg by mouth 2 (two) times daily.    metoprolol tartrate (LOPRESSOR) 50 MG tablet Take 25 mg by mouth 2 (two) times daily.     omeprazole (PRILOSEC) 20 MG capsule Take 20 mg by mouth daily.      rOPINIRole (REQUIP) 0.5 MG tablet Take 0.5 mg by mouth at bedtime.    tiZANidine (ZANAFLEX) 2 MG tablet Take 2 mg by mouth as needed.     warfarin (COUMADIN) 3 MG tablet Take 1 tablet (3 mg total) by mouth daily. Qty: 30 tablet, Refills: 0      STOP taking these medications     diltiazem (CARDIZEM LA) 240 MG 24 hr tablet           Outstanding Labs/Studies   None   Duration of Discharge Encounter   Greater than 30 minutes including physician time.  Signed, Robbie Lis PA-C 10/09/2016, 12:14 PM  Patient seen and examined with the above-signed Advanced Practice Provider and/or Housestaff. I personally reviewed laboratory data, imaging studies and relevant notes. I independently examined the patient and formulated the important aspects of the plan. I have edited the note to reflect any of my changes or salient points. I have personally discussed the plan with the patient and/or family.  She is improved. Can go home today. Will follow closely in clinic.   Arvilla Meres, MD  8:55 PM

## 2016-10-11 DIAGNOSIS — I5032 Chronic diastolic (congestive) heart failure: Secondary | ICD-10-CM | POA: Diagnosis not present

## 2016-10-11 DIAGNOSIS — N183 Chronic kidney disease, stage 3 (moderate): Secondary | ICD-10-CM | POA: Diagnosis not present

## 2016-10-11 DIAGNOSIS — I130 Hypertensive heart and chronic kidney disease with heart failure and stage 1 through stage 4 chronic kidney disease, or unspecified chronic kidney disease: Secondary | ICD-10-CM | POA: Diagnosis not present

## 2016-10-11 DIAGNOSIS — I480 Paroxysmal atrial fibrillation: Secondary | ICD-10-CM | POA: Diagnosis not present

## 2016-10-11 DIAGNOSIS — I69344 Monoplegia of lower limb following cerebral infarction affecting left non-dominant side: Secondary | ICD-10-CM | POA: Diagnosis not present

## 2016-10-11 DIAGNOSIS — L03116 Cellulitis of left lower limb: Secondary | ICD-10-CM | POA: Diagnosis not present

## 2016-10-12 DIAGNOSIS — I69344 Monoplegia of lower limb following cerebral infarction affecting left non-dominant side: Secondary | ICD-10-CM | POA: Diagnosis not present

## 2016-10-12 DIAGNOSIS — I5032 Chronic diastolic (congestive) heart failure: Secondary | ICD-10-CM | POA: Diagnosis not present

## 2016-10-12 DIAGNOSIS — I130 Hypertensive heart and chronic kidney disease with heart failure and stage 1 through stage 4 chronic kidney disease, or unspecified chronic kidney disease: Secondary | ICD-10-CM | POA: Diagnosis not present

## 2016-10-12 DIAGNOSIS — L03116 Cellulitis of left lower limb: Secondary | ICD-10-CM | POA: Diagnosis not present

## 2016-10-12 DIAGNOSIS — N183 Chronic kidney disease, stage 3 (moderate): Secondary | ICD-10-CM | POA: Diagnosis not present

## 2016-10-12 DIAGNOSIS — I480 Paroxysmal atrial fibrillation: Secondary | ICD-10-CM | POA: Diagnosis not present

## 2016-10-13 DIAGNOSIS — L03116 Cellulitis of left lower limb: Secondary | ICD-10-CM | POA: Diagnosis not present

## 2016-10-13 DIAGNOSIS — N183 Chronic kidney disease, stage 3 (moderate): Secondary | ICD-10-CM | POA: Diagnosis not present

## 2016-10-13 DIAGNOSIS — I480 Paroxysmal atrial fibrillation: Secondary | ICD-10-CM | POA: Diagnosis not present

## 2016-10-13 DIAGNOSIS — I69344 Monoplegia of lower limb following cerebral infarction affecting left non-dominant side: Secondary | ICD-10-CM | POA: Diagnosis not present

## 2016-10-13 DIAGNOSIS — I130 Hypertensive heart and chronic kidney disease with heart failure and stage 1 through stage 4 chronic kidney disease, or unspecified chronic kidney disease: Secondary | ICD-10-CM | POA: Diagnosis not present

## 2016-10-13 DIAGNOSIS — I5032 Chronic diastolic (congestive) heart failure: Secondary | ICD-10-CM | POA: Diagnosis not present

## 2016-10-18 DIAGNOSIS — I5032 Chronic diastolic (congestive) heart failure: Secondary | ICD-10-CM | POA: Diagnosis not present

## 2016-10-18 DIAGNOSIS — L03116 Cellulitis of left lower limb: Secondary | ICD-10-CM | POA: Diagnosis not present

## 2016-10-18 DIAGNOSIS — I130 Hypertensive heart and chronic kidney disease with heart failure and stage 1 through stage 4 chronic kidney disease, or unspecified chronic kidney disease: Secondary | ICD-10-CM | POA: Diagnosis not present

## 2016-10-18 DIAGNOSIS — I69344 Monoplegia of lower limb following cerebral infarction affecting left non-dominant side: Secondary | ICD-10-CM | POA: Diagnosis not present

## 2016-10-18 DIAGNOSIS — I480 Paroxysmal atrial fibrillation: Secondary | ICD-10-CM | POA: Diagnosis not present

## 2016-10-18 DIAGNOSIS — N183 Chronic kidney disease, stage 3 (moderate): Secondary | ICD-10-CM | POA: Diagnosis not present

## 2016-10-21 DIAGNOSIS — I69344 Monoplegia of lower limb following cerebral infarction affecting left non-dominant side: Secondary | ICD-10-CM | POA: Diagnosis not present

## 2016-10-21 DIAGNOSIS — L03116 Cellulitis of left lower limb: Secondary | ICD-10-CM | POA: Diagnosis not present

## 2016-10-21 DIAGNOSIS — I5032 Chronic diastolic (congestive) heart failure: Secondary | ICD-10-CM | POA: Diagnosis not present

## 2016-10-21 DIAGNOSIS — I480 Paroxysmal atrial fibrillation: Secondary | ICD-10-CM | POA: Diagnosis not present

## 2016-10-21 DIAGNOSIS — I130 Hypertensive heart and chronic kidney disease with heart failure and stage 1 through stage 4 chronic kidney disease, or unspecified chronic kidney disease: Secondary | ICD-10-CM | POA: Diagnosis not present

## 2016-10-21 DIAGNOSIS — N183 Chronic kidney disease, stage 3 (moderate): Secondary | ICD-10-CM | POA: Diagnosis not present

## 2016-10-24 DIAGNOSIS — I69344 Monoplegia of lower limb following cerebral infarction affecting left non-dominant side: Secondary | ICD-10-CM | POA: Diagnosis not present

## 2016-10-24 DIAGNOSIS — L03116 Cellulitis of left lower limb: Secondary | ICD-10-CM | POA: Diagnosis not present

## 2016-10-24 DIAGNOSIS — I130 Hypertensive heart and chronic kidney disease with heart failure and stage 1 through stage 4 chronic kidney disease, or unspecified chronic kidney disease: Secondary | ICD-10-CM | POA: Diagnosis not present

## 2016-10-24 DIAGNOSIS — I5032 Chronic diastolic (congestive) heart failure: Secondary | ICD-10-CM | POA: Diagnosis not present

## 2016-10-24 DIAGNOSIS — I480 Paroxysmal atrial fibrillation: Secondary | ICD-10-CM | POA: Diagnosis not present

## 2016-10-24 DIAGNOSIS — N183 Chronic kidney disease, stage 3 (moderate): Secondary | ICD-10-CM | POA: Diagnosis not present

## 2016-10-25 ENCOUNTER — Encounter: Payer: Medicare HMO | Admitting: Internal Medicine

## 2016-10-25 DIAGNOSIS — I4891 Unspecified atrial fibrillation: Secondary | ICD-10-CM | POA: Diagnosis not present

## 2016-10-25 DIAGNOSIS — F419 Anxiety disorder, unspecified: Secondary | ICD-10-CM | POA: Diagnosis not present

## 2016-10-25 DIAGNOSIS — Z9189 Other specified personal risk factors, not elsewhere classified: Secondary | ICD-10-CM | POA: Diagnosis not present

## 2016-10-25 DIAGNOSIS — Z6834 Body mass index (BMI) 34.0-34.9, adult: Secondary | ICD-10-CM | POA: Diagnosis not present

## 2016-10-26 DIAGNOSIS — I5032 Chronic diastolic (congestive) heart failure: Secondary | ICD-10-CM | POA: Diagnosis not present

## 2016-10-26 DIAGNOSIS — I130 Hypertensive heart and chronic kidney disease with heart failure and stage 1 through stage 4 chronic kidney disease, or unspecified chronic kidney disease: Secondary | ICD-10-CM | POA: Diagnosis not present

## 2016-10-26 DIAGNOSIS — L03116 Cellulitis of left lower limb: Secondary | ICD-10-CM | POA: Diagnosis not present

## 2016-10-26 DIAGNOSIS — I480 Paroxysmal atrial fibrillation: Secondary | ICD-10-CM | POA: Diagnosis not present

## 2016-10-26 DIAGNOSIS — N183 Chronic kidney disease, stage 3 (moderate): Secondary | ICD-10-CM | POA: Diagnosis not present

## 2016-10-26 DIAGNOSIS — I69344 Monoplegia of lower limb following cerebral infarction affecting left non-dominant side: Secondary | ICD-10-CM | POA: Diagnosis not present

## 2016-11-08 DIAGNOSIS — Z6834 Body mass index (BMI) 34.0-34.9, adult: Secondary | ICD-10-CM | POA: Diagnosis not present

## 2016-11-08 DIAGNOSIS — Z5181 Encounter for therapeutic drug level monitoring: Secondary | ICD-10-CM | POA: Diagnosis not present

## 2016-11-08 DIAGNOSIS — Z7901 Long term (current) use of anticoagulants: Secondary | ICD-10-CM | POA: Diagnosis not present

## 2016-11-08 DIAGNOSIS — Z23 Encounter for immunization: Secondary | ICD-10-CM | POA: Diagnosis not present

## 2016-11-08 DIAGNOSIS — I4891 Unspecified atrial fibrillation: Secondary | ICD-10-CM | POA: Diagnosis not present

## 2016-11-15 ENCOUNTER — Ambulatory Visit (HOSPITAL_COMMUNITY)
Admission: RE | Admit: 2016-11-15 | Discharge: 2016-11-15 | Disposition: A | Discharge status: Home / Self Care | Payer: Medicare HMO | Source: Ambulatory Visit | Attending: Internal Medicine | Admitting: Internal Medicine

## 2016-11-15 ENCOUNTER — Encounter (HOSPITAL_COMMUNITY): Payer: Self-pay | Admitting: Internal Medicine

## 2016-11-15 VITALS — BP 144/94 | HR 94 | Wt 181.5 lb

## 2016-11-15 DIAGNOSIS — Z79899 Other long term (current) drug therapy: Secondary | ICD-10-CM | POA: Diagnosis not present

## 2016-11-15 DIAGNOSIS — I10 Essential (primary) hypertension: Secondary | ICD-10-CM | POA: Diagnosis not present

## 2016-11-15 DIAGNOSIS — Z7901 Long term (current) use of anticoagulants: Secondary | ICD-10-CM | POA: Insufficient documentation

## 2016-11-15 DIAGNOSIS — Z8673 Personal history of transient ischemic attack (TIA), and cerebral infarction without residual deficits: Secondary | ICD-10-CM | POA: Insufficient documentation

## 2016-11-15 DIAGNOSIS — Z950 Presence of cardiac pacemaker: Secondary | ICD-10-CM | POA: Insufficient documentation

## 2016-11-15 DIAGNOSIS — I5032 Chronic diastolic (congestive) heart failure: Secondary | ICD-10-CM | POA: Diagnosis not present

## 2016-11-15 DIAGNOSIS — R251 Tremor, unspecified: Secondary | ICD-10-CM | POA: Insufficient documentation

## 2016-11-15 DIAGNOSIS — I110 Hypertensive heart disease with heart failure: Secondary | ICD-10-CM | POA: Diagnosis not present

## 2016-11-15 DIAGNOSIS — I482 Chronic atrial fibrillation, unspecified: Secondary | ICD-10-CM

## 2016-11-15 MED ORDER — AMIODARONE HCL 400 MG PO TABS
200.0000 mg | ORAL_TABLET | Freq: Two times a day (BID) | ORAL | 1 refills | Status: DC
Start: 2016-11-15 — End: 2017-05-16

## 2016-11-15 MED ORDER — METOPROLOL TARTRATE 50 MG PO TABS: 50.0000 mg | ORAL_TABLET | Freq: Two times a day (BID) | ORAL | 3 refills | Status: DC

## 2016-11-15 NOTE — Patient Instructions (Signed)
Decrease Amiodarone to 200 mg (1/2 tab) Twice daily   Increase Metoprolol to 50 mg (1 tab) Twice daily   Keep follow up appointment with Dr Graciela Husbands as scheduled 11/22/16  We will contact you in 3 months to schedule your next appointment.

## 2016-11-15 NOTE — Progress Notes (Signed)
Advanced Heart Failure Clinic Note   Patient ID: [name], female   DOB: 07/03/1923, 83 y.o.   MRN: [id number]  HPI: [name] is a delightful 83 year old woman with a history of diastolic HF, chronic atrial fibrillation c/b tachybrady syndrome s/p pacemaker placement in 2010, chronic dyspnea as well as hypertension and gastroesophageal reflux disease.  09/2010   L [place] normal LAD mild narrowing in the proximal and midvessel. The first diagonal branch has a 40% ostial stenosis. The left circumflex coronary artery has mild irregularities up to 10- 20%. The right coronary artery is a dominant vessel. It has mild wall irregularities less than or equal to 10%.   Has chronic AF, she saw [name] in past and he noted that nearly 40% of her rates were > 110. Suggested amiodarone vs AV node ablation. We went with amiodarone and she felt much better.   Presented to ED on 10/06/16 with weakness and hypotension found to be in Afib RVR with rates in the 140s.  Improved on Dilt gtt into the 90s. Not felt to be in HF on exam/CXR. She was given 60mg  short-acting Cardizem with drip stopped. Home Dilt was increased from 120mg  daily to 240mg  daily. Continued on home coumadin. She presented back to the ED the following day with weakness, near syncope and hypotension. EKG showed vpaced rhythm, and was hypotensive with systolic pressures in the 70s. Diltiazem stopped. Metoprolol and amiodarone were restarted and titrated up for rate control of her afib.   Follow up:  She presents today for routine f/u. Feels ok. Says HR goes quite fast with any ambulation. No dizziness or syncope. No orthopnea, PND. + Dependent edema during day. Resolves overnight.    Labs 09/26/12: K 3.5, creatinine 0.84, proBNP 1265          10/09/12: K 3.5, Cr 0.83, AST 21, ALT 11  ECHO 07/2009: EF 55-60%, mild MR, LA mod dilated, RA mild dilated ECHO 10/14: EF 55-60%, biatrial enlargement, normal RV size/systolic function, PA systolic pressure  39 mmHg.  ECHO 7/17:  EF 55-60% RVSP 39 biatrial enlargement.   SH: Lives in [place] with her husband, nonsmoker.   ROS: All systems negative except as listed in HPI, PMH and Problem List.  Past Medical History:  Diagnosis Date  . Anticoagulation goal of INR 2 to 3 08/21/2015  . Arthritis    oa  . Balance problem   . Chronic diastolic CHF (congestive heart failure) (HCC)   . Dyspnea   . GERD (gastroesophageal reflux disease)   . Hyperlipidemia   . Hypertension   . Hypotensive episode 08/21/2015  . Mild CAD    a. nonobst CAD by cath 09/2010.  . Obesity   . Osteoporosis   . Pacemaker-St. Jude   . Permanent atrial fibrillation (HCC)    a. c/b tachy-brady syndrome s/p PPM 2010.  08/23/2015 Stroke [facility]) 07/2013   Current Outpatient Prescriptions  Medication Sig Dispense Refill  . amiodarone (PACERONE) 400 MG tablet Take 1 tablet (400 mg total) by mouth 2 (two) times daily. 60 tablet 1  . calcium carbonate (TUMS) 500 MG chewable tablet Chew 1 tablet by mouth daily as needed for indigestion or heartburn.    . carbidopa-levodopa (SINEMET CR) 50-200 MG tablet Take 1 tablet by mouth 2 (two) times daily.    . furosemide (LASIX) 20 MG tablet Take 1 tablet (20 mg total) by mouth daily. 30 tablet 6  . hydroxychloroquine (PLAQUENIL) 200 MG tablet Take 200 mg by mouth  2 (two) times daily.    . metoprolol tartrate (LOPRESSOR) 50 MG tablet Take 25 mg by mouth 2 (two) times daily.     [name] omeprazole (PRILOSEC) 20 MG capsule Take 20 mg by mouth daily.      [name] rOPINIRole (REQUIP) 0.5 MG tablet Take 0.5 mg by mouth at bedtime.    [name] tiZANidine (ZANAFLEX) 2 MG tablet Take 2 mg by mouth as needed.     . warfarin (COUMADIN) 3 MG tablet Take 0.5 tablets (1.5 mg total) by mouth daily. 30 tablet 0   No current facility-administered medications for this encounter.    Vitals:   11/15/16 1044  BP: (!) 142/104  Pulse: 94  SpO2: 97%  Weight: 181 lb 8 oz (82.3 kg)   Filed Weights   11/15/16 1044  Weight: 181 lb 8  oz (82.3 kg)    PHYSICAL EXAM: General:  Elderly. Diffuse tremor.. No resp difficulty HEENT: normal Neck: supple. JVP 6. Severe kyphoscoliosis Carotids 2+ bilat; no bruits. No lymphadenopathy or thryomegaly appreciated. Cor: PMI nondisplaced.Irr tachy 2/6 AS  No rubs, gallops or murmurs. Lungs: clear Abdomen: soft, nontender, nondistended. No hepatosplenomegaly. No bruits or masses. Good bowel sounds. Extremities: no cyanosis, clubbing, rash, edema Neuro: alert & orientedx3, cranial nerves grossly intact. moves all 4 extremities w/o difficulty. Affect pleasant  ECG AF 96 Personally reviewed  PPM interrogated personally in clinic: chronic AF 30-40% HRs  > 110 bpm    ASSESSMENT & PLAN:  1) Chronic Diastolic HF, EF 55-60% - Volume status ok. Continue current diuretic dose  2) Chronic A-fib  - Rates remain elevated despite amio 400 bid and metoprolol 25 bid.  - Cardizem stopped due to profound hypotension - Do NOT rechallenge - Will cut back amio to 200 bid. Decrease amio to 200 bid. - Will f/u with [name] to assess response to increased b-blocker.  - If HRs remain elevated can consider AVN ablation and bivpacing 3) Tremors - Stable  3) HTN -Blood pressure mildly elevated. Increase metoprolol 4) CVA S/P 2015. -on warfarin & statin    [name],MD 11:01 AM

## 2016-11-15 NOTE — Addendum Note (Signed)
Encounter addended by: Dolores Patty, MD on: 11/15/2016  5:07 PM<BR>    Actions taken: Charge Capture section accepted

## 2016-11-22 ENCOUNTER — Other Ambulatory Visit: Payer: Self-pay | Admitting: Internal Medicine

## 2016-11-22 ENCOUNTER — Encounter: Payer: Self-pay | Admitting: Internal Medicine

## 2016-11-22 ENCOUNTER — Ambulatory Visit (INDEPENDENT_AMBULATORY_CARE_PROVIDER_SITE_OTHER): Payer: Medicare HMO | Admitting: Internal Medicine

## 2016-11-22 VITALS — BP 92/60 | HR 73 | Ht 61.0 in | Wt 182.5 lb

## 2016-11-22 DIAGNOSIS — I482 Chronic atrial fibrillation, unspecified: Secondary | ICD-10-CM

## 2016-11-22 DIAGNOSIS — Z950 Presence of cardiac pacemaker: Secondary | ICD-10-CM | POA: Diagnosis not present

## 2016-11-22 DIAGNOSIS — Z79899 Other long term (current) drug therapy: Secondary | ICD-10-CM | POA: Diagnosis not present

## 2016-11-22 LAB — CUP PACEART INCLINIC DEVICE CHECK
Battery Voltage: 2.9 V
Brady Statistic RA Percent Paced: 0 %
Date Time Interrogation Session: 20181023145243
Implantable Lead Implant Date: 20100616
Implantable Lead Location: 753859
Implantable Lead Location: 753860
Implantable Lead Model: 1948
Lead Channel Impedance Value: 662.5 Ohm
Lead Channel Pacing Threshold Amplitude: 0.75 V
Lead Channel Pacing Threshold Pulse Width: 0.4 ms
Lead Channel Setting Pacing Amplitude: 2 V
Lead Channel Setting Sensing Sensitivity: 2 mV
MDC IDC LEAD IMPLANT DT: 20100616
MDC IDC MSMT BATTERY REMAINING LONGEVITY: 88 mo
MDC IDC MSMT LEADCHNL RV SENSING INTR AMPL: 12 mV
MDC IDC PG IMPLANT DT: 20100616
MDC IDC SET LEADCHNL RV PACING PULSEWIDTH: 0.4 ms
MDC IDC STAT BRADY RV PERCENT PACED: 8.4 %
Pulse Gen Model: 2110
Pulse Gen Serial Number: 2285053

## 2016-11-22 NOTE — Progress Notes (Signed)
-     Patient Care Team: Charlott Rakes, MD as PCP - General (Family Medicine) Charlott Rakes, MD as Referring Physician (Family Medicine)   HPI  [name] is a 83 y.o. female Seen in followup for pacer implanted for tachybrady and now with permanent AF     She saw Dr. Dorthea Cove last week. His note was reviewed. Beta blockers were uptitrated. There has been major struggles with dyspnea and medications. Right now she is puttering around the house without much difficulty. Longer walks down to the mailbox for example are associated with tachypalpitations.  Efforts to increase rate control with calcium blockers resulted in severe hypotension. Currently she is also being rate controlled with amiodarone.   Date Cr K Hgb  9/18 1.24 4.2 14.2  /      Date TSH LFTs PFTs  4/18  7.84     8/18  3.6 28(9/18)          She is [name] grandmother.   She previously was seen by the heart failure clinic; it has been more than 2 years.   Echo 10/14 EF>>55-60% Echo 7/17 EF-normal Past Medical History:  Diagnosis Date  . Anticoagulation goal of INR 2 to 3 08/21/2015  . Arthritis    oa  . Balance problem   . Chronic diastolic CHF (congestive heart failure) (HCC)   . Dyspnea   . GERD (gastroesophageal reflux disease)   . Hyperlipidemia   . Hypertension   . Hypotensive episode 08/21/2015  . Mild CAD    a. nonobst CAD by cath 09/2010.  . Obesity   . Osteoporosis   . Pacemaker-St. Jude   . Permanent atrial fibrillation (HCC)    a. c/b tachy-brady syndrome s/p PPM 2010.  [name] Stroke [facility]) 07/2013    Past Surgical History:  Procedure Laterality Date  . ABDOMINAL HYSTERECTOMY    . BREAST LUMPECTOMY Right   . CARDIAC CATHETERIZATION    . COLECTOMY  1991  . INSERT / REPLACE / REMOVE PACEMAKER     St Jude  . SPLENECTOMY    . TOTAL ABDOMINAL HYSTERECTOMY W/ BILATERAL SALPINGOOPHORECTOMY  1974    Current Outpatient Prescriptions  Medication Sig Dispense Refill  . amiodarone  (PACERONE) 400 MG tablet Take 0.5 tablets (200 mg total) by mouth 2 (two) times daily. 60 tablet 1  . calcium carbonate (TUMS) 500 MG chewable tablet Chew 1 tablet by mouth daily as needed for indigestion or heartburn.    . carbidopa-levodopa (SINEMET CR) 50-200 MG tablet Take 1 tablet by mouth 2 (two) times daily.    . furosemide (LASIX) 20 MG tablet Take 1 tablet (20 mg total) by mouth daily. 30 tablet 6  . hydroxychloroquine (PLAQUENIL) 200 MG tablet Take 200 mg by mouth 2 (two) times daily.    . metoprolol tartrate (LOPRESSOR) 50 MG tablet Take 1 tablet (50 mg total) by mouth 2 (two) times daily. 60 tablet 3  . omeprazole (PRILOSEC) 20 MG capsule Take 20 mg by mouth daily.      [name] rOPINIRole (REQUIP) 0.5 MG tablet Take 0.5 mg by mouth at bedtime.    [name] tiZANidine (ZANAFLEX) 2 MG tablet Take 2 mg by mouth as needed.     . warfarin (COUMADIN) 3 MG tablet Take 0.5 tablets (1.5 mg total) by mouth daily. 30 tablet 0   No current facility-administered medications for this visit.     Allergies  Allergen Reactions  . Lipitor [Atorvastatin] Other (See Comments)    Myalgias (  noted in neurologist note on 08/23/2013 when admitted for acute CVA).  08/20/15:  Pravastatin started at that time. However, patient is not taking Pravastatin/told okay to stop it by PCP. [facility] said it was last filled in 07/2013)   . Morphine And Related Other (See Comments)    hallucinations    Review of Systems negative except from HPI and PMH  Physical Exam BP 92/60 (BP Location: Left Arm, Patient Position: Sitting, Cuff Size: Normal)   Pulse 73   Ht 5\' 1"  (1.549 m)   Wt 182 lb 8 oz (82.8 kg)   BMI 34.48 kg/m  Well developed and nourished in no acute distress HENT normal Neck supple with JVP-flat Clear Device pocket well healed; without hematoma or erythema.  There is no tethering kyphosis Regular rate and rhythm, no murmurs or gallops Abd-soft with active BS No Clubbing cyanosis tr edema Skin-warm and  dry A & Oriented  Grossly normal sensory and motor function    ECG demonstrates atrial fibrillation at 84 =-/07/42   *Assessment and  Plan  HFpEF     Atrial fib-permanent  Perhaps improved rate control  Hypertension  Well controlled  Pacemaker St Jude The patient's device was interrogated.  The information was reviewed. No changes were made in the programming.       Euvolemic continue current meds  Will check rate profile in one month.  Pt has been fast for some time, and functional status remians stable  Will follow  Discussed with Dr DB   Check amio labs,  Recent big change in TSH so will recheck

## 2016-11-22 NOTE — Patient Instructions (Addendum)
Medication Instructions:  Your physician recommends that you continue on your current medications as directed. Please refer to the Current Medication list given to you today.   Labwork: Your physician recommends that you return for lab work in: TODAY (TSH).   Testing/Procedures: Remote monitoring is used to monitor your Pacemaker of ICD from home. This monitoring reduces the number of office visits required to check your device to one time per year. It allows [place] to keep an eye on the functioning of your device to ensure it is working properly. You are scheduled for a device check from home on 12/26/16. You may send your transmission at any time that day. If you have a wireless device, the transmission will be sent automatically. After your physician reviews your transmission, you will receive a postcard with your next transmission date.    Follow-Up: Your physician wants you to follow-up in: 12 MONTHS WITH DR Graciela Husbands. You will receive a reminder letter in the mail two months in advance. If you don't receive a letter, please call our office to schedule the follow-up appointment.   If you need a refill on your cardiac medications before your next appointment, please call your pharmacy.

## 2016-11-23 LAB — TSH: TSH: 13.78 u[IU]/mL — ABNORMAL HIGH (ref 0.450–4.500)

## 2016-12-06 DIAGNOSIS — I4891 Unspecified atrial fibrillation: Secondary | ICD-10-CM | POA: Diagnosis not present

## 2016-12-06 DIAGNOSIS — Z7901 Long term (current) use of anticoagulants: Secondary | ICD-10-CM | POA: Diagnosis not present

## 2016-12-06 DIAGNOSIS — Z5181 Encounter for therapeutic drug level monitoring: Secondary | ICD-10-CM | POA: Diagnosis not present

## 2016-12-06 DIAGNOSIS — R946 Abnormal results of thyroid function studies: Secondary | ICD-10-CM | POA: Diagnosis not present

## 2016-12-20 ENCOUNTER — Other Ambulatory Visit: Payer: Self-pay

## 2016-12-20 DIAGNOSIS — Z9229 Personal history of other drug therapy: Secondary | ICD-10-CM

## 2016-12-20 NOTE — Progress Notes (Unsigned)
TS

## 2016-12-26 ENCOUNTER — Encounter: Payer: Medicare HMO | Admitting: *Deleted

## 2016-12-29 ENCOUNTER — Encounter: Payer: Self-pay | Admitting: Cardiology

## 2017-01-05 DIAGNOSIS — Z5181 Encounter for therapeutic drug level monitoring: Secondary | ICD-10-CM | POA: Diagnosis not present

## 2017-01-05 DIAGNOSIS — Z7901 Long term (current) use of anticoagulants: Secondary | ICD-10-CM | POA: Diagnosis not present

## 2017-01-05 DIAGNOSIS — I4891 Unspecified atrial fibrillation: Secondary | ICD-10-CM | POA: Diagnosis not present

## 2017-01-05 DIAGNOSIS — Z6835 Body mass index (BMI) 35.0-35.9, adult: Secondary | ICD-10-CM | POA: Diagnosis not present

## 2017-01-16 DIAGNOSIS — I129 Hypertensive chronic kidney disease with stage 1 through stage 4 chronic kidney disease, or unspecified chronic kidney disease: Secondary | ICD-10-CM | POA: Diagnosis not present

## 2017-01-16 DIAGNOSIS — R7303 Prediabetes: Secondary | ICD-10-CM | POA: Diagnosis not present

## 2017-01-16 DIAGNOSIS — E785 Hyperlipidemia, unspecified: Secondary | ICD-10-CM | POA: Diagnosis not present

## 2017-02-06 DIAGNOSIS — I509 Heart failure, unspecified: Secondary | ICD-10-CM | POA: Diagnosis not present

## 2017-02-06 DIAGNOSIS — R7303 Prediabetes: Secondary | ICD-10-CM | POA: Diagnosis not present

## 2017-02-06 DIAGNOSIS — E785 Hyperlipidemia, unspecified: Secondary | ICD-10-CM | POA: Diagnosis not present

## 2017-02-06 DIAGNOSIS — I129 Hypertensive chronic kidney disease with stage 1 through stage 4 chronic kidney disease, or unspecified chronic kidney disease: Secondary | ICD-10-CM | POA: Diagnosis not present

## 2017-02-21 ENCOUNTER — Telehealth: Payer: Self-pay | Admitting: Cardiology

## 2017-02-21 ENCOUNTER — Encounter: Payer: Medicare HMO | Admitting: *Deleted

## 2017-02-21 NOTE — Telephone Encounter (Signed)
Spoke with pt and reminded pt of remote transmission that is due today. Pt verbalized understanding.   

## 2017-02-23 ENCOUNTER — Encounter: Payer: Self-pay | Admitting: Cardiology

## 2017-03-15 DIAGNOSIS — Z5181 Encounter for therapeutic drug level monitoring: Secondary | ICD-10-CM | POA: Diagnosis not present

## 2017-03-15 DIAGNOSIS — Z7901 Long term (current) use of anticoagulants: Secondary | ICD-10-CM | POA: Diagnosis not present

## 2017-03-15 DIAGNOSIS — Z6834 Body mass index (BMI) 34.0-34.9, adult: Secondary | ICD-10-CM | POA: Diagnosis not present

## 2017-03-15 DIAGNOSIS — I4891 Unspecified atrial fibrillation: Secondary | ICD-10-CM | POA: Diagnosis not present

## 2017-04-10 DIAGNOSIS — N182 Chronic kidney disease, stage 2 (mild): Secondary | ICD-10-CM | POA: Diagnosis not present

## 2017-04-10 DIAGNOSIS — M810 Age-related osteoporosis without current pathological fracture: Secondary | ICD-10-CM | POA: Diagnosis not present

## 2017-04-10 DIAGNOSIS — Z789 Other specified health status: Secondary | ICD-10-CM | POA: Diagnosis not present

## 2017-04-10 DIAGNOSIS — I129 Hypertensive chronic kidney disease with stage 1 through stage 4 chronic kidney disease, or unspecified chronic kidney disease: Secondary | ICD-10-CM | POA: Diagnosis not present

## 2017-04-12 DIAGNOSIS — Z139 Encounter for screening, unspecified: Secondary | ICD-10-CM | POA: Diagnosis not present

## 2017-04-12 DIAGNOSIS — I4891 Unspecified atrial fibrillation: Secondary | ICD-10-CM | POA: Diagnosis not present

## 2017-04-12 DIAGNOSIS — Z0000 Encounter for general adult medical examination without abnormal findings: Secondary | ICD-10-CM | POA: Diagnosis not present

## 2017-04-12 DIAGNOSIS — Z1331 Encounter for screening for depression: Secondary | ICD-10-CM | POA: Diagnosis not present

## 2017-04-12 DIAGNOSIS — Z9181 History of falling: Secondary | ICD-10-CM | POA: Diagnosis not present

## 2017-04-24 DIAGNOSIS — L03114 Cellulitis of left upper limb: Secondary | ICD-10-CM | POA: Diagnosis not present

## 2017-04-24 DIAGNOSIS — Z6835 Body mass index (BMI) 35.0-35.9, adult: Secondary | ICD-10-CM | POA: Diagnosis not present

## 2017-05-16 ENCOUNTER — Telehealth (HOSPITAL_COMMUNITY): Payer: Self-pay

## 2017-05-16 MED ORDER — AMIODARONE HCL 200 MG PO TABS: 200.0000 mg | ORAL_TABLET | Freq: Two times a day (BID) | ORAL | 11 refills | Status: DC

## 2017-05-16 NOTE — Telephone Encounter (Signed)
Received call to verify amiodarone dose with [facility]. They have two Rx on file that are conflicting. Advised per notes from Dr. Gala Romney and Dr. Graciela Husbands our last records indicate patient should be on amio 200 BID. Patient is also past due for OV in CHF clinic, will forward to schedulers.  Ave Filter, RN

## 2017-05-30 DIAGNOSIS — I509 Heart failure, unspecified: Secondary | ICD-10-CM | POA: Diagnosis not present

## 2017-05-30 DIAGNOSIS — R000 Tachycardia, unspecified: Secondary | ICD-10-CM | POA: Diagnosis not present

## 2017-06-06 ENCOUNTER — Encounter (HOSPITAL_COMMUNITY): Payer: Medicare HMO

## 2017-06-27 ENCOUNTER — Other Ambulatory Visit (HOSPITAL_COMMUNITY): Payer: Self-pay | Admitting: Internal Medicine

## 2017-06-28 DIAGNOSIS — Z6833 Body mass index (BMI) 33.0-33.9, adult: Secondary | ICD-10-CM | POA: Diagnosis not present

## 2017-06-28 DIAGNOSIS — Z139 Encounter for screening, unspecified: Secondary | ICD-10-CM | POA: Diagnosis not present

## 2017-06-28 DIAGNOSIS — R000 Tachycardia, unspecified: Secondary | ICD-10-CM | POA: Diagnosis not present

## 2017-06-28 DIAGNOSIS — I509 Heart failure, unspecified: Secondary | ICD-10-CM | POA: Diagnosis not present

## 2017-06-30 DIAGNOSIS — Z6833 Body mass index (BMI) 33.0-33.9, adult: Secondary | ICD-10-CM | POA: Diagnosis not present

## 2017-06-30 DIAGNOSIS — I509 Heart failure, unspecified: Secondary | ICD-10-CM | POA: Diagnosis not present

## 2017-07-11 ENCOUNTER — Other Ambulatory Visit: Payer: Self-pay

## 2017-07-11 NOTE — Patient Outreach (Signed)
[facility]) Care Management  07/11/2017  DESTYN PARFITT 1923/09/10 [id number]  TELEPHONE SCREENING Referral date: 07/03/17 Referral source: primary MD Referral reason: Concerned whether patient taking her medication correctly. Nursing evaluation at home/ support for family Insurance: [employer]  Telephone call to patient regarding regarding primary MD referral. HIPAA verified with patient. Patient gave verbal authorization to speak with daughter, Jessee Avers regarding her health information. Patient states her daughter is not there.  States she has returned to [place].  Explained reason for call to patient. Patient states she is aware that her doctor wanted to have a nurse come out to see her.  Patient states, " they are concerned about my medications. I am able to handle them but they think I can't."  Patient reports she is blind in her left eye but has good vision in her right. Denies wearing glasses.   RNCM discussed and offered [facility] care management services. Patient verbally agreed.  Patient states she has atrial fibrillation and gets short of breath at times.  Patient states, " they told me I have heart failure.  I hold a lot of fluid." Patient reports she does not weigh consistently.   Patient states she has good family supports. States she lives with family and has family living around her.   PLAN: RNCM will refer patient to community case manager.   George Ina RN,BSN,CCM [facility] Telephonic  [phone]

## 2017-07-17 ENCOUNTER — Other Ambulatory Visit: Payer: Self-pay | Admitting: *Deleted

## 2017-07-17 NOTE — Patient Outreach (Signed)
[facility]) Care Management  07/17/2017  [name] Sep 02, 1923 [id number]   Referral received : 07/10/17 Referral source: [facility] telephonic CM, Dr. Yetta Flock  Referral reason : Concern whether Patient taking medication correctly , Nursing evaluation at home/support for family Insurance: [facility]  Successful outreach call to patient , HIPAA verified,explained reason for the call patient states she was aware her doctor wanted a nurse to see her .  Patient report that she is doing pretty good on today. Patient discussed she lives at home with her granddaughter and patient states he daughter [place].  Patient discussed her medical conditions of :  Heart failure , states sometimes she has a build up of fluid, patient reports that sh has scales at home but does not weigh daily. She discussed her lunch today she had beanie weenies and vienna sausage, they was easy for her to get to. she reports her daughter brings her dinner every day   Atrial Fib, patient states she also has a pacemaker, she has seen Dr.Bensihmon, but it has been a while. Patient states she also takes that high price medicine for a blood thinner.   Patient states she manages she medication daily, using a pill organizer her family helps with picking up medication at pharmacy.   Assessment  Patient will benefit from [facility] care manager home visit for further  evaluation of care needs and education on self management of chronic conditions .  Patient is agreeable to home visit.    Plan  Will plan home visit in the next week. Will send PCP barrier involvement letter.  Will send patient welcome letter .    Egbert Garibaldi, RN, [facility] Care Management,Care Management Coordinator  [phone]- Mobile [phone]- Toll Free Main Office

## 2017-07-19 ENCOUNTER — Encounter: Payer: Self-pay | Admitting: *Deleted

## 2017-07-21 ENCOUNTER — Encounter: Payer: Self-pay | Admitting: Cardiology

## 2017-07-26 ENCOUNTER — Encounter: Payer: Self-pay | Admitting: *Deleted

## 2017-07-26 ENCOUNTER — Other Ambulatory Visit: Payer: Self-pay | Admitting: *Deleted

## 2017-07-26 NOTE — Patient Outreach (Addendum)
[facility]) Care Management   07/27/2017  LIBERTIE HAUSLER 07-10-1923 [id number]  ABENA ERDMAN is an 84 y.o. female    Referral received : 07/10/17 Referral source: [facility] telephonic CM, Dr. Yetta Flock  Referral reason : Concern whether Patient taking medication correctly , Nursing evaluation at home/support for family Insurance: [facility]  Initial home visit   Subjective:  Patient states she gets a little short of breath walking to the mailbox states it does not happen every day she attributes this to history of atrial fib.  Patient discussed she has follow up visit with Dr. Yetta Flock on 6/28.  Patient reports feeling a little nauseated in the mornings but it clears up doing the day most of the time.  Patient complains of bilateral knee discomfort right more than left states she will discuss with MD at visit asking about a cortisone shot.   Patient discussed she fills her pill organizer,sometimes she takes medication directly from the bottle admits to getting a little mixed up sometimes and may leave some pills off, she discussed sometimes her granddaughter helps her with filling pill the  organizer.   Patient discussed she missed her appointment in May with Dr.Benshimon and she received a letter from the office.   Patient discussed weighing but not on a daily basis, she does not add salt to food after cooking but during.  Objective:  BP 112/68 (BP Location: Left Arm, Patient Position: Sitting, Cuff Size: Normal)   Pulse 88   Resp 18   Ht 1.549 m (5\' 1" )   Wt 182 lb 12.8 oz (82.9 kg)   SpO2 96%   BMI 34.54 kg/m  Review of Systems  Constitutional: Negative.   HENT: Negative.   Eyes: Negative.   Respiratory: Positive for shortness of breath.   Cardiovascular: Positive for leg swelling.  Gastrointestinal: Negative.   Genitourinary: Negative.   Musculoskeletal: Positive for joint pain.       Complains of knee discomfort at times right knee worse than left   Skin:  Negative.   Neurological: Negative.  Negative for dizziness.  Endo/Heme/Allergies: Negative.   Psychiatric/Behavioral: Negative.     Physical Exam  Constitutional: She is oriented to person, place, and time. She appears well-developed and well-nourished.  Cardiovascular: Normal rate and normal heart sounds.  Respiratory: Effort normal and breath sounds normal.  GI: Soft.  Neurological: She is alert and oriented to person, place, and time.  Skin: Skin is warm and dry.  Lower legs skin dry scaly, discolored area left leg quarter size area scab appearance, skin intact  Psychiatric: She has a normal mood and affect. Her behavior is normal. Judgment and thought content normal.    Encounter Medications:   Outpatient Encounter Medications as of 07/26/2017  Medication Sig  . amiodarone (PACERONE) 200 MG tablet Take 1 tablet (200 mg total) by mouth 2 (two) times daily. CALL CHF CLINIC TO MAKE APPT [phone]  . calcium carbonate (TUMS) 500 MG chewable tablet Chew 1 tablet by mouth daily as needed for indigestion or heartburn.  . carbidopa-levodopa (SINEMET CR) 50-200 MG tablet Take 1 tablet by mouth 2 (two) times daily.  . furosemide (LASIX) 20 MG tablet Take 1 tablet (20 mg total) by mouth daily.  . hydrOXYzine (ATARAX/VISTARIL) 10 MG tablet Take 10 mg by mouth every 6 (six) hours as needed for anxiety.  . metoprolol tartrate (LOPRESSOR) 50 MG tablet Take 1 tablet (50 mg total) by mouth 2 (two) times daily.  [phone] omeprazole (PRILOSEC) 20  MG capsule Take 20 mg by mouth daily.    . Rivaroxaban (XARELTO) 15 MG TABS tablet Take 15 mg by mouth daily with supper.  [name] rOPINIRole (REQUIP) 0.5 MG tablet Take 0.5 mg by mouth at bedtime.  [name] tiZANidine (ZANAFLEX) 2 MG tablet Take 2 mg by mouth as needed.   [name] amiodarone (PACERONE) 200 MG tablet Take 1 tablet (200 mg total) by mouth 2 (two) times daily. (Patient not taking: Reported on 07/26/2017)  . hydroxychloroquine (PLAQUENIL) 200 MG tablet Take 200 mg by  mouth 2 (two) times daily.  [name] warfarin (COUMADIN) 3 MG tablet Take 0.5 tablets (1.5 mg total) by mouth daily. (Patient not taking: Reported on 07/26/2017)  . [DISCONTINUED] diltiazem (CARDIZEM) 120 MG tablet Take by mouth 3 (three) times daily.    No facility-administered encounter medications on file as of 07/26/2017.     Functional Status:   In your present state of health, do you have any difficulty performing the following activities: 07/17/2017 10/07/2016  Hearing? Y N  Vision? N N  Difficulty concentrating or making decisions? N N  Walking or climbing stairs? Y N  Comment avoid steps  -  Dressing or bathing? N N  Doing errands, shopping? Malvin Johns  Comment family provides transporation  -  Quarry manager and eating ? Y -  Comment family provides -  Using the Toilet? N -  In the past six months, have you accidently leaked urine? Y -  Comment wears depends  -  Do you have problems with loss of bowel control? N -  Managing your Medications? N -  Managing your Finances? Y -  Comment family manages  -  Housekeeping or managing your Housekeeping? Y -  Comment family does housekeeping  -  Some recent data might be hidden    Fall/Depression Screening:    Fall Risk  07/17/2017 12/15/2015 09/23/2015  Falls in the past year? No Yes No  Number falls in past yr: - 1 -  Injury with Fall? - Yes -  Comment - patient states she skinned her right leg.   -  Risk Factor Category  - High Fall Risk -  Risk for fall due to : - Other (Comment) -  Risk for fall due to: Comment - Patient states she missed her step and fell -  Follow up - Falls prevention discussed -   PHQ 2/9 Scores 07/17/2017 07/11/2017 09/23/2015 09/03/2015  PHQ - 2 Score 0 0 0 0    Assessment:   Initial home visit, patient is home alone her granddaughter that lives with her is at work, she does have a daughters that lives next door to her that are available they provide transportation to appointments.   Reviewed [facility] care management  program, provided new patient packet,[facility] calendar and contact information.  Heart failure : patient has talking scales available for use at home, will benefit from continued follow up on Heart failure zones, action plan, self care management . Missed appointment at heart failure clinic, need assist with rescheduling.At this time not monitoring blood pressure at home.    Fall Risk : no recent falls, has assistive devices cane and walker available but does not use.  Patient has medical alert button that she does not wear, she takes her phone when she walks outside and to bathroom.Patient has walk in shower,stands for shower does not use her shower chair, only showers when family in home.  Risk for falls, will provide fall prevention education .  Medications : Medications reviewed, patient keeps pill bottles in a basket along with a twice a day pill organizer. noted inconsistency with Lasix bottle dated as being filled on 4/ 19, 30 pills, with 25 remaining in the bottle, patient has bottle of omeprazole filled 11/21 5 pills in bottle reports she does not take them regularly. Patient has Cardizem  120 mg daily pill bottle in home filled 5/28,with 25  pills remaining will follow up with PCP office regarding whether this is current for patient .  When reviewing pills able to state basically what each medication is used for. She is interested in assistance with being more organized with her medication and making sure is she taking the right ones.   Plan [facility] consent signed, Provided and reviewed THN welcome packet.  Provided [facility] heart failure packet Living a better life with heart failure, encouraged regarding weight monitoring daily.Assisted with rescheduling appointment at heart failure clinic for 7/24. Reviewed [facility]N high/low salt food choices.  Reviewed fall prevention safety measures, encouraged use of cane/walker to helps with balance Will place [facility] pharmacy consult for concern related to patient taking  medications correctly and keeping them organized. Call to PCP office to verify patient medication list able to leave a message on nurse line for return call.  Will send PCP visit note.  Will plan follow up call in the next 2 weeks.    [facility] CM Care Plan Problem One     Most Recent Value  Care Plan Problem One  knowledge deficit related to congestive heart failure  Role Documenting the Problem One  Care Management Coordinator  Care Plan for Problem One  Active  THN Long Term Goal   Patient will be able to verbalize increase knowledge of Heart failure managment over the next 90 day s  THN Long Term Goal Start Date  07/17/17  Interventions for Problem One Long Term Goal  Home visit, provided and reviewed Living wiht heart failure handout and reinforcing importance of taking medicaitons as prescribed, following low salt diet, weighting every day.   THN CM Short Term Goal #1   Patient will verbalize 3 yellow zone symptoms of heart failure within 30 days  [facility] CM Short Term Goal #1 Start Date  07/17/17  Interventions for Short Term Goal #1  Reviewed with patient zones of heart failure chart handout, emphasizing yellow zone symptoms and action plan   THN CM Short Term Goal #2   Patient will report  she is weighing and recording weights daily wiithin 30 days  THN CM Short Term Goal #2 Start Date  07/17/17  Interventions for Short Term Goal #2  During visit, verified patient scales working properly, provided [facility] calendar , and how to document weights, Discussed with patient best time of day to weigh in the morning after going to bathroom.       Egbert Garibaldi, RN, [facility] Care Management,Care Management Coordinator  [phone]- Mobile [phone]- Toll Free Main Office

## 2017-07-27 ENCOUNTER — Other Ambulatory Visit: Payer: Self-pay | Admitting: *Deleted

## 2017-07-27 NOTE — Patient Outreach (Signed)
[facility]) Care Management  07/27/2017  [name] 02-06-1923 [id number]   Care Coordination   Received return call from [place] at Dr.Hodges office, to verify whether patient  has Cardizem 120 on her profile, she states it is listed on patient medication list as per cardiology.  Discussed with nurse that unsure if patient taking medication as bottle nearly full at visit.   Plan  [facility] pharmacist has been consulted, will collaborate  with team member.    Egbert Garibaldi, RN, [facility] Care Management,Care Management Coordinator  [phone]- Mobile [phone]- Toll Free Main Office

## 2017-07-28 ENCOUNTER — Other Ambulatory Visit: Payer: Self-pay | Admitting: *Deleted

## 2017-07-28 NOTE — Patient Outreach (Signed)
[facility]) Care Management  07/28/2017  [name] [birth year] [id number]   Care coordination   Placed call to Heart failure clinic to verify if Diltiazem is on patient record as current medication , noted at home visit patient with prescription last filled on 5/28, 30 pills with 25 remaining.  Placed call to PCP office with return call on 6/27, they have Diltiazem listed at 120 mg per cardiology.  Placed call to patient pharmacy [facility] they report patient with  refills on medication , 9/18, 2/19 and on 5/28.  Spoke with [place] , she verifies patient should not be taking Diltiazem that it was stopped 10/18 by Dr. Jones Broom.  Placed call to patient to communicate not taking Diltiazem, spelled medication for her as she wrote it down and read back. Patient recalls that is the medication that she took 2 of last year when her heart was racing  and  It caused her blood pressure to go down. Patient states she is familiar with medication and has not been taking .     Plan  [facility] pharmacy to consult to help with medication review and adherence .  Will plan return call in the next 2 weeks.   Marilyn Garibaldi, RN, [facility] Care Management,Care Management Coordinator  [phone]- Mobile [phone]- Toll Free Main Office

## 2017-07-30 DIAGNOSIS — I509 Heart failure, unspecified: Secondary | ICD-10-CM | POA: Diagnosis not present

## 2017-07-30 DIAGNOSIS — R000 Tachycardia, unspecified: Secondary | ICD-10-CM | POA: Diagnosis not present

## 2017-07-31 ENCOUNTER — Other Ambulatory Visit: Payer: Self-pay | Admitting: Pharmacist

## 2017-07-31 NOTE — Patient Outreach (Signed)
[facility]) Care Management  07/31/2017  [name] 11/21/1923 [id number]   84 year old female referred to [facility] Care Management by PCP regarding medication adherence concerns. PMHx includes, but not limited to, atrial fibrillation with RVR s/p PPD, HTN, HLD, CKD,diastolic heart failure, hx CVA, tremors, and GERD.   Successful call to [name] today. HIPAA identifiers verified. [name] had difficulty with hearing for much of the conversation.  She reports she lives with her great granddaughter who is at the beach this week.  She reports she has a sister and daughter who live nearby who assist with transportation to medical appointments and to pick up medications.  She confirms that she is aware to not take diltiazem.  She reports she is "doing fine" with her medications but is agreeable for a home visit for me to review them with her.  She agrees that I may contact her family so they may join visit if possible.    Call placed to patient's daughter, "Marilyn" Coy [name].  Sister is agreeable to be present during home visit next week.  She states family is concerned about [name] taking her medications correctly but no-one is comfortable with filling a pillbox for her.  She states patient's current pharmacy does not offer compliance packaging.  She is in support of changing to another pharmacy in the area that can offer this service if patient requires this.    Plan: Home visit with patient and family next Monday, July 8th, at 9:30AM.   Haynes Hoehn, PharmD, [facility] Clinical Pharmacist [facility] [phone]

## 2017-08-01 NOTE — Patient Outreach (Signed)
   This encounter was created in error - please disregard.  Jalal Rauch, RN, PCCN [facility] Care Management,Care Management Coordinator  [phone]- Mobile [phone]- Toll Free Main Office  

## 2017-08-07 ENCOUNTER — Other Ambulatory Visit (HOSPITAL_COMMUNITY): Payer: Self-pay | Admitting: *Deleted

## 2017-08-07 ENCOUNTER — Other Ambulatory Visit: Payer: Self-pay | Admitting: Pharmacist

## 2017-08-07 MED ORDER — METOPROLOL TARTRATE 50 MG PO TABS: 50.0000 mg | ORAL_TABLET | Freq: Two times a day (BID) | ORAL | 0 refills | Status: DC

## 2017-08-07 MED ORDER — RIVAROXABAN 15 MG PO TABS: 15.0000 mg | ORAL_TABLET | Freq: Every day | ORAL | 0 refills | Status: AC

## 2017-08-07 MED ORDER — FUROSEMIDE 20 MG PO TABS: 20.0000 mg | ORAL_TABLET | Freq: Every day | ORAL | 0 refills | Status: DC

## 2017-08-07 MED ORDER — AMIODARONE HCL 200 MG PO TABS: 200.0000 mg | ORAL_TABLET | Freq: Two times a day (BID) | ORAL | 0 refills | Status: AC

## 2017-08-07 NOTE — Patient Outreach (Addendum)
[facility]) Care Management  [facility]   08/07/2017  [name] November 07, 1923 [id number]  84 year old female referred to [facility] Care Management by PCP regarding medication adherence concerns. PMHx includes, but not limited to, atrial fibrillation with RVR s/p PPD, HTN, HLD, CKD,diastolic heart failure, hx CVA, tremors, and GERD.   Subjective: Successful home visit with [name] and her daughter, [name], to review medications.  Patient has all medications in a Tupperware container in the kitchen with a partially filled pillbox.  When asked if she Marilyn taking her medications as directed, patient states "Sometimes I take them,  and sometimes, to tell the truth, I don't."  She states some of the reasons she doesn't take her medications consistently Marilyn due to forgetting, difficulty swallowing, and not feeling that she needs to take medications anymore.  Patient notes that her mother lived to be in her 35's after stopping all her medications.    Objective:   Encounter Medications: Outpatient Encounter Medications as of 08/07/2017  Medication Sig Note  . amiodarone (PACERONE) 200 MG tablet Take 1 tablet (200 mg total) by mouth 2 (two) times daily. CALL CHF CLINIC TO MAKE APPT [phone]   . amiodarone (PACERONE) 200 MG tablet Take 1 tablet (200 mg total) by mouth 2 (two) times daily. (Patient not taking: Reported on 07/26/2017)   . calcium carbonate (TUMS) 500 MG chewable tablet Chew 1 tablet by mouth daily as needed for indigestion or heartburn.   . carbidopa-levodopa (SINEMET CR) 50-200 MG tablet Take 1 tablet by mouth 2 (two) times daily.   . furosemide (LASIX) 20 MG tablet Take 1 tablet (20 mg total) by mouth daily.   . hydroxychloroquine (PLAQUENIL) 200 MG tablet Take 200 mg by mouth 2 (two) times daily.   . hydrOXYzine (ATARAX/VISTARIL) 10 MG tablet Take 10 mg by mouth every 6 (six) hours as needed for anxiety.   . metoprolol tartrate (LOPRESSOR) 50 MG tablet Take 1  tablet (50 mg total) by mouth 2 (two) times daily.   [name] omeprazole (PRILOSEC) 20 MG capsule Take 20 mg by mouth daily.   07/27/2017: Not taking on regular basis  . Rivaroxaban (XARELTO) 15 MG TABS tablet Take 15 mg by mouth daily with supper.   [name] rOPINIRole (REQUIP) 0.5 MG tablet Take 0.5 mg by mouth at bedtime.   [name] tiZANidine (ZANAFLEX) 2 MG tablet Take 2 mg by mouth as needed.  07/26/2017: Ready for pickup at pharmacy  . warfarin (COUMADIN) 3 MG tablet Take 0.5 tablets (1.5 mg total) by mouth daily. (Patient not taking: Reported on 07/26/2017)    No facility-administered encounter medications on file as of 08/07/2017.     Assessment:  Drugs sorted by system:  Neurologic/Psychologic: carbidopa-levodopa, hydroxyzine, ropinirole  Cardiovascular: amiodarone, diltiazem, metoprolol, furosemide, rivaroxaban  Gastrointestinal: omeprazole  Pain: acetaminophen, tizanidine  Miscellaneous: hydroxychloroquine  Duplications in therapy:  Metoprolol + diltiazem bottles both in home.  Per [facility] note, diltiazem was permanently discontinued due to profound vasodilation and hypotension in September 2018.  Call placed to patient's pharmacy and both medications filled in May 2019.   -Call placed to cardiology office, verified patient should no longer be taking diltiazem.   -Call placed to patient's daughter, [name], to update her on discontinuing diltiazem.  Daughter will remove medication from patient's possession.   Medications to avoid in the elderly:  Hydroxyzine: Per the Beers List, this medication Marilyn highly anticholinergic, clearance may be reduced with advanced age, tolerance may develop when used  as a hypnotic and should be avoided in the elderly. Antihistamine use in the elderly poses a greater risk of confusion, dry mouth, constipation, or other anticholinergic effects or toxicity. Per Beers list, medications with strong anticholinergic properties should be avoided in older adults with dementia and  cognitive impairment, those with or at high risk of delirium, and those with lower urinary tract symptoms including benign prostatic hyperplasia.   Medication adherence: Patient Marilyn not taking medications correctly or consistently per review of pillbox and pillbottles in the home and per verbal report from patient.  Several discrepancies found with current medication list in [facility].    Rivaroxaban: Empty pillbottle in the home, per pharmacy, last filled on 06/27/17 for 30 day supply.   Furosemide:  Full pillbottle in the home, last filled in April 2019.   Metoprolol: Not taking consistently, last filled 06/19/2017 for 30 day supply  Omeprazole: Not taking consistently, pillbottle from 12/2016 in the home  Bactrim : Pillbottle with 8 pills left from 3/'19  Hydroxychloroquine: No bottle in home, message left with PCP to clarify if this medication Marilyn still current  Patient and family agreeable to try compliance packs for medications to improve adherence.  [facility] can provide this service with free delivery to patient.  Cost estimated by pharmacist Toniann Fail at $2/weekly pack (therefore $8-10 per month depending on #weeks per month). Packs will be dated for patient and are delivered midday.  [facility] can start compliance packs as soon as all appropriate medications are called into pharmacy by providers.   Medication Management: Patient with history of CVA, not taking statin.  Allergy listed as myalgias.  Consider another trial of statin if clinically warranted.   Patient and family counseled on [employer] OTC benefits.  Phone number provided to family to call regarding quarterly allowance.  Family Marilyn familiar with this benefit as they have used this during previous years.    Plan: I will reach out to PCP and cardiologist to request updated medication list and that providers send new prescriptions on all medications to [facility] for patient to receive compliance packs.  I  will follow-up with pharmacy and patient later this week to ensure all medications received and delivery scheduled.   Haynes Hoehn, PharmD, [facility] Clinical Pharmacist [facility] [phone]

## 2017-08-08 ENCOUNTER — Ambulatory Visit: Payer: Self-pay | Admitting: Pharmacist

## 2017-08-08 ENCOUNTER — Other Ambulatory Visit: Payer: Self-pay | Admitting: Pharmacist

## 2017-08-08 NOTE — Patient Outreach (Signed)
[facility]) Care Management  08/08/2017  [name] 02/09/1923 [id number]   Successful call made to [facility]) regarding compliance packs for [name].  She stated that Dr. Prescott Gum (cardiologist) office called in 85-month supply of amiodarone, furosemide, metoprolol tartrate and rivaroxaban.  [facility] will await additional new prescriptions from Dr. Yetta Flock' office (PCP).  Received return call from [place] at Dr. Yetta Flock' office.  We reviewed patient's medications.  Dr. Yetta Flock' office will be faxing new prescriptions to [facility] for Sinemet, ropinirole, hydroxyzine PRN and tizanidine PRN.  Office in agreement of discontinuation of Prilosec as patient is no longer taking and denies symptoms of GERD.  Per [facility], there was no record of Plaquenil (current or past).  Successful call made to [facility] to clarify patient's Plaquenil prescription history.  Per [facility], there was no record of Plaquenil being filled in the past 3 years.  Patient did not have any bottles of Plaquenil in her home and didn't recognize the name of this medication when prompted.  I will remove Plaquenil from patient's medication list in [facility].   Medication list for [name] has been updated to reflect all of the above changes.  Plan: I will follow-up with pharmacy tomorrow to ensure all medications received and delivery scheduled.     Haynes Hoehn, PharmD, [facility] Clinical Pharmacist [facility] [facility] [phone]

## 2017-08-09 ENCOUNTER — Other Ambulatory Visit: Payer: Self-pay | Admitting: Pharmacist

## 2017-08-09 ENCOUNTER — Other Ambulatory Visit: Payer: Self-pay | Admitting: *Deleted

## 2017-08-09 ENCOUNTER — Ambulatory Visit: Payer: Self-pay | Admitting: Pharmacist

## 2017-08-09 NOTE — Patient Outreach (Signed)
[facility]) Care Management  08/09/2017  [name] Mar 29, 1923 [id number]  Referral received : 07/10/17 Referral source: [facility] telephonic CM, Dr. Yetta Flock  Referral reason : Concern whether Patient taking medication correctly , Nursing evaluation at home/support for family Insurance: [facility]  Successful outreach call to patient, HIPAA information verified. Patient reports that she is doing pretty good.  Patient discussed she continues to tolerate walking to the mailbox, reports using a walker when she walks across the road to her daughters.  Patient reports that her weight today is 174.1 and 175, she denies increase in shortness of breath or swelling.  Patient discussed recent [facility] pharmacy visit and plans to get her medication packaged at local pharmacy and will be delivered to her home.   Discussed with patient upcoming office visit at HF clinic she is able to state the date but unable to recall time. Patient is agreeable to me calling her daughter [name] to remind her of appointment.  Placed call to [name] , HIPAA verified, discussed patient upcoming office visit , she inquired about time of visit , updated her and she will place it on her calendar. She states family will be available to assist patient to visit.   Patient agreeable to follow up home visit.   Plan  Will plan return home visit in the next 3 weeks, daughter agreeable to attend.  Reviewed worsening symptoms to notify MD of   Egbert Garibaldi, RN, [facility] Care Management,Care Management Coordinator  [phone]- Mobile [phone]- Toll Free Main Office

## 2017-08-09 NOTE — Patient Outreach (Signed)
[facility]) Care Management  08/09/2017  [name] 22-Feb-1923 [id number]   Successful call made to [facility]) regarding compliance packs for [name].  She stated that all prescriptions have been called in and they are currently working on putting her compliance packs together.  [facility] states they will attempt delivery tomorrow (08/10/17) between 12-2pm.  Successful call to patient's daughter [name]) to inform her of delivery scheduled for [name] tomorrow.  She also stated that she was able to get a few Xarelto tablets to bridge patient to supply that will be filled in compliance packs.  Plan: I will follow-up with patient next week to assess compliance packs.  Kieth Brightly, PharmD, [facility] Clinical Pharmacist [facility]  [phone]

## 2017-08-16 ENCOUNTER — Other Ambulatory Visit: Payer: Self-pay | Admitting: Pharmacist

## 2017-08-16 ENCOUNTER — Ambulatory Visit: Payer: Self-pay | Admitting: Pharmacist

## 2017-08-16 NOTE — Patient Outreach (Signed)
[facility]) Care Management  08/16/2017  [name] 1923/05/20 [id number]   Successful call to [name] today to follow up regarding her new compliance packs from [facility]. HIPAA identifiers verified. [name] voiced that the compliance packs were going well.  She states that it is nice that her medications are sorted out by day and reports that she has been taking medications daily. Called placed to patient's daughter, "Marilyn" Coy [name], however there was no answer or voicemail available.  I will follow up with her later today.  Phone continues to be 'busy" x3 attempts.  Plan: I will follow up with daughter tomorrow  Kieth Brightly, PharmD, [facility] Clinical Pharmacist [facility]  [phone]

## 2017-08-17 ENCOUNTER — Ambulatory Visit: Payer: Self-pay | Admitting: Pharmacist

## 2017-08-17 ENCOUNTER — Other Ambulatory Visit: Payer: Self-pay | Admitting: Pharmacist

## 2017-08-17 NOTE — Patient Outreach (Signed)
[facility]) Care Management  08/17/2017  [name] December 16, 1923 [id number]   Successful call to patient's daughter Posey Boyer) to follow up regarding her new compliance packs from [facility].HIPAA identifiers verified.[name] states that her mom really likes them and has been compliant.  She goes on to say, "it's working really well".  She states all other medication has been removed from the home and has no other questions or concerns.  Plan: I will close this case.  Please don't hesitate to reach out for Pharmacy assistance if needed in the future  Kieth Brightly, PharmD, [facility] Clinical Pharmacist [facility] [employer]  [phone]

## 2017-08-23 ENCOUNTER — Encounter (HOSPITAL_COMMUNITY): Payer: Medicare HMO

## 2017-08-25 ENCOUNTER — Encounter: Payer: Self-pay | Admitting: Cardiology

## 2017-08-30 ENCOUNTER — Ambulatory Visit: Payer: Medicare HMO | Admitting: *Deleted

## 2017-08-30 ENCOUNTER — Encounter (HOSPITAL_COMMUNITY): Payer: Self-pay

## 2017-08-30 ENCOUNTER — Ambulatory Visit (HOSPITAL_COMMUNITY)
Admission: RE | Admit: 2017-08-30 | Discharge: 2017-08-30 | Disposition: A | Discharge status: Home / Self Care | Payer: Medicare HMO | Source: Ambulatory Visit | Attending: Cardiology | Admitting: Cardiology

## 2017-08-30 ENCOUNTER — Other Ambulatory Visit: Payer: Self-pay | Admitting: *Deleted

## 2017-08-30 VITALS — BP 156/92 | HR 71 | Wt 180.2 lb

## 2017-08-30 DIAGNOSIS — R251 Tremor, unspecified: Secondary | ICD-10-CM | POA: Diagnosis not present

## 2017-08-30 DIAGNOSIS — Z8673 Personal history of transient ischemic attack (TIA), and cerebral infarction without residual deficits: Secondary | ICD-10-CM | POA: Insufficient documentation

## 2017-08-30 DIAGNOSIS — I509 Heart failure, unspecified: Secondary | ICD-10-CM | POA: Diagnosis not present

## 2017-08-30 DIAGNOSIS — I4439 Other atrioventricular block: Secondary | ICD-10-CM | POA: Diagnosis not present

## 2017-08-30 DIAGNOSIS — I495 Sick sinus syndrome: Secondary | ICD-10-CM | POA: Insufficient documentation

## 2017-08-30 DIAGNOSIS — Z9181 History of falling: Secondary | ICD-10-CM | POA: Diagnosis not present

## 2017-08-30 DIAGNOSIS — Z79899 Other long term (current) drug therapy: Secondary | ICD-10-CM | POA: Diagnosis not present

## 2017-08-30 DIAGNOSIS — I4892 Unspecified atrial flutter: Secondary | ICD-10-CM | POA: Diagnosis present

## 2017-08-30 DIAGNOSIS — I482 Chronic atrial fibrillation, unspecified: Secondary | ICD-10-CM

## 2017-08-30 DIAGNOSIS — Z950 Presence of cardiac pacemaker: Secondary | ICD-10-CM | POA: Diagnosis not present

## 2017-08-30 DIAGNOSIS — I5032 Chronic diastolic (congestive) heart failure: Secondary | ICD-10-CM

## 2017-08-30 DIAGNOSIS — E785 Hyperlipidemia, unspecified: Secondary | ICD-10-CM | POA: Diagnosis not present

## 2017-08-30 DIAGNOSIS — Z6833 Body mass index (BMI) 33.0-33.9, adult: Secondary | ICD-10-CM | POA: Diagnosis not present

## 2017-08-30 DIAGNOSIS — I2510 Atherosclerotic heart disease of native coronary artery without angina pectoris: Secondary | ICD-10-CM | POA: Insufficient documentation

## 2017-08-30 DIAGNOSIS — Z9229 Personal history of other drug therapy: Secondary | ICD-10-CM

## 2017-08-30 DIAGNOSIS — K219 Gastro-esophageal reflux disease without esophagitis: Secondary | ICD-10-CM | POA: Insufficient documentation

## 2017-08-30 DIAGNOSIS — I493 Ventricular premature depolarization: Secondary | ICD-10-CM | POA: Diagnosis not present

## 2017-08-30 DIAGNOSIS — Z7901 Long term (current) use of anticoagulants: Secondary | ICD-10-CM | POA: Diagnosis not present

## 2017-08-30 DIAGNOSIS — R000 Tachycardia, unspecified: Secondary | ICD-10-CM | POA: Diagnosis not present

## 2017-08-30 DIAGNOSIS — E669 Obesity, unspecified: Secondary | ICD-10-CM | POA: Diagnosis not present

## 2017-08-30 DIAGNOSIS — I110 Hypertensive heart disease with heart failure: Secondary | ICD-10-CM | POA: Diagnosis not present

## 2017-08-30 NOTE — Patient Outreach (Signed)
[facility]) Care Management   08/30/2017  [name] Jan 20, 1924 [id number]  [name] is an 84 y.o. female   Routine home visit  Subjective:   Complaint of shortness of breath with walking short distance in home. Patient reports still being able to walk to mailbox on most days  .  Patient reports having problem with back and left shoulder pain yesterday, denies on  today reports family rubbed shoulder area with muscle cream on yesterday and that helped.  Patient discussed having office visit with Dr.Hodges on last week,describing discussion at visit, family present states patient did not have a visit.  Patient discussed having some problems with her memory, sometimes she just forgets to take her medication.   Patient discussed having office visit at heart failure clinic on today.  Patient reports having a red rash irritation at her abdominal fold area.    Objective: Today's Vitals   08/30/17 1118  BP: 114/80  Pulse: 60  Resp: 18  SpO2: 96%  Weight: 175 lb (79.4 kg)  Height: 1.549 m ('5\' 1"'$ )  PainSc: 0-No pain    Review of Systems  Constitutional: Negative.   HENT: Negative.   Eyes: Negative.   Respiratory: Positive for shortness of breath.   Cardiovascular: Positive for leg swelling.  Gastrointestinal: Negative.   Genitourinary: Negative.   Musculoskeletal: Positive for joint pain.  Skin: Negative.   Neurological: Negative.   Endo/Heme/Allergies: Bruises/bleeds easily.  Psychiatric/Behavioral: Positive for memory loss.    Physical Exam  Constitutional: She is oriented to person, place, and time. She appears well-developed and well-nourished.  Cardiovascular: Normal rate and normal heart sounds.  Respiratory: Effort normal and breath sounds normal.  GI: Soft.  Neurological: She is alert and oriented to person, place, and time.  Skin: Skin is warm and dry.     Red rash at abdominal fold area   Psychiatric: She has a normal mood and  affect. Her behavior is normal. Judgment and thought content normal.    Encounter Medications:   Outpatient Encounter Medications as of 08/30/2017  Medication Sig Note  . acetaminophen (TYLENOL) 500 MG tablet Take 500 mg by mouth every 6 (six) hours as needed for mild pain or headache.   [name] amiodarone (PACERONE) 200 MG tablet Take 1 tablet (200 mg total) by mouth 2 (two) times daily.   . carbidopa-levodopa (SINEMET CR) 50-200 MG tablet Take 1 tablet by mouth 2 (two) times daily.   . furosemide (LASIX) 20 MG tablet Take 1 tablet (20 mg total) by mouth daily.   . metoprolol tartrate (LOPRESSOR) 50 MG tablet Take 1 tablet (50 mg total) by mouth 2 (two) times daily.   . Rivaroxaban (XARELTO) 15 MG TABS tablet Take 1 tablet (15 mg total) by mouth daily with supper.   [name] rOPINIRole (REQUIP) 0.5 MG tablet Take 0.5 mg by mouth at bedtime.   . calcium carbonate (TUMS) 500 MG chewable tablet Chew 1 tablet by mouth daily as needed for indigestion or heartburn.   . hydrOXYzine (ATARAX/VISTARIL) 10 MG tablet Take 10 mg by mouth every 6 (six) hours as needed for anxiety.   [name] tiZANidine (ZANAFLEX) 2 MG tablet Take 2 mg by mouth 2 (two) times daily as needed.  07/26/2017: Ready for pickup at pharmacy   No facility-administered encounter medications on file as of 08/30/2017.     Functional Status:   In your present state of health, do you have any difficulty performing the following activities: 07/17/2017 10/07/2016  Hearing? Y N  Vision? N N  Difficulty concentrating or making decisions? N N  Walking or climbing stairs? Y N  Comment avoid steps  -  Dressing or bathing? N N  Doing errands, shopping? Tempie Donning  Comment family provides transporation  -  Conservation officer, nature and eating ? Y -  Comment family provides -  Using the Toilet? N -  In the past six months, have you accidently leaked urine? Y -  Comment wears depends  -  Do you have problems with loss of bowel control? N -  Managing your Medications? N -    Managing your Finances? Y -  Comment family manages  -  Housekeeping or managing your Housekeeping? Y -  Comment family does housekeeping  -  Some recent data might be hidden    Fall/Depression Screening:    Fall Risk  07/17/2017 12/15/2015 09/23/2015  Falls in the past year? No Yes No  Number falls in past yr: - 1 -  Injury with Fall? - Yes -  Comment - patient states she skinned her right leg.   -  Risk Factor Category  - High Fall Risk -  Risk for fall due to : - Other (Comment) -  Risk for fall due to: Comment - Patient states she missed her step and fell -  Follow up - Falls prevention discussed -   PHQ 2/9 Scores 07/17/2017 07/11/2017 09/23/2015 09/03/2015  PHQ - 2 Score 0 0 0 0    Assessment:  Routine home visit , daughter [name] present.   Heart failure - monitoring weight at least 5 days a week,no sudden increases in weights noted when reviewed. Shortness of activity and relieved after rest.  Red rash abdominal fold/complaint of back and shoulder pain - PCP visit scheduled  Medication - reviewed blister pack of medication noted missing several doses, in last week 3 missed morning does of medication.   Advanced Directive - patient does not have, agreeable to receiving packet.  Discussed care connection program for support of chronic condition of heart failure .    Plan:  Placed call to PCP office to schedule office visit to follow up on rash and shoulder pain . Reviewed with patient last office visit was 5/31, she has office visit scheduled for 8/7 at 10 am. Discussed with daughter concern regarding medication missed does, she will call patient to remind her daily  Provided advanced directive packet and reviewed how to complete with patient and daughter, discussed needed witnesses, and notary, discussed local area that with notarize  Will plan call to care connection program to determine if patient is eligible.  Will send PCP visit note.       THN CM Care Plan Problem  One     Most Recent Value  Care Plan Problem One  knowledge deficit related to congestive heart failure  Role Documenting the Problem One  Care Management Coordinator  Care Plan for Problem One  Active  THN Long Term Goal   Patient will be able to verbalize increase knowledge of Heart failure managment over the next 90 day s  THN Long Term Goal Start Date  07/17/17  Interventions for Problem One Long Term Goal  Advised patient regarding the importance of taking medications as prescribed to control medical conditions, discussed with daughter calling /checking on patient to make sure that she is taking her medications.   THN CM Short Term Goal #1   Patient will verbalize 3 yellow zone symptoms  of heart failure within 30 days  THN CM Short Term Goal #1 Start Date  08/30/17 Barrie Folk restarted ]  Interventions for Short Term Goal #1  Reviewed yellow zone symptoms and action plan , notifying MD/Rn of worsening symptoms   THN CM Short Term Goal #2   Patient will report  she is weighing and recording weights daily wiithin 30 days  THN CM Short Term Goal #2 Start Date  07/17/17  [facility] CM Short Term Goal #2 Met Date  08/30/17      Joylene Draft, RN, [place] Management Coordinator  [phone]- Mobile [phone]- [place] Office

## 2017-08-30 NOTE — Patient Instructions (Signed)
Your physician recommends that you schedule a follow-up appointment in: 6 months with Dr Bensimhon  Do the following things EVERYDAY: 1) Weigh yourself in the morning before breakfast. Write it down and keep it in a log. 2) Take your medicines as prescribed 3) Eat low salt foods-Limit salt (sodium) to 2000 mg per day.  4) Stay as active as you can everyday 5) Limit all fluids for the day to less than 2 liters   

## 2017-08-30 NOTE — Progress Notes (Signed)
Advanced Heart Failure Clinic Note   Patient ID: [name], female   DOB: [birth year], 84 y.o.   MRN: [id number]  HPI: [name] is a delightful 84 year old woman with a history of diastolic HF, chronic atrial fibrillation c/b tachybrady syndrome s/p pacemaker placement in 2010, chronic dyspnea as well as hypertension and gastroesophageal reflux disease.  09/2010   L Main normal LAD mild narrowing in the proximal and midvessel. The first diagonal branch has a 40% ostial stenosis. The left circumflex coronary artery has mild irregularities up to 10- 20%. The right coronary artery is a dominant vessel. It has mild wall irregularities less than or equal to 10%.   Has chronic AF, she saw Dr. Graciela Husbands in past and he noted that nearly 40% of her rates were > 110. Suggested amiodarone vs AV node ablation. We went with amiodarone and she felt much better.   Presented to ED on 10/06/16 with weakness and hypotension found to be in Afib RVR with rates in the 140s.  Improved on Dilt gtt into the 90s. Not felt to be in HF on exam/CXR. She was given 60mg  short-acting Cardizem with drip stopped. Home Dilt was increased from 120mg  daily to 240mg  daily. Continued on home coumadin. She presented back to the ED the following day with weakness, near syncope and hypotension. EKG showed vpaced rhythm, and was hypotensive with systolic pressures in the 70s. Diltiazem stopped. Metoprolol and amiodarone were restarted and titrated up for rate control of her afib.   Todays she returns for HF follow up. Last visit amio was cut back to 200 mg twice a day. Able to go to church. Overall feeling pretty good. Mild dyspnea walking in the house. Denies PND/Orthopnea. Appetite ok. No fever or chills. Weight at home 175  pounds. Taking all medications.   Labs 09/26/12: K 3.5, creatinine 0.84, proBNP 1265          10/09/12: K 3.5, Cr 0.83, AST 21, ALT 11          10/08/2016 K 4.2 Creatinine 1.24   ECHO 07/2009: EF 55-60%, mild MR, LA mod  dilated, RA mild dilated ECHO 10/14: EF 55-60%, biatrial enlargement, normal RV size/systolic function, PA systolic pressure 39 mmHg.  ECHO 7/17:  EF 55-60% RVSP 39 biatrial enlargement.  ECHO 2018: EF 60% to 65%.   SH: Lives in [place] with her great grandaughter  husband, nonsmoker.   ROS: All systems negative except as listed in HPI, PMH and Problem List.  Past Medical History:  Diagnosis Date  . Anticoagulation goal of INR 2 to 3 08/21/2015  . Arthritis    oa  . Balance problem   . Chronic diastolic CHF (congestive heart failure) (HCC)   . Dyspnea   . GERD (gastroesophageal reflux disease)   . Hyperlipidemia   . Hypertension   . Hypotensive episode 08/21/2015  . Mild CAD    a. nonobst CAD by cath 09/2010.  . Obesity   . Osteoporosis   . Pacemaker-[employer] Jude   . Permanent atrial fibrillation (HCC)    a. c/b tachy-brady syndrome s/p PPM 2010.  08/23/2015 Stroke [facility]) 07/2013   Current Outpatient Medications  Medication Sig Dispense Refill  . acetaminophen (TYLENOL) 500 MG tablet Take 500 mg by mouth every 6 (six) hours as needed for mild pain or headache.    [name] amiodarone (PACERONE) 200 MG tablet Take 1 tablet (200 mg total) by mouth 2 (two) times daily. 60 tablet 0  . calcium carbonate (TUMS) 500  MG chewable tablet Chew 1 tablet by mouth daily as needed for indigestion or heartburn.    . carbidopa-levodopa (SINEMET CR) 50-200 MG tablet Take 1 tablet by mouth 2 (two) times daily.    . furosemide (LASIX) 20 MG tablet Take 1 tablet (20 mg total) by mouth daily. 30 tablet 0  . hydrOXYzine (ATARAX/VISTARIL) 10 MG tablet Take 10 mg by mouth every 6 (six) hours as needed for anxiety.    . metoprolol tartrate (LOPRESSOR) 50 MG tablet Take 1 tablet (50 mg total) by mouth 2 (two) times daily. 60 tablet 0  . Rivaroxaban (XARELTO) 15 MG TABS tablet Take 1 tablet (15 mg total) by mouth daily with supper. 30 tablet 0  . rOPINIRole (REQUIP) 0.5 MG tablet Take 0.5 mg by mouth at bedtime.    [name] tiZANidine  (ZANAFLEX) 2 MG tablet Take 2 mg by mouth 2 (two) times daily as needed.      No current facility-administered medications for this encounter.    Vitals:   08/30/17 1451  BP: (!) 156/92  Pulse: 71  SpO2: 96%  Weight: 180 lb 3.2 oz (81.7 kg)   Filed Weights   08/30/17 1451  Weight: 180 lb 3.2 oz (81.7 kg)    PHYSICAL EXAM: General:  Elderly. No resp difficulty. Daughter present  HEENT: normal Neck: supple. no JVD. Carotids 2+ bilat; no bruits. No lymphadenopathy or thryomegaly appreciated. Cor: PMI nondisplaced. Irregular rate & rhythm. No rubs, gallops or murmurs. Lungs: clear Abdomen: soft, nontender, nondistended. No hepatosplenomegaly. No bruits or masses. Good bowel sounds. Extremities: no cyanosis, clubbing, rash, R and LLE trace edema.  Neuro: alert & orientedx3, cranial nerves grossly intact. moves all 4 extremities w/o difficulty. Affect pleasant   ECG : A fib/fluttere  71 bpm   ASSESSMENT & PLAN:  1) Chronic Diastolic HF, EF 55-60% - NYHA III. Volume status stable. Continue current regimen.  2) Chronic A-fib  Rate controlled. Continue amio 200 mg twice a day.  3) Tremors - Stable  3) HTN -Cotninue current regiemn.  4) CVA S/P 2015. -on warfarin & statin   Follow up in 6 months.   Tonye Becket, NP-C  3:27 PM

## 2017-09-05 ENCOUNTER — Other Ambulatory Visit: Payer: Self-pay | Admitting: *Deleted

## 2017-09-05 NOTE — Patient Outreach (Signed)
[facility]) Care Management  09/05/2017  [name] Oct 28, 1923 [id number]   Care Coordination  Call to  [facility] referral department to discuss patient eligibility for care connections program, spoke with Drenda Freeze, she states after review patient will be eligible for program.   Attempted call to patient to discuss, patient answering the phone identified herself but she then states she does not know me after identifying myself and recent visit, unable to verifiy HIPAA information patient hung up phone, patient difficulty with hearing over the phone.    Placed call to daughter [name] , on [facility] consent, lives next door and was present at visit on last week, no answer unable to leave a message, attempted call x 2.  Plan Will plan follow up call in the 3 business days.    Egbert Garibaldi, RN, [facility] Care Management,Care Management Coordinator  [phone]- Mobile [phone]- Toll Free Main Office

## 2017-09-07 ENCOUNTER — Other Ambulatory Visit: Payer: Self-pay | Admitting: *Deleted

## 2017-09-07 NOTE — Patient Outreach (Signed)
[facility]) Care Management  09/07/2017  TIMIYAH ROMITO August 16, 1923 [id number]   Care Coordination   Referral received : 07/10/17 Referral source: [facility] telephonic CM, Dr. Yetta Flock  Referral reason : Concern whether Patient taking medication correctly , Nursing evaluation at home/support for family Insurance: [facility]   Successful outreach call to patient ,confirmed HIPAA identifiers x 2.  Patient discussed feeling pretty good, shared her birthday trip to [place] on this week.  Discussed my recent visit to her home on last week with her daughter Zettie Gootee present and discussion on Care Connection program for support, patient able to recall and states she is agreeable to all help she can get. Patient states she will be at home most of time , but to call her daughter [name] .   Placed call to [name] , confirmed HIPAA x 2 identifiers , reviewed discussion on last visit regarding care connection program for support of chronic medical conditions, reviewed with [name] that patient would be eligible for program and representative will contact patient PCP office to get orders and then place call to set up a visit with patient. [name] states agency should call her to set up visit and gave verbal okay to provide her contact number.  [name] discussed that patient cancelled her appointment with PCP on this week, and states patient has improvement of redden area at abdomen. [name] reports she has visited patient this morning ( she lives next door) and she was doing alright, she also discussed patient is forgetful at times, she visits a couple times a day to help remind about medications.   Placed call to Drenda Freeze at [facility] report patient and daughter agreement for program. Provided her with [name] contact number.    Plan  Will follow up in the next week to confirm patient enrollment in Care Connections  program.  Egbert Garibaldi, RN, [facility] Care Management,Care Management  Coordinator  [phone]- Mobile [phone]- Toll Free Main Office

## 2017-09-14 ENCOUNTER — Ambulatory Visit: Payer: Self-pay | Admitting: *Deleted

## 2017-09-14 ENCOUNTER — Other Ambulatory Visit: Payer: Self-pay | Admitting: *Deleted

## 2017-09-14 NOTE — Patient Outreach (Signed)
[facility]) Care Management  09/14/2017  [name] [birth year] [id number]   Referral received : 07/10/17 Referral source: [facility] telephonic CM, Dr. Yetta Flock  Referral reason : Concern whether Patient taking medication correctly , Nursing evaluation at home/support for family Insurance: [facility] coordination    Outreach call to [place] at [facility] to discuss patient enrollment, reports waiting order from MD office.   Placed call to patient daughter Desree Leap to update on progress regarding referral to care connection program,no answer unable to leave a message.  Placed call to patient no answer unable to leave a message   Plan  Placed care coordination call to Dr. Yetta Flock office to discuss patient would benefit from support from Care Connection program, nurse states that she spoke with Drenda Freeze from Palliative care on today and gave orders .  Will plan outreach call/Care Coordination call in the next week to follow up on care connections plan Will send unsuccessful outreach letter and plan return call in the next 4 business days.    Egbert Garibaldi, RN, [facility] Care Management,Care Management Coordinator  [phone]- Mobile [phone]- Toll Free Main Office

## 2017-09-19 DIAGNOSIS — I129 Hypertensive chronic kidney disease with stage 1 through stage 4 chronic kidney disease, or unspecified chronic kidney disease: Secondary | ICD-10-CM | POA: Diagnosis not present

## 2017-09-19 DIAGNOSIS — E785 Hyperlipidemia, unspecified: Secondary | ICD-10-CM | POA: Diagnosis not present

## 2017-09-19 DIAGNOSIS — R7303 Prediabetes: Secondary | ICD-10-CM | POA: Diagnosis not present

## 2017-09-20 ENCOUNTER — Other Ambulatory Visit: Payer: Self-pay | Admitting: *Deleted

## 2017-09-20 NOTE — Patient Outreach (Signed)
[facility]) Care Management  09/20/2017  [name] [birth year] [id number]   Care Coordination call  Placed call to Drenda Freeze at [facility] to follow up on referral to program, she reports they have home visit with patient on today.    1600  Return call from Drenda Freeze she reports patient has signed for Care Connections program as of today active with services.    Plan  Will plan outreach call to patient/daughter on this week regarding case closure as care will be managed by Care Connections program.    Egbert Garibaldi, RN, [facility] Care Management,Care Management Coordinator  [phone]- Mobile [phone]- Toll Free Main Office

## 2017-09-22 ENCOUNTER — Other Ambulatory Visit: Payer: Self-pay | Admitting: *Deleted

## 2017-09-22 NOTE — Patient Outreach (Signed)
[facility]) Care Management  09/22/2017  [name] 1923-07-23 [id number]  Case Closure   Successful outreach call to patient , HIPAA verified. Patient reports feeling fairly well on today.   Discussed with patient her recent visit with [facility], she confirmed she signed up for the program, and looking for the the attention she will get, reports they will visit her each week, help her with getting a wheelchair.  Discussed since [facility] will be managing her care , [facility] care management would be duplication of services and will close her case she voiced understanding and states she will miss my visit.   Plan Will close case , Patient care will be managed by external program of care connections Will send PCP case closure letter.    Egbert Garibaldi, RN, [facility] Care Management,Care Management Coordinator  [phone]- Mobile [phone]- Toll Free Main Office

## 2017-09-29 DIAGNOSIS — R000 Tachycardia, unspecified: Secondary | ICD-10-CM | POA: Diagnosis not present

## 2017-09-29 DIAGNOSIS — I509 Heart failure, unspecified: Secondary | ICD-10-CM | POA: Diagnosis not present

## 2017-09-29 DIAGNOSIS — Z6833 Body mass index (BMI) 33.0-33.9, adult: Secondary | ICD-10-CM | POA: Diagnosis not present

## 2017-09-29 DIAGNOSIS — I4891 Unspecified atrial fibrillation: Secondary | ICD-10-CM | POA: Diagnosis not present

## 2017-11-17 ENCOUNTER — Encounter: Payer: Self-pay | Admitting: Cardiology

## 2018-01-12 ENCOUNTER — Other Ambulatory Visit (HOSPITAL_COMMUNITY): Payer: Self-pay | Admitting: Internal Medicine

## 2018-01-22 ENCOUNTER — Emergency Department (HOSPITAL_COMMUNITY): Payer: Medicare HMO

## 2018-01-22 ENCOUNTER — Encounter (HOSPITAL_COMMUNITY): Payer: Self-pay

## 2018-01-22 ENCOUNTER — Emergency Department (HOSPITAL_COMMUNITY)
Admission: EM | Admit: 2018-01-22 | Discharge: 2018-01-22 | Disposition: A | Discharge status: Home / Self Care | Payer: Medicare HMO | Attending: Emergency Medicine | Admitting: Emergency Medicine

## 2018-01-22 DIAGNOSIS — W07XXXA Fall from chair, initial encounter: Secondary | ICD-10-CM | POA: Diagnosis not present

## 2018-01-22 DIAGNOSIS — R0602 Shortness of breath: Secondary | ICD-10-CM | POA: Diagnosis not present

## 2018-01-22 DIAGNOSIS — I2510 Atherosclerotic heart disease of native coronary artery without angina pectoris: Secondary | ICD-10-CM | POA: Insufficient documentation

## 2018-01-22 DIAGNOSIS — I4891 Unspecified atrial fibrillation: Secondary | ICD-10-CM | POA: Diagnosis not present

## 2018-01-22 DIAGNOSIS — R918 Other nonspecific abnormal finding of lung field: Secondary | ICD-10-CM | POA: Insufficient documentation

## 2018-01-22 DIAGNOSIS — I10 Essential (primary) hypertension: Secondary | ICD-10-CM | POA: Insufficient documentation

## 2018-01-22 DIAGNOSIS — S0990XA Unspecified injury of head, initial encounter: Secondary | ICD-10-CM | POA: Diagnosis not present

## 2018-01-22 DIAGNOSIS — R072 Precordial pain: Secondary | ICD-10-CM | POA: Diagnosis not present

## 2018-01-22 DIAGNOSIS — R0789 Other chest pain: Secondary | ICD-10-CM

## 2018-01-22 DIAGNOSIS — I499 Cardiac arrhythmia, unspecified: Secondary | ICD-10-CM | POA: Diagnosis not present

## 2018-01-22 DIAGNOSIS — Z95811 Presence of heart assist device: Secondary | ICD-10-CM | POA: Diagnosis not present

## 2018-01-22 DIAGNOSIS — Z79899 Other long term (current) drug therapy: Secondary | ICD-10-CM | POA: Diagnosis not present

## 2018-01-22 DIAGNOSIS — Z7901 Long term (current) use of anticoagulants: Secondary | ICD-10-CM | POA: Diagnosis not present

## 2018-01-22 DIAGNOSIS — R079 Chest pain, unspecified: Secondary | ICD-10-CM | POA: Diagnosis not present

## 2018-01-22 DIAGNOSIS — R911 Solitary pulmonary nodule: Secondary | ICD-10-CM | POA: Diagnosis not present

## 2018-01-22 DIAGNOSIS — S299XXA Unspecified injury of thorax, initial encounter: Secondary | ICD-10-CM | POA: Diagnosis not present

## 2018-01-22 DIAGNOSIS — R0902 Hypoxemia: Secondary | ICD-10-CM | POA: Diagnosis not present

## 2018-01-22 LAB — BASIC METABOLIC PANEL
Anion gap: 12 (ref 5–15)
BUN: 14 mg/dL (ref 8–23)
CO2: 24 mmol/L (ref 22–32)
Calcium: 8.6 mg/dL — ABNORMAL LOW (ref 8.9–10.3)
Chloride: 104 mmol/L (ref 98–111)
Creatinine, Ser: 1.27 mg/dL — ABNORMAL HIGH (ref 0.44–1.00)
GFR calc Af Amer: 42 mL/min — ABNORMAL LOW (ref >60)
GFR calc non Af Amer: 37 mL/min — ABNORMAL LOW (ref >60)
Glucose, Bld: 102 mg/dL — ABNORMAL HIGH (ref 70–99)
POTASSIUM: 3.7 mmol/L (ref 3.5–5.1)
Sodium: 140 mmol/L (ref 135–145)

## 2018-01-22 LAB — CBC
HCT: 48.6 % — ABNORMAL HIGH (ref 36.0–46.0)
HEMOGLOBIN: 15.6 g/dL — AB (ref 12.0–15.0)
MCH: 31.5 pg (ref 26.0–34.0)
MCHC: 32.1 g/dL (ref 30.0–36.0)
MCV: 98.2 fL (ref 80.0–100.0)
Platelets: 273 10*3/uL (ref 150–400)
RBC: 4.95 MIL/uL (ref 3.87–5.11)
RDW: 14 % (ref 11.5–15.5)
WBC: 9.5 10*3/uL (ref 4.0–10.5)
nRBC: 0 % (ref 0.0–0.2)

## 2018-01-22 LAB — I-STAT TROPONIN, ED: Troponin i, poc: 0.01 ng/mL (ref 0.00–0.08)

## 2018-01-22 LAB — BRAIN NATRIURETIC PEPTIDE: B Natriuretic Peptide: 296.1 pg/mL — ABNORMAL HIGH (ref 0.0–100.0)

## 2018-01-22 LAB — TROPONIN I: Troponin I: <0.03 ng/mL (ref <0.03)

## 2018-01-22 LAB — D-DIMER, QUANTITATIVE: D-Dimer, Quant: 3.09 ug/mL-FEU — ABNORMAL HIGH (ref 0.00–0.50)

## 2018-01-22 MED ORDER — IOPAMIDOL (ISOVUE-370) INJECTION 76%
100.0000 mL | Freq: Once | INTRAVENOUS | Status: AC | PRN
  Administered 2018-01-22: 100 mL via INTRAVENOUS

## 2018-01-22 MED ORDER — ACETAMINOPHEN 325 MG PO TABS: 650.0000 mg | ORAL_TABLET | Freq: Once | ORAL | Status: DC | PRN

## 2018-01-22 MED ORDER — DOXYCYCLINE HYCLATE 100 MG PO TABS
100.0000 mg | ORAL_TABLET | Freq: Once | ORAL | Status: AC
  Administered 2018-01-22: 100 mg via ORAL
  Filled 2018-01-22: qty 1

## 2018-01-22 MED ORDER — DOXYCYCLINE HYCLATE 100 MG PO CAPS: 100.0000 mg | ORAL_CAPSULE | Freq: Two times a day (BID) | ORAL | 0 refills | Status: AC

## 2018-01-22 NOTE — ED Provider Notes (Signed)
[facility] EMERGENCY DEPARTMENT Provider Note   CSN: [id number] Arrival date & time: 01/22/18  1329     History   Chief Complaint Chief Complaint  Patient presents with  . Chest Pain  . Fall    HPI [name] is a 84 y.o. female.  HPI  83 year old female presents with chest pain.  It is sharp and mostly left-sided.  Started last night as well as having a little bit of cough.  She has chronic dyspnea, not sure if it is worse.  She fell this morning off of a chair.  She thinks the chair gave out from under her.  She fell backwards on her back but does not think she hit her head.  She is on Eliquis.  Niece at the bedside states that she has been missing some doses of medicines in the morning but not at night.  She is not sure what time she takes the Eliquis or her other meds.  The patient has chronic atrial fibrillation.  Her legs have been swollen chronically but this appears a little improved from baseline.  The patient states that deep breaths make the pain worse.  It is sharp but not severe.  She declines pain medicine.  Past Medical History:  Diagnosis Date  . Anticoagulation goal of INR 2 to 3 08/21/2015  . Arthritis    oa  . Balance problem   . Chronic diastolic CHF (congestive heart failure) (HCC)   . Dyspnea   . GERD (gastroesophageal reflux disease)   . Hyperlipidemia   . Hypertension   . Hypotensive episode 08/21/2015  . Mild CAD    a. nonobst CAD by cath 09/2010.  . Obesity   . Osteoporosis   . Pacemaker-St[employer] Jude   . Permanent atrial fibrillation (HCC)    a. c/b tachy-brady syndrome s/p PPM 2010.  [name] Stroke [facility]) 07/2013    Patient Active Problem List   Diagnosis Date Noted  . Hypotension 10/06/2016  . Sepsis (HCC) 09/06/2016  . Cellulitis 09/06/2016  . Atrial fibrillation with RVR (HCC) 09/06/2016  . Tremor 09/06/2016  . Anticoagulation goal of INR 2 to 3 08/21/2015  . Hypotensive episode 08/21/2015  . Pain in the chest   .  Accelerated hypertension 08/18/2015  . Elevated troponin- for one draw- may be spurious 08/18/2015  . Vertigo 04/11/2014  . Chest pain 03/21/2014  . Viral syndrome 03/21/2014  . Nausea 03/21/2014  . Diarrhea 03/21/2014  . Pulmonary nodule 03/21/2014  . CVA (cerebral infarction) 08/21/2013  . TIA (transient ischemic attack) 08/21/2013  . Shortness of breath 04/10/2013  . Cardiac pacemaker-St. Lauralee [name] 03/05/2013  . Chronic diastolic heart failure (HCC) 10/18/2011  . Chronic kidney disease 09/06/2010  . CVA 07/07/2009  . SICK SINUS SYNDROME 02/03/2009  . SHOULDER PAIN, LEFT, CHRONIC 02/03/2009  . INF&INFLAM REACT DUE CARD DEVICE IMPLANT&GRAFT 10/20/2008  . HYPERLIPIDEMIA-MIXED 06/04/2008  . Essential hypertension 06/04/2008  . Chronic atrial fibrillation with RVR 06/04/2008    Past Surgical History:  Procedure Laterality Date  . ABDOMINAL HYSTERECTOMY    . BREAST LUMPECTOMY Right   . CARDIAC CATHETERIZATION    . COLECTOMY  1991  . INSERT / REPLACE / REMOVE PACEMAKER     [employer] Jude  . SPLENECTOMY    . TOTAL ABDOMINAL HYSTERECTOMY W/ BILATERAL SALPINGOOPHORECTOMY  1974     OB History   No obstetric history on file.      Home Medications    Prior to Admission medications  Medication Sig Start Date End Date Taking? Authorizing Provider  acetaminophen (TYLENOL) 500 MG tablet Take 500 mg by mouth every 6 (six) hours as needed for mild pain or headache.   Yes [provider]  amiodarone (PACERONE) 200 MG tablet Take 1 tablet (200 mg total) by mouth 2 (two) times daily. 08/07/17  Yes Bensimhon, Bevelyn Buckles, MD  calcium carbonate (TUMS) 500 MG chewable tablet Chew 1 tablet by mouth daily as needed for indigestion or heartburn.   Yes [provider]  carbidopa-levodopa (SINEMET CR) 50-200 MG tablet Take 1 tablet by mouth 2 (two) times daily. 08/10/15  Yes [provider]  furosemide (LASIX) 20 MG tablet TAKE 1 TABLET BY MOUTH DAILY. Patient taking differently:  Take 20 mg by mouth daily.  01/12/18  Yes Bensimhon, Bevelyn Buckles, MD  metoprolol tartrate (LOPRESSOR) 50 MG tablet TAKE 1 TABLET BY MOUTH TWICE DAILY. Patient taking differently: Take 50 mg by mouth 2 (two) times daily.  01/12/18  Yes Bensimhon, Bevelyn Buckles, MD  Rivaroxaban (XARELTO) 15 MG TABS tablet Take 1 tablet (15 mg total) by mouth daily with supper. 08/07/17  Yes Bensimhon, Bevelyn Buckles, MD  rOPINIRole (REQUIP) 0.5 MG tablet Take 0.5 mg by mouth at bedtime.   Yes [provider]    Family History Family History  Problem Relation Age of Onset  . Cancer Other   . Heart disease Other   . Hypertension Other     Social History Social History   Tobacco Use  . Smoking status: Never Smoker  . Smokeless tobacco: Never Used  Substance Use Topics  . Alcohol use: Not on file  . Drug use: Not on file     Allergies   Lipitor [atorvastatin] and Morphine and related   Review of Systems Review of Systems  Constitutional: Negative for fever.  Respiratory: Positive for cough and shortness of breath.   Cardiovascular: Positive for chest pain and leg swelling.  Gastrointestinal: Negative for abdominal pain and vomiting.  Musculoskeletal: Negative for back pain.  Neurological: Negative for headaches.  All other systems reviewed and are negative.    Physical Exam Updated Vital Signs BP (!) 139/99   Pulse 70   Temp 97.9 F (36.6 C) (Oral)   Resp 20   Ht 5\' 6"  (1.676 m)   Wt 79.8 kg   SpO2 92%   BMI 28.41 kg/m   Physical Exam Vitals signs and nursing note reviewed.  Constitutional:      Appearance: She is well-developed.  HENT:     Head: Normocephalic and atraumatic.     Comments: No obvious external signs of trauma.     Right Ear: External ear normal.     Left Ear: External ear normal.     Nose: Nose normal.  Eyes:     General:        Right eye: No discharge.        Left eye: No discharge.  Cardiovascular:     Rate and Rhythm: Normal rate. Rhythm irregular.      Heart sounds: Normal heart sounds.  Pulmonary:     Effort: Pulmonary effort is normal.     Breath sounds: Rales present.  Abdominal:     Palpations: Abdomen is soft.     Tenderness: There is no abdominal tenderness.  Musculoskeletal:     Comments: Significant kyphosis. No focal back tenderness or pain  Skin:    General: Skin is warm and dry.  Neurological:     Mental Status:  She is alert.     Comments: No facial droop or slurred speech. 5/5 strength in all 4 extremities. Grossly normal sensation.   Psychiatric:        Mood and Affect: Mood is not anxious.      ED Treatments / Results  Labs (all labs ordered are listed, but only abnormal results are displayed) Labs Reviewed  BASIC METABOLIC PANEL - Abnormal; Notable for the following components:      Result Value   Glucose, Bld 102 (*)    Creatinine, Ser 1.27 (*)    Calcium 8.6 (*)    GFR calc non Af Amer 37 (*)    GFR calc Af Amer 42 (*)    All other components within normal limits  CBC - Abnormal; Notable for the following components:   Hemoglobin 15.6 (*)    HCT 48.6 (*)    All other components within normal limits  D-DIMER, QUANTITATIVE (NOT AT [facility]) - Abnormal; Notable for the following components:   D-Dimer, Quant 3.09 (*)    All other components within normal limits  BRAIN NATRIURETIC PEPTIDE  I-STAT TROPONIN, ED    EKG EKG Interpretation  Date/Time:  Monday January 22 2018 15:50:20 EST Ventricular Rate:  84 PR Interval:    QRS Duration: 90 QT Interval:  417 QTC Calculation: 493 R Axis:   79 Text Interpretation:  Atrial fibrillation Low voltage, precordial leads Borderline prolonged QT interval Confirmed by Pricilla Loveless [phone]) on 01/22/2018 4:47:44 PM   Radiology Dg Chest 2 View  Result Date: 01/22/2018 CLINICAL DATA:  Chest heaviness. EXAM: CHEST - 2 VIEW COMPARISON:  10/06/2016. FINDINGS: Cardiac pacer with lead tips over the right atrium right ventricle. Cardiomegaly with diffuse mild  bilateral pulmonary interstitial prominence. Mild CHF could present this fashion. No pleural effusion or pneumothorax. Diffuse thoracic osteopenia degenerative change. Mild thoracic vertebral body compression fractures again noted. IMPRESSION: 1. Cardiac pacer with lead tips projected over the right atrium right ventricle. 2. Cardiomegaly with mild bilateral pulmonary interstitial prominence suggesting mild CHF. Electronically Signed   By: Maisie Fus  Register   On: 01/22/2018 14:45   Ct Head Wo Contrast  Result Date: 01/22/2018 CLINICAL DATA:  Head trauma EXAM: CT HEAD WITHOUT CONTRAST TECHNIQUE: Contiguous axial images were obtained from the base of the skull through the vertex without intravenous contrast. COMPARISON:  10/31/2013 FINDINGS: Brain: No evidence of acute infarction, hemorrhage, extra-axial collection, ventriculomegaly, or mass effect. Dystrophic calcification in the left cerebellum. Generalized cerebral atrophy. Periventricular white matter low attenuation likely secondary to microangiopathy. Vascular: Cerebrovascular atherosclerotic calcifications are noted. Skull: Negative for fracture or focal lesion. Sinuses/Orbits: Visualized portions of the orbits are unremarkable. Visualized portions of the paranasal sinuses and mastoid air cells are unremarkable. Other: None. IMPRESSION: 1. No acute intracranial pathology. 2. Chronic microvascular disease and cerebral atrophy. Electronically Signed   By: Elige Ko   On: 01/22/2018 15:11    Procedures Procedures (including critical care time)  Medications Ordered in ED Medications  acetaminophen (TYLENOL) tablet 650 mg (has no administration in time range)     Initial Impression / Assessment and Plan / ED Course  I have reviewed the triage vital signs and the nursing notes.  Pertinent labs & imaging results that were available during my care of the patient were reviewed by me and considered in my medical decision making (see chart for  details).     Patient's CP is atypical. Some cough, maybe some mild pulmonary edema on CXR. Given pleuritic pain, and  unclear how well she takes eliquis, will do PE workup. Positive Ddimer. She fell but no injuries. Given eliquis and age, ct head obtained and is negative. I talked with patient/family, they would prefer to rule out MI but wouldn't want invasive workup otherwise. If PE CT negative and 2nd trop negative, I think she can be discharged home. Care to Dr. Particia Nearing.  Final Clinical Impressions(s) / ED Diagnoses   Final diagnoses:  None    ED Discharge Orders    None       Pricilla Loveless, MD 01/22/18 1649

## 2018-01-22 NOTE — ED Provider Notes (Signed)
Pt signed out from Dr. Criss Alvine pending the results of 2nd troponin and CT chest.    IMPRESSION:  1. No evidence of acute pulmonary embolism.  2. New/enlarging bilateral pulmonary nodules suspicious for  metastatic disease. Some of these are ill-defined and potentially  inflammatory. Small mediastinal and hilar lymph nodes are  nonspecific and potentially reactive. Non-contrast chest CT at 3  months is recommended. If the nodules are stable at time of repeat  CT, then future CT at 18-24 months (from today's scan) is considered  optional for low-risk patients, but is recommended for high-risk  patients. This recommendation follows the consensus statement:  Guidelines for Management of Incidental Pulmonary Nodules Detected  on CT Images: From the Fleischner Society 2017; Radiology 2017;  284:228-243.  3. Moderate-sized hiatal hernia. Aortic Atherosclerosis  (ICD10-I70.0).     Family told of the pulmonary nodules and possibility of metastatic cancer.  We will treat with doxy now as several lymph nodes may be reactive and nodules may be inflammatory.  The pt is feeling much better and is ready to go home.  Return if worse.   Jacalyn Lefevre, MD 01/22/18 [phone]

## 2018-01-22 NOTE — Discharge Instructions (Signed)
Repeat CT scan of Chest in 3 months to further evaluate the pulmonary nodules.

## 2018-01-22 NOTE — ED Notes (Signed)
Patient verbalizes understanding of discharge instructions. Opportunity for questioning and answers were provided. Pt discharged from ED. 

## 2018-01-22 NOTE — ED Triage Notes (Addendum)
Pt from home via ems; c/o central cp for approx 2 hours, 5/10, pressure; pt endorses "sharp, sudden" chest pain when she takes a deep breath; per family, pt had a mechanical fall this morning, "the chair fell over"; pt denies hitting head, no loc; hx afib; pt has pacemaker; hypotensive initially w/ ems, 86/58; pt had a stroke in September (per family, pt has no deficits); per family, pt is at baseline; endorses non-productive cough that started last night, denies fever; 150 cc NS PTA; 324 ASA; pt on xarelto  156/92 HR 103 RR 18 96% 3L (normally on RA, ,88% RA with FD) CBG 159

## 2018-01-22 NOTE — ED Notes (Signed)
CT notified pt is ready for transport. 

## 2018-01-29 IMAGING — CR DG TIBIA/FIBULA 2V*L*
4 series · 4 of 4 positions shown · non-contrast
Comparison: Left knee radiographs performed 07/06/2014

CLINICAL DATA: Acute onset of left lower leg pain, erythema and
swelling. Initial encounter.

EXAM:
LEFT TIBIA AND FIBULA - 2 VIEW

[tibia ap (1 of 2)]
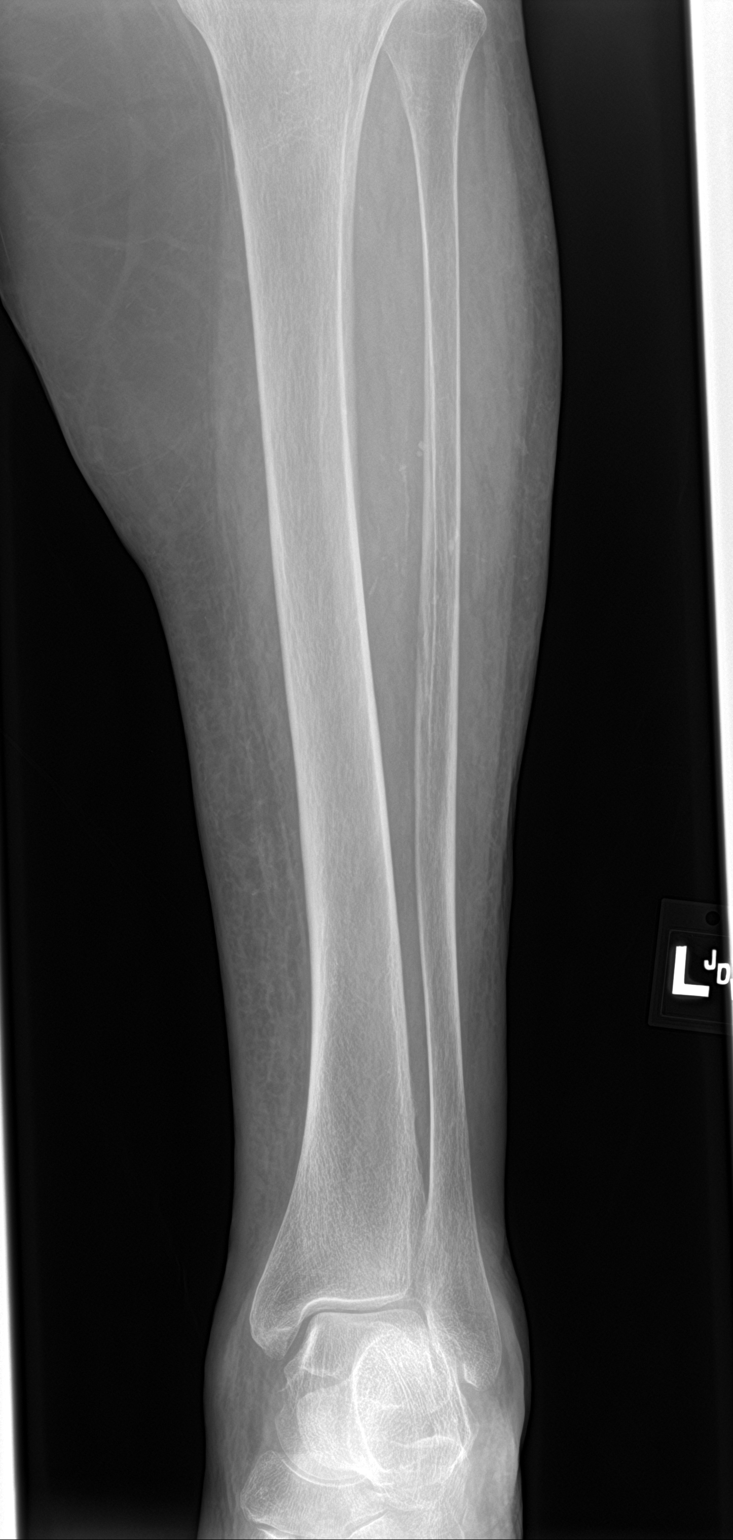

[tibia ap (2 of 2)]
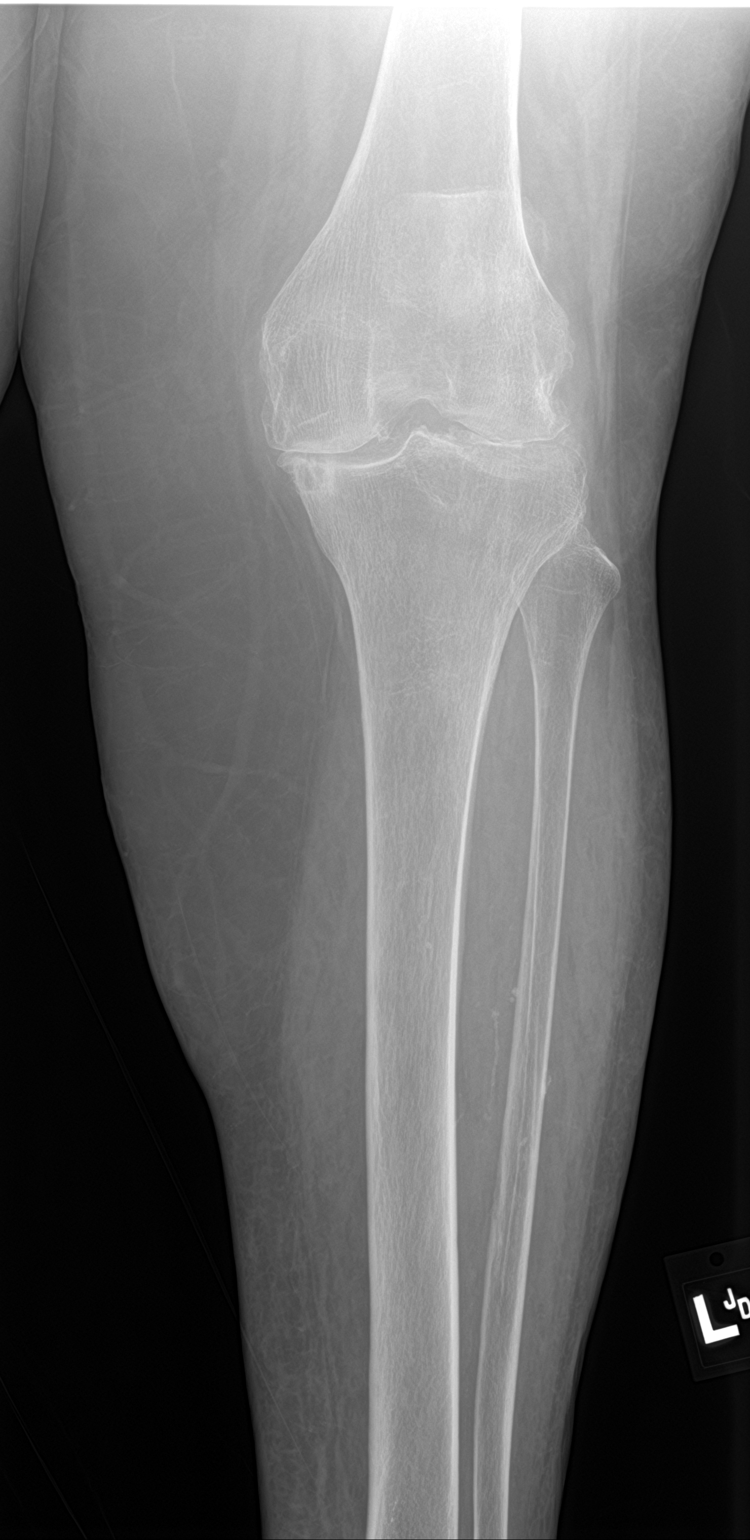

[tibia lat (1 of 2)]
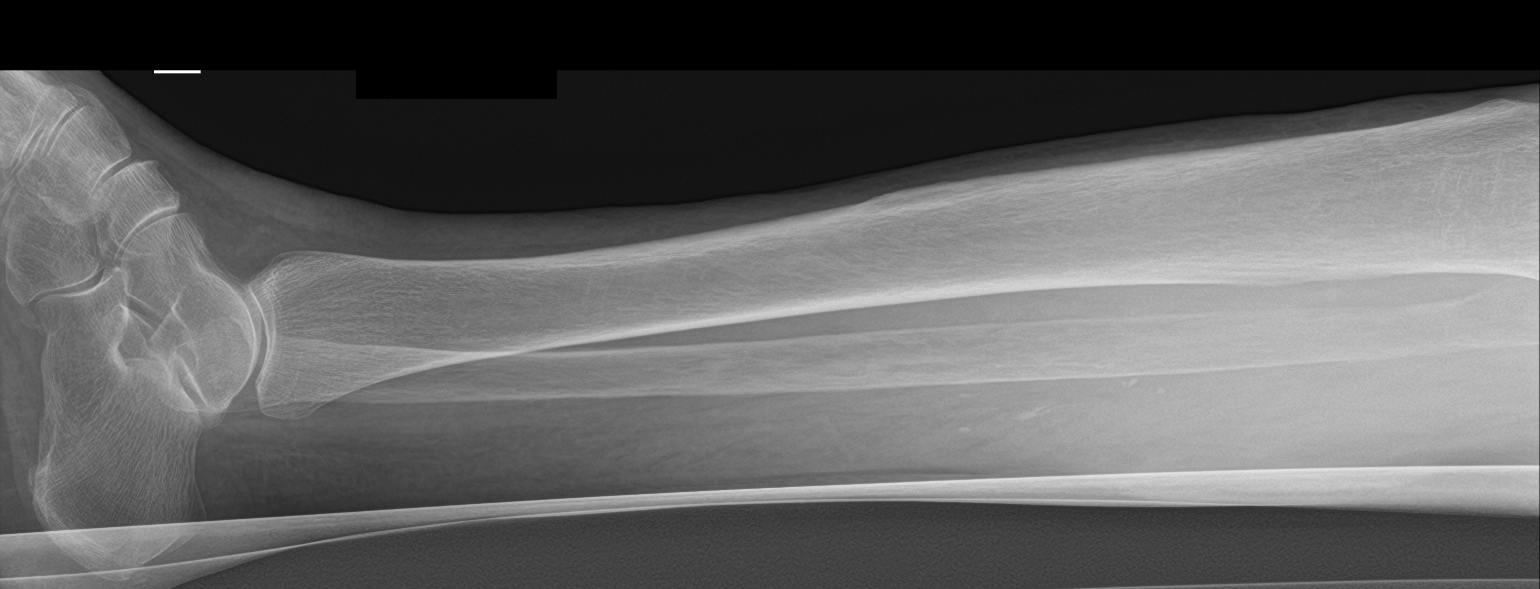

[tibia lat (2 of 2)]
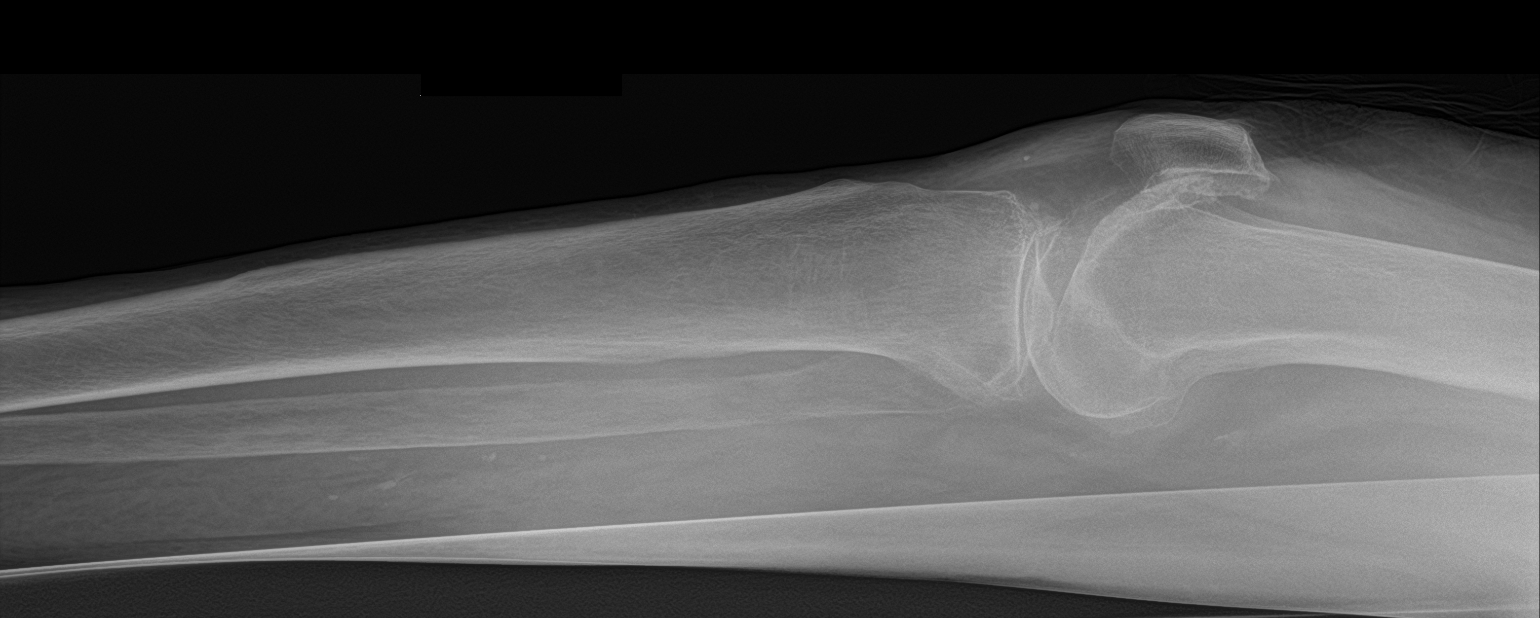

[4 of 4 positions shown; findings below may reference images not displayed]

FINDINGS: There is no evidence of fracture or dislocation. The left tibia and
fibula appear intact.

Mild diffuse joint space narrowing is noted at the knee, with
prominent subcortical cysts at the medial tibial plateau. No
significant knee joint effusion is identified. Medial soft tissue
swelling is noted about the lower leg. The ankle mortise appears
grossly intact.
IMPRESSION: 1. No evidence of fracture or dislocation.
2. Tricompartmental osteoarthritis at the left knee, most prominent
at the medial compartment.

## 2018-01-29 IMAGING — DX DG CHEST 1V PORT
1 series · 1 of 1 positions shown · non-contrast
Comparison: None.

CLINICAL DATA: Cellulitis, left-sided sharp chest pain yesterday.
Chronic dyspnea.

EXAM:
PORTABLE CHEST 1 VIEW

[chest ap]
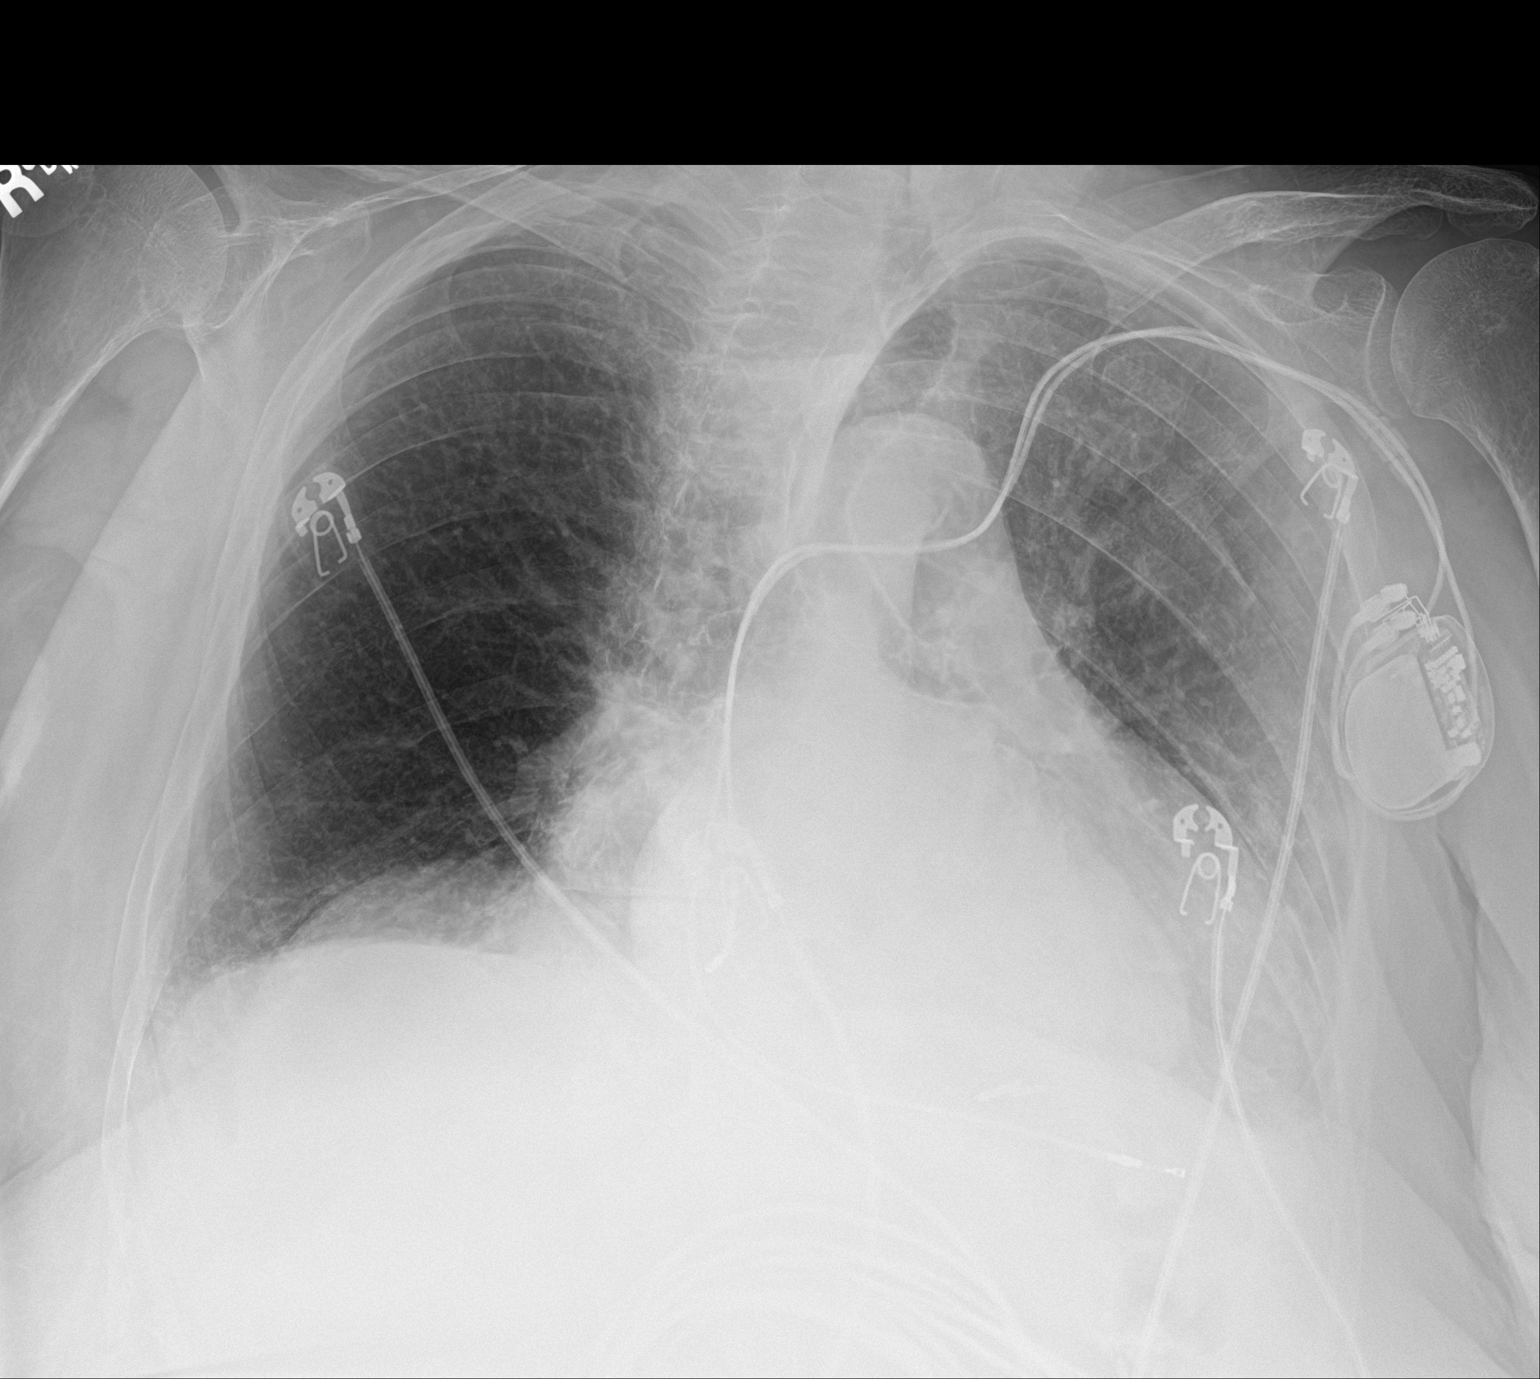

[1 of 1 positions shown; findings below may reference images not displayed]

FINDINGS: A large hiatal hernia projects over the cardiac silhouette. There is
aortic atherosclerosis at the arch with uncoiling of the thoracic
aorta. The heart is enlarged but stable. There is mild interstitial
edema. Probable tiny pleural effusions bilaterally. Patient is
rotated on current exam. Left-sided pacemaker apparatus projects
over the periphery of the left hemithorax with right atrial and
right ventricular leads identified. No acute nor suspicious osseous
abnormality. Degenerative changes are seen along dorsal spine.
IMPRESSION: 1. Stable cardiomegaly and aortic atherosclerosis.
2. Mild interstitial edema with trace bilateral pleural effusions.
3. Large hiatal hernia.

## 2018-02-08 DIAGNOSIS — W19XXXA Unspecified fall, initial encounter: Secondary | ICD-10-CM | POA: Diagnosis not present

## 2018-02-08 DIAGNOSIS — R52 Pain, unspecified: Secondary | ICD-10-CM | POA: Diagnosis not present

## 2018-02-08 DIAGNOSIS — M5489 Other dorsalgia: Secondary | ICD-10-CM | POA: Diagnosis not present

## 2018-02-09 DIAGNOSIS — S299XXA Unspecified injury of thorax, initial encounter: Secondary | ICD-10-CM | POA: Diagnosis not present

## 2018-02-09 DIAGNOSIS — M545 Low back pain: Secondary | ICD-10-CM | POA: Diagnosis not present

## 2018-02-09 DIAGNOSIS — S3992XA Unspecified injury of lower back, initial encounter: Secondary | ICD-10-CM | POA: Diagnosis not present

## 2018-02-09 DIAGNOSIS — R0781 Pleurodynia: Secondary | ICD-10-CM | POA: Diagnosis not present

## 2018-02-09 DIAGNOSIS — S2231XA Fracture of one rib, right side, initial encounter for closed fracture: Secondary | ICD-10-CM | POA: Diagnosis not present

## 2018-03-19 DIAGNOSIS — R634 Abnormal weight loss: Secondary | ICD-10-CM | POA: Diagnosis not present

## 2018-03-19 DIAGNOSIS — R630 Anorexia: Secondary | ICD-10-CM | POA: Diagnosis not present

## 2018-03-20 DIAGNOSIS — Z950 Presence of cardiac pacemaker: Secondary | ICD-10-CM | POA: Diagnosis not present

## 2018-03-20 DIAGNOSIS — Z515 Encounter for palliative care: Secondary | ICD-10-CM | POA: Diagnosis not present

## 2018-03-20 DIAGNOSIS — I4891 Unspecified atrial fibrillation: Secondary | ICD-10-CM | POA: Diagnosis not present

## 2018-03-20 DIAGNOSIS — R630 Anorexia: Secondary | ICD-10-CM | POA: Diagnosis not present

## 2018-03-20 DIAGNOSIS — I5032 Chronic diastolic (congestive) heart failure: Secondary | ICD-10-CM | POA: Diagnosis not present

## 2018-03-20 DIAGNOSIS — I2510 Atherosclerotic heart disease of native coronary artery without angina pectoris: Secondary | ICD-10-CM | POA: Diagnosis not present

## 2018-05-02 DEATH — deceased
